# Patient Record
Sex: Female | Born: 1945
Health system: Southern US, Community
[De-identification: ages and names within clinical notes are randomized; demographics above are authoritative.]

## PROBLEM LIST (undated history)

## (undated) DIAGNOSIS — D649 Anemia, unspecified: Secondary | ICD-10-CM

## (undated) DIAGNOSIS — E785 Hyperlipidemia, unspecified: Secondary | ICD-10-CM

## (undated) DIAGNOSIS — F32A Depression, unspecified: Secondary | ICD-10-CM

## (undated) DIAGNOSIS — F329 Major depressive disorder, single episode, unspecified: Secondary | ICD-10-CM

## (undated) DIAGNOSIS — K3184 Gastroparesis: Secondary | ICD-10-CM

## (undated) DIAGNOSIS — C50919 Malignant neoplasm of unspecified site of unspecified female breast: Secondary | ICD-10-CM

## (undated) DIAGNOSIS — M1990 Unspecified osteoarthritis, unspecified site: Secondary | ICD-10-CM

## (undated) DIAGNOSIS — C229 Malignant neoplasm of liver, not specified as primary or secondary: Secondary | ICD-10-CM

## (undated) DIAGNOSIS — Z5189 Encounter for other specified aftercare: Secondary | ICD-10-CM

## (undated) DIAGNOSIS — K219 Gastro-esophageal reflux disease without esophagitis: Secondary | ICD-10-CM

## (undated) DIAGNOSIS — E119 Type 2 diabetes mellitus without complications: Secondary | ICD-10-CM

## (undated) DIAGNOSIS — G3184 Mild cognitive impairment, so stated: Secondary | ICD-10-CM

## (undated) DIAGNOSIS — Z85528 Personal history of other malignant neoplasm of kidney: Secondary | ICD-10-CM

## (undated) DIAGNOSIS — Z86010 Personal history of colonic polyps: Secondary | ICD-10-CM

## (undated) DIAGNOSIS — G709 Myoneural disorder, unspecified: Secondary | ICD-10-CM

## (undated) DIAGNOSIS — F419 Anxiety disorder, unspecified: Secondary | ICD-10-CM

## (undated) DIAGNOSIS — H269 Unspecified cataract: Secondary | ICD-10-CM

## (undated) DIAGNOSIS — Z853 Personal history of malignant neoplasm of breast: Secondary | ICD-10-CM

## (undated) DIAGNOSIS — R0902 Hypoxemia: Secondary | ICD-10-CM

## (undated) DIAGNOSIS — D3A090 Benign carcinoid tumor of the bronchus and lung: Secondary | ICD-10-CM

## (undated) DIAGNOSIS — T7840XA Allergy, unspecified, initial encounter: Secondary | ICD-10-CM

## (undated) DIAGNOSIS — K5790 Diverticulosis of intestine, part unspecified, without perforation or abscess without bleeding: Secondary | ICD-10-CM

## (undated) DIAGNOSIS — J189 Pneumonia, unspecified organism: Secondary | ICD-10-CM

## (undated) DIAGNOSIS — Z860101 Personal history of adenomatous and serrated colon polyps: Secondary | ICD-10-CM

## (undated) DIAGNOSIS — R0600 Dyspnea, unspecified: Secondary | ICD-10-CM

## (undated) DIAGNOSIS — R911 Solitary pulmonary nodule: Secondary | ICD-10-CM

## (undated) DIAGNOSIS — J449 Chronic obstructive pulmonary disease, unspecified: Secondary | ICD-10-CM

## (undated) DIAGNOSIS — Z8589 Personal history of malignant neoplasm of other organs and systems: Secondary | ICD-10-CM

## (undated) HISTORY — DX: Benign carcinoid tumor of the bronchus and lung: D3A.090

## (undated) HISTORY — DX: Allergy, unspecified, initial encounter: T78.40XA

## (undated) HISTORY — DX: Encounter for other specified aftercare: Z51.89

## (undated) HISTORY — DX: Anemia, unspecified: D64.9

## (undated) HISTORY — DX: Depression, unspecified: F32.A

## (undated) HISTORY — DX: Unspecified cataract: H26.9

## (undated) HISTORY — DX: Chronic obstructive pulmonary disease, unspecified: J44.9

## (undated) HISTORY — DX: Solitary pulmonary nodule: R91.1

## (undated) HISTORY — DX: Major depressive disorder, single episode, unspecified: F32.9

## (undated) HISTORY — DX: Unspecified osteoarthritis, unspecified site: M19.90

## (undated) HISTORY — PX: POLYPECTOMY: SHX149

## (undated) HISTORY — DX: Hyperlipidemia, unspecified: E78.5

## (undated) HISTORY — DX: Anxiety disorder, unspecified: F41.9

## (undated) HISTORY — DX: Hypoxemia: R09.02

## (undated) HISTORY — DX: Personal history of malignant neoplasm of breast: Z85.3

## (undated) HISTORY — PX: COLONOSCOPY: SHX174

## (undated) HISTORY — DX: Gastroparesis: K31.84

## (undated) HISTORY — DX: Diverticulosis of intestine, part unspecified, without perforation or abscess without bleeding: K57.90

## (undated) HISTORY — DX: Personal history of adenomatous and serrated colon polyps: Z86.0101

## (undated) HISTORY — DX: Mild cognitive impairment of uncertain or unknown etiology: G31.84

## (undated) HISTORY — DX: Personal history of colonic polyps: Z86.010

## (undated) HISTORY — DX: Gastro-esophageal reflux disease without esophagitis: K21.9

## (undated) HISTORY — DX: Myoneural disorder, unspecified: G70.9

## (undated) HISTORY — DX: Malignant neoplasm of unspecified site of unspecified female breast: C50.919

## (undated) HISTORY — DX: Type 2 diabetes mellitus without complications: E11.9

---
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
## 1973-03-08 HISTORY — PX: CHOLECYSTECTOMY: SHX55

## 1989-03-08 DIAGNOSIS — D3A090 Benign carcinoid tumor of the bronchus and lung: Secondary | ICD-10-CM

## 1989-03-08 HISTORY — DX: Benign carcinoid tumor of the bronchus and lung: D3A.090

## 1992-03-08 HISTORY — PX: LUNG SURGERY: SHX703

## 1992-10-30 ENCOUNTER — Encounter: Payer: Self-pay | Admitting: Internal Medicine

## 1996-03-08 HISTORY — PX: BREAST LUMPECTOMY: SHX2

## 2002-02-05 ENCOUNTER — Encounter: Payer: Self-pay | Admitting: Internal Medicine

## 2002-02-12 ENCOUNTER — Encounter: Payer: Self-pay | Admitting: Internal Medicine

## 2003-12-09 ENCOUNTER — Ambulatory Visit: Payer: Self-pay | Admitting: General Surgery

## 2003-12-11 ENCOUNTER — Ambulatory Visit: Payer: Self-pay | Admitting: General Surgery

## 2003-12-31 ENCOUNTER — Encounter: Admission: RE | Admit: 2003-12-31 | Discharge: 2003-12-31 | Payer: Self-pay | Admitting: General Surgery

## 2004-03-25 ENCOUNTER — Ambulatory Visit: Payer: Self-pay | Admitting: Oncology

## 2004-04-08 ENCOUNTER — Ambulatory Visit: Payer: Self-pay | Admitting: Oncology

## 2004-07-10 ENCOUNTER — Encounter: Payer: Self-pay | Admitting: Internal Medicine

## 2004-12-10 ENCOUNTER — Encounter: Admission: RE | Admit: 2004-12-10 | Discharge: 2004-12-10 | Payer: Self-pay | Admitting: Internal Medicine

## 2004-12-29 ENCOUNTER — Ambulatory Visit: Payer: Self-pay | Admitting: Internal Medicine

## 2004-12-31 ENCOUNTER — Encounter: Payer: Self-pay | Admitting: Internal Medicine

## 2004-12-31 ENCOUNTER — Ambulatory Visit (HOSPITAL_COMMUNITY): Admission: RE | Admit: 2004-12-31 | Discharge: 2004-12-31 | Payer: Self-pay | Admitting: Internal Medicine

## 2005-01-05 ENCOUNTER — Ambulatory Visit: Payer: Self-pay | Admitting: Internal Medicine

## 2005-01-05 ENCOUNTER — Other Ambulatory Visit: Admission: RE | Admit: 2005-01-05 | Discharge: 2005-01-05 | Payer: Self-pay | Admitting: Internal Medicine

## 2005-01-06 ENCOUNTER — Encounter: Payer: Self-pay | Admitting: Internal Medicine

## 2005-01-06 ENCOUNTER — Encounter (INDEPENDENT_AMBULATORY_CARE_PROVIDER_SITE_OTHER): Payer: Self-pay | Admitting: Specialist

## 2005-04-12 ENCOUNTER — Ambulatory Visit: Payer: Self-pay | Admitting: Oncology

## 2005-05-06 ENCOUNTER — Ambulatory Visit: Payer: Self-pay | Admitting: Oncology

## 2005-06-06 ENCOUNTER — Ambulatory Visit: Payer: Self-pay | Admitting: Oncology

## 2005-07-05 ENCOUNTER — Encounter: Payer: Self-pay | Admitting: Internal Medicine

## 2005-12-13 ENCOUNTER — Encounter: Payer: Self-pay | Admitting: Internal Medicine

## 2005-12-13 ENCOUNTER — Encounter: Admission: RE | Admit: 2005-12-13 | Discharge: 2005-12-13 | Payer: Self-pay | Admitting: Internal Medicine

## 2006-01-04 ENCOUNTER — Ambulatory Visit: Payer: Self-pay | Admitting: Internal Medicine

## 2006-01-26 ENCOUNTER — Ambulatory Visit: Payer: Self-pay | Admitting: Internal Medicine

## 2006-02-07 ENCOUNTER — Encounter (INDEPENDENT_AMBULATORY_CARE_PROVIDER_SITE_OTHER): Payer: Self-pay | Admitting: Specialist

## 2006-02-07 ENCOUNTER — Ambulatory Visit: Payer: Self-pay | Admitting: Internal Medicine

## 2006-12-14 ENCOUNTER — Encounter: Payer: Self-pay | Admitting: Internal Medicine

## 2006-12-15 ENCOUNTER — Encounter: Admission: RE | Admit: 2006-12-15 | Discharge: 2006-12-15 | Payer: Self-pay | Admitting: Internal Medicine

## 2007-11-20 ENCOUNTER — Encounter: Payer: Self-pay | Admitting: Internal Medicine

## 2007-12-04 ENCOUNTER — Encounter: Payer: Self-pay | Admitting: Internal Medicine

## 2007-12-15 ENCOUNTER — Emergency Department: Payer: Self-pay | Admitting: Emergency Medicine

## 2007-12-15 ENCOUNTER — Encounter: Payer: Self-pay | Admitting: Internal Medicine

## 2007-12-15 ENCOUNTER — Other Ambulatory Visit: Payer: Self-pay

## 2007-12-18 ENCOUNTER — Encounter: Admission: RE | Admit: 2007-12-18 | Discharge: 2007-12-18 | Payer: Self-pay | Admitting: Internal Medicine

## 2007-12-19 ENCOUNTER — Encounter: Payer: Self-pay | Admitting: Internal Medicine

## 2007-12-19 ENCOUNTER — Telehealth: Payer: Self-pay | Admitting: Internal Medicine

## 2007-12-20 ENCOUNTER — Telehealth (INDEPENDENT_AMBULATORY_CARE_PROVIDER_SITE_OTHER): Payer: Self-pay | Admitting: *Deleted

## 2007-12-22 ENCOUNTER — Ambulatory Visit: Payer: Self-pay | Admitting: Internal Medicine

## 2007-12-22 DIAGNOSIS — J309 Allergic rhinitis, unspecified: Secondary | ICD-10-CM | POA: Insufficient documentation

## 2007-12-22 DIAGNOSIS — J984 Other disorders of lung: Secondary | ICD-10-CM | POA: Insufficient documentation

## 2007-12-22 DIAGNOSIS — Z853 Personal history of malignant neoplasm of breast: Secondary | ICD-10-CM

## 2007-12-22 DIAGNOSIS — E785 Hyperlipidemia, unspecified: Secondary | ICD-10-CM | POA: Insufficient documentation

## 2007-12-22 DIAGNOSIS — J45909 Unspecified asthma, uncomplicated: Secondary | ICD-10-CM | POA: Insufficient documentation

## 2007-12-22 DIAGNOSIS — F39 Unspecified mood [affective] disorder: Secondary | ICD-10-CM

## 2007-12-22 DIAGNOSIS — F411 Generalized anxiety disorder: Secondary | ICD-10-CM | POA: Insufficient documentation

## 2007-12-22 DIAGNOSIS — K219 Gastro-esophageal reflux disease without esophagitis: Secondary | ICD-10-CM

## 2007-12-22 DIAGNOSIS — Z860100 Personal history of colon polyps, unspecified: Secondary | ICD-10-CM | POA: Insufficient documentation

## 2008-01-03 ENCOUNTER — Ambulatory Visit: Payer: Self-pay | Admitting: Internal Medicine

## 2008-01-03 DIAGNOSIS — J8410 Pulmonary fibrosis, unspecified: Secondary | ICD-10-CM

## 2008-02-15 ENCOUNTER — Ambulatory Visit: Payer: Self-pay | Admitting: Internal Medicine

## 2008-02-15 DIAGNOSIS — J449 Chronic obstructive pulmonary disease, unspecified: Secondary | ICD-10-CM

## 2008-04-19 ENCOUNTER — Ambulatory Visit: Payer: Self-pay | Admitting: Internal Medicine

## 2008-05-07 ENCOUNTER — Ambulatory Visit: Payer: Self-pay | Admitting: Internal Medicine

## 2008-05-08 LAB — CONVERTED CEMR LAB
ALT: 15 units/L (ref 0–35)
AST: 18 units/L (ref 0–37)
Albumin: 3.9 g/dL (ref 3.5–5.2)
Alkaline Phosphatase: 49 units/L (ref 39–117)
BUN: 10 mg/dL (ref 6–23)
Basophils Absolute: 0 10*3/uL (ref 0.0–0.1)
Basophils Relative: 0.5 % (ref 0.0–3.0)
Bilirubin, Direct: 0.1 mg/dL (ref 0.0–0.3)
CO2: 32 meq/L (ref 19–32)
Calcium: 9.8 mg/dL (ref 8.4–10.5)
Chloride: 104 meq/L (ref 96–112)
Cholesterol: 129 mg/dL (ref 0–200)
Creatinine, Ser: 0.8 mg/dL (ref 0.4–1.2)
Creatinine,U: 15.2 mg/dL
Eosinophils Absolute: 0.1 10*3/uL (ref 0.0–0.7)
Eosinophils Relative: 1.5 % (ref 0.0–5.0)
GFR calc Af Amer: 93 mL/min
GFR calc non Af Amer: 77 mL/min
Glucose, Bld: 109 mg/dL — ABNORMAL HIGH (ref 70–99)
HCT: 39 % (ref 36.0–46.0)
HDL: 43 mg/dL (ref >39.0)
Hemoglobin: 13.4 g/dL (ref 12.0–15.0)
Hgb A1c MFr Bld: 6.1 % — ABNORMAL HIGH (ref 4.6–6.0)
LDL Cholesterol: 68 mg/dL (ref 0–99)
Lymphocytes Relative: 33.5 % (ref 12.0–46.0)
MCHC: 34.3 g/dL (ref 30.0–36.0)
MCV: 92 fL (ref 78.0–100.0)
Microalb Creat Ratio: 13.2 mg/g (ref 0.0–30.0)
Microalb, Ur: 0.2 mg/dL (ref 0.0–1.9)
Monocytes Absolute: 0.6 10*3/uL (ref 0.1–1.0)
Monocytes Relative: 6.8 % (ref 3.0–12.0)
Neutro Abs: 4.8 10*3/uL (ref 1.4–7.7)
Neutrophils Relative %: 57.7 % (ref 43.0–77.0)
Phosphorus: 4.4 mg/dL (ref 2.3–4.6)
Platelets: 262 10*3/uL (ref 150–400)
Potassium: 4.2 meq/L (ref 3.5–5.1)
RBC: 4.24 M/uL (ref 3.87–5.11)
RDW: 11.9 % (ref 11.5–14.6)
Sodium: 143 meq/L (ref 135–145)
TSH: 2.19 microintl units/mL (ref 0.35–5.50)
Total Bilirubin: 0.8 mg/dL (ref 0.3–1.2)
Total CHOL/HDL Ratio: 3
Total Protein: 7.4 g/dL (ref 6.0–8.3)
Triglycerides: 92 mg/dL (ref 0–149)
VLDL: 18 mg/dL (ref 0–40)
WBC: 8.3 10*3/uL (ref 4.5–10.5)

## 2008-05-14 ENCOUNTER — Telehealth: Payer: Self-pay | Admitting: Internal Medicine

## 2008-05-20 ENCOUNTER — Ambulatory Visit: Payer: Self-pay | Admitting: Internal Medicine

## 2008-08-01 ENCOUNTER — Telehealth: Payer: Self-pay | Admitting: Internal Medicine

## 2008-08-08 ENCOUNTER — Encounter: Payer: Self-pay | Admitting: Internal Medicine

## 2008-08-29 ENCOUNTER — Ambulatory Visit: Payer: Self-pay | Admitting: Family Medicine

## 2008-08-29 DIAGNOSIS — J209 Acute bronchitis, unspecified: Secondary | ICD-10-CM | POA: Insufficient documentation

## 2008-11-12 ENCOUNTER — Ambulatory Visit: Payer: Self-pay | Admitting: Internal Medicine

## 2008-11-13 LAB — CONVERTED CEMR LAB
ALT: 17 units/L (ref 0–35)
AST: 22 units/L (ref 0–37)
Albumin: 4 g/dL (ref 3.5–5.2)
Alkaline Phosphatase: 53 units/L (ref 39–117)
BUN: 6 mg/dL (ref 6–23)
Basophils Absolute: 0.1 10*3/uL (ref 0.0–0.1)
Basophils Relative: 0.9 % (ref 0.0–3.0)
Bilirubin, Direct: 0 mg/dL (ref 0.0–0.3)
CO2: 30 meq/L (ref 19–32)
Calcium: 9.5 mg/dL (ref 8.4–10.5)
Chloride: 104 meq/L (ref 96–112)
Cholesterol: 138 mg/dL (ref 0–200)
Creatinine, Ser: 0.8 mg/dL (ref 0.4–1.2)
Creatinine,U: 16.9 mg/dL
Eosinophils Absolute: 0.2 10*3/uL (ref 0.0–0.7)
Eosinophils Relative: 2.7 % (ref 0.0–5.0)
Glucose, Bld: 94 mg/dL (ref 70–99)
HCT: 40.7 % (ref 36.0–46.0)
HDL: 46.7 mg/dL (ref >39.00)
Hemoglobin: 13.4 g/dL (ref 12.0–15.0)
Hgb A1c MFr Bld: 6.2 % (ref 4.6–6.5)
LDL Cholesterol: 74 mg/dL (ref 0–99)
Lymphocytes Relative: 44.2 % (ref 12.0–46.0)
Lymphs Abs: 3.1 10*3/uL (ref 0.7–4.0)
MCHC: 32.9 g/dL (ref 30.0–36.0)
MCV: 90 fL (ref 78.0–100.0)
Microalb Creat Ratio: 11.8 mg/g (ref 0.0–30.0)
Microalb, Ur: 0.2 mg/dL (ref 0.0–1.9)
Monocytes Absolute: 0.5 10*3/uL (ref 0.1–1.0)
Monocytes Relative: 6.9 % (ref 3.0–12.0)
Neutro Abs: 3.2 10*3/uL (ref 1.4–7.7)
Neutrophils Relative %: 45.3 % (ref 43.0–77.0)
Phosphorus: 4.4 mg/dL (ref 2.3–4.6)
Platelets: 222 10*3/uL (ref 150.0–400.0)
Potassium: 3.9 meq/L (ref 3.5–5.1)
RBC: 4.52 M/uL (ref 3.87–5.11)
RDW: 13.2 % (ref 11.5–14.6)
Sodium: 144 meq/L (ref 135–145)
TSH: 2.02 microintl units/mL (ref 0.35–5.50)
Total Bilirubin: 0.7 mg/dL (ref 0.3–1.2)
Total CHOL/HDL Ratio: 3
Total Protein: 6.8 g/dL (ref 6.0–8.3)
Triglycerides: 85 mg/dL (ref 0.0–149.0)
VLDL: 17 mg/dL (ref 0.0–40.0)
WBC: 7.1 10*3/uL (ref 4.5–10.5)

## 2008-12-18 ENCOUNTER — Encounter: Admission: RE | Admit: 2008-12-18 | Discharge: 2008-12-18 | Payer: Self-pay | Admitting: Internal Medicine

## 2008-12-19 ENCOUNTER — Encounter: Payer: Self-pay | Admitting: Internal Medicine

## 2008-12-23 ENCOUNTER — Ambulatory Visit: Payer: Self-pay | Admitting: Gastroenterology

## 2008-12-23 ENCOUNTER — Telehealth: Payer: Self-pay | Admitting: Internal Medicine

## 2008-12-23 DIAGNOSIS — K5730 Diverticulosis of large intestine without perforation or abscess without bleeding: Secondary | ICD-10-CM | POA: Insufficient documentation

## 2008-12-23 DIAGNOSIS — K3184 Gastroparesis: Secondary | ICD-10-CM

## 2008-12-23 DIAGNOSIS — K5900 Constipation, unspecified: Secondary | ICD-10-CM | POA: Insufficient documentation

## 2008-12-23 LAB — CONVERTED CEMR LAB
ALT: 16 units/L (ref 0–35)
AST: 28 units/L (ref 0–37)
Albumin: 3.8 g/dL (ref 3.5–5.2)
Alkaline Phosphatase: 61 units/L (ref 39–117)
BUN: 15 mg/dL (ref 6–23)
Basophils Absolute: 0.2 10*3/uL — ABNORMAL HIGH (ref 0.0–0.1)
Basophils Relative: 0.9 % (ref 0.0–3.0)
Bilirubin Urine: NEGATIVE
CO2: 31 meq/L (ref 19–32)
Calcium: 8.9 mg/dL (ref 8.4–10.5)
Chloride: 96 meq/L (ref 96–112)
Creatinine, Ser: 1 mg/dL (ref 0.4–1.2)
Eosinophils Absolute: 0.1 10*3/uL (ref 0.0–0.7)
Eosinophils Relative: 0.4 % (ref 0.0–5.0)
GFR calc non Af Amer: 59.07 mL/min (ref >60)
Glucose, Bld: 149 mg/dL — ABNORMAL HIGH (ref 70–99)
HCT: 42.3 % (ref 36.0–46.0)
Hemoglobin, Urine: NEGATIVE
Hemoglobin: 14.4 g/dL (ref 12.0–15.0)
Ketones, ur: NEGATIVE mg/dL
Leukocytes, UA: NEGATIVE
Lymphocytes Relative: 23.4 % (ref 12.0–46.0)
Lymphs Abs: 3.9 10*3/uL (ref 0.7–4.0)
MCHC: 33.9 g/dL (ref 30.0–36.0)
MCV: 89 fL (ref 78.0–100.0)
Monocytes Absolute: 1.8 10*3/uL — ABNORMAL HIGH (ref 0.1–1.0)
Monocytes Relative: 10.8 % (ref 3.0–12.0)
Neutro Abs: 10.7 10*3/uL — ABNORMAL HIGH (ref 1.4–7.7)
Neutrophils Relative %: 64.5 % (ref 43.0–77.0)
Nitrite: NEGATIVE
Platelets: 216 10*3/uL (ref 150.0–400.0)
Potassium: 4.3 meq/L (ref 3.5–5.1)
RBC: 4.75 M/uL (ref 3.87–5.11)
RDW: 13.2 % (ref 11.5–14.6)
Sodium: 138 meq/L (ref 135–145)
Specific Gravity, Urine: >=1.03 (ref 1.000–1.030)
Total Bilirubin: 1 mg/dL (ref 0.3–1.2)
Total Protein, Urine: NEGATIVE mg/dL
Total Protein: 7.8 g/dL (ref 6.0–8.3)
Urine Glucose: NEGATIVE mg/dL
Urobilinogen, UA: 0.2 (ref 0.0–1.0)
WBC: 16.7 10*3/uL — ABNORMAL HIGH (ref 4.5–10.5)
pH: 5.5 (ref 5.0–8.0)

## 2008-12-24 ENCOUNTER — Encounter: Payer: Self-pay | Admitting: Physician Assistant

## 2008-12-24 ENCOUNTER — Ambulatory Visit: Payer: Self-pay | Admitting: Cardiology

## 2008-12-26 ENCOUNTER — Ambulatory Visit: Payer: Self-pay | Admitting: Internal Medicine

## 2008-12-27 ENCOUNTER — Ambulatory Visit: Payer: Self-pay | Admitting: Internal Medicine

## 2008-12-27 ENCOUNTER — Telehealth (INDEPENDENT_AMBULATORY_CARE_PROVIDER_SITE_OTHER): Payer: Self-pay | Admitting: *Deleted

## 2008-12-27 DIAGNOSIS — R930 Abnormal findings on diagnostic imaging of skull and head, not elsewhere classified: Secondary | ICD-10-CM

## 2009-01-01 ENCOUNTER — Encounter: Payer: Self-pay | Admitting: Internal Medicine

## 2009-01-03 ENCOUNTER — Ambulatory Visit: Payer: Self-pay | Admitting: Internal Medicine

## 2009-01-06 HISTORY — PX: LAPAROSCOPIC NEPHRECTOMY: SUR781

## 2009-01-09 ENCOUNTER — Ambulatory Visit (HOSPITAL_COMMUNITY): Admission: RE | Admit: 2009-01-09 | Discharge: 2009-01-09 | Payer: Self-pay | Admitting: Urology

## 2009-01-09 ENCOUNTER — Encounter (INDEPENDENT_AMBULATORY_CARE_PROVIDER_SITE_OTHER): Payer: Self-pay | Admitting: Interventional Radiology

## 2009-01-10 ENCOUNTER — Encounter: Payer: Self-pay | Admitting: Internal Medicine

## 2009-01-14 ENCOUNTER — Telehealth: Payer: Self-pay | Admitting: Internal Medicine

## 2009-01-16 ENCOUNTER — Encounter: Payer: Self-pay | Admitting: Internal Medicine

## 2009-01-20 LAB — CONVERTED CEMR LAB: 5-HIAA, 24 Hr Urine: 6.1 mg/(24.h) — ABNORMAL HIGH (ref <=6.0)

## 2009-01-22 ENCOUNTER — Encounter: Payer: Self-pay | Admitting: Urology

## 2009-01-22 ENCOUNTER — Inpatient Hospital Stay (HOSPITAL_COMMUNITY): Admission: RE | Admit: 2009-01-22 | Discharge: 2009-01-27 | Payer: Self-pay | Admitting: Urology

## 2009-04-15 ENCOUNTER — Encounter: Payer: Self-pay | Admitting: Internal Medicine

## 2009-05-16 ENCOUNTER — Ambulatory Visit: Payer: Self-pay | Admitting: Internal Medicine

## 2009-05-16 DIAGNOSIS — E1140 Type 2 diabetes mellitus with diabetic neuropathy, unspecified: Secondary | ICD-10-CM

## 2009-05-16 DIAGNOSIS — M1990 Unspecified osteoarthritis, unspecified site: Secondary | ICD-10-CM | POA: Insufficient documentation

## 2009-05-19 LAB — CONVERTED CEMR LAB
ALT: 16 units/L (ref 0–35)
AST: 18 units/L (ref 0–37)
Albumin: 4 g/dL (ref 3.5–5.2)
Alkaline Phosphatase: 47 units/L (ref 39–117)
BUN: 12 mg/dL (ref 6–23)
Basophils Absolute: 0 10*3/uL (ref 0.0–0.1)
Basophils Relative: 0.7 % (ref 0.0–3.0)
Bilirubin, Direct: 0.1 mg/dL (ref 0.0–0.3)
CO2: 31 meq/L (ref 19–32)
Calcium: 10.1 mg/dL (ref 8.4–10.5)
Chloride: 109 meq/L (ref 96–112)
Cholesterol: 141 mg/dL (ref 0–200)
Creatinine, Ser: 1.2 mg/dL (ref 0.4–1.2)
Eosinophils Absolute: 0.2 10*3/uL (ref 0.0–0.7)
Eosinophils Relative: 2.7 % (ref 0.0–5.0)
GFR calc non Af Amer: 47.81 mL/min (ref >60)
Glucose, Bld: 108 mg/dL — ABNORMAL HIGH (ref 70–99)
HCT: 41.7 % (ref 36.0–46.0)
HDL: 48.7 mg/dL (ref >39.00)
Hemoglobin: 13.6 g/dL (ref 12.0–15.0)
Hgb A1c MFr Bld: 6.1 % (ref 4.6–6.5)
LDL Cholesterol: 70 mg/dL (ref 0–99)
Lymphocytes Relative: 41.3 % (ref 12.0–46.0)
Lymphs Abs: 2.7 10*3/uL (ref 0.7–4.0)
MCHC: 32.7 g/dL (ref 30.0–36.0)
MCV: 94 fL (ref 78.0–100.0)
Monocytes Absolute: 0.4 10*3/uL (ref 0.1–1.0)
Monocytes Relative: 6.7 % (ref 3.0–12.0)
Neutro Abs: 3.2 10*3/uL (ref 1.4–7.7)
Neutrophils Relative %: 48.6 % (ref 43.0–77.0)
Phosphorus: 4.1 mg/dL (ref 2.3–4.6)
Platelets: 216 10*3/uL (ref 150.0–400.0)
Potassium: 4.8 meq/L (ref 3.5–5.1)
RBC: 4.44 M/uL (ref 3.87–5.11)
RDW: 13.2 % (ref 11.5–14.6)
Sodium: 144 meq/L (ref 135–145)
TSH: 2.65 microintl units/mL (ref 0.35–5.50)
Total Bilirubin: 0.5 mg/dL (ref 0.3–1.2)
Total CHOL/HDL Ratio: 3
Total Protein: 7.1 g/dL (ref 6.0–8.3)
Triglycerides: 113 mg/dL (ref 0.0–149.0)
VLDL: 22.6 mg/dL (ref 0.0–40.0)
WBC: 6.5 10*3/uL (ref 4.5–10.5)

## 2009-06-19 ENCOUNTER — Telehealth: Payer: Self-pay | Admitting: Internal Medicine

## 2009-07-15 ENCOUNTER — Ambulatory Visit (HOSPITAL_COMMUNITY): Admission: RE | Admit: 2009-07-15 | Discharge: 2009-07-15 | Payer: Self-pay | Admitting: Urology

## 2009-07-15 ENCOUNTER — Encounter: Payer: Self-pay | Admitting: Internal Medicine

## 2009-08-05 ENCOUNTER — Encounter: Payer: Self-pay | Admitting: Internal Medicine

## 2009-08-05 ENCOUNTER — Ambulatory Visit: Payer: Self-pay | Admitting: Thoracic Surgery

## 2009-08-08 ENCOUNTER — Encounter: Payer: Self-pay | Admitting: Internal Medicine

## 2009-08-12 ENCOUNTER — Ambulatory Visit (HOSPITAL_COMMUNITY): Admission: RE | Admit: 2009-08-12 | Discharge: 2009-08-12 | Payer: Self-pay | Admitting: Thoracic Surgery

## 2009-08-20 ENCOUNTER — Encounter: Payer: Self-pay | Admitting: Internal Medicine

## 2009-08-20 ENCOUNTER — Ambulatory Visit: Payer: Self-pay | Admitting: Thoracic Surgery

## 2009-09-16 ENCOUNTER — Encounter: Payer: Self-pay | Admitting: Internal Medicine

## 2009-11-11 LAB — HM DIABETES EYE EXAM

## 2009-11-17 ENCOUNTER — Ambulatory Visit: Payer: Self-pay | Admitting: Internal Medicine

## 2009-11-17 DIAGNOSIS — F17200 Nicotine dependence, unspecified, uncomplicated: Secondary | ICD-10-CM

## 2009-11-18 ENCOUNTER — Encounter: Payer: Self-pay | Admitting: Internal Medicine

## 2009-11-18 ENCOUNTER — Encounter: Admission: RE | Admit: 2009-11-18 | Discharge: 2009-11-18 | Payer: Self-pay | Admitting: Thoracic Surgery

## 2009-11-18 ENCOUNTER — Ambulatory Visit: Payer: Self-pay | Admitting: Thoracic Surgery

## 2009-11-18 LAB — CONVERTED CEMR LAB
Cholesterol: 261 mg/dL — ABNORMAL HIGH (ref 0–200)
Direct LDL: 197.5 mg/dL
HDL: 41.9 mg/dL (ref >39.00)
Hgb A1c MFr Bld: 6.5 % (ref 4.6–6.5)
Total CHOL/HDL Ratio: 6
Triglycerides: 113 mg/dL (ref 0.0–149.0)
VLDL: 22.6 mg/dL (ref 0.0–40.0)
Vitamin B-12: 423 pg/mL (ref 211–911)

## 2009-12-19 ENCOUNTER — Encounter: Admission: RE | Admit: 2009-12-19 | Discharge: 2009-12-19 | Payer: Self-pay | Admitting: Internal Medicine

## 2009-12-22 ENCOUNTER — Encounter (INDEPENDENT_AMBULATORY_CARE_PROVIDER_SITE_OTHER): Payer: Self-pay | Admitting: *Deleted

## 2010-02-17 ENCOUNTER — Ambulatory Visit: Payer: Self-pay | Admitting: Internal Medicine

## 2010-03-20 ENCOUNTER — Ambulatory Visit
Admission: RE | Admit: 2010-03-20 | Discharge: 2010-03-20 | Payer: Self-pay | Source: Home / Self Care | Attending: Internal Medicine | Admitting: Internal Medicine

## 2010-03-20 ENCOUNTER — Encounter: Payer: Self-pay | Admitting: Internal Medicine

## 2010-04-07 NOTE — Letter (Signed)
Summary: [facility]  [facility]   Imported By: Lester [place] 08/25/2009 11:58:43  _____________________________________________________________________  External Attachment:    Type:   Image     Comment:   External Document  Appended Document: [facility] checking PET scan for enlargening pulmonary nodules  could be recurrent carcinoid

## 2010-04-07 NOTE — Letter (Signed)
Summary: [facility]  [facility]   Imported By: Lanelle Bal 12/05/2009 09:26:06  _____________________________________________________________________  External Attachment:    Type:   Image     Comment:   External Document  Appended Document: [facility] no change in pulmonary nodules 6 month follow up

## 2010-04-07 NOTE — Letter (Signed)
Summary: [facility]  [facility]   Imported By: Lester [place] 09/01/2009 09:12:40  _____________________________________________________________________  External Attachment:    Type:   Image     Comment:   External Document  Appended Document: [facility] PET scan did not light up on lung nodules continued follow up probably not carcinoid related

## 2010-04-07 NOTE — Assessment & Plan Note (Signed)
Summary: ROA 6 MTHS CYD   Vital Signs:  Patient profile:   65 year old female Weight:      147 pounds Temp:     98.3 degrees F oral Pulse rate:   72 / minute Pulse rhythm:   regular BP sitting:   102 / 60  (left arm) Cuff size:   regular  Vitals Entered By: Mervin Hack CMA Duncan Dull) (May 16, 2009 7:58 AM) CC: 6 month follow-up   History of Present Illness: Had nausea so went to GI Ct showed renal mass Turned out to be RCC removed in November has done well Gi symptoms have resolved  On iron--not sure if she needs it discussed that her blood count is normal and she should stop Asks about the vitamin B12--no change in neuropathy on this Still needs gabapentin--not controlling pain during daytime as well Not sleepy with this  discussed no generic for proair wants to change to omeprazole ongoing GERD issues so needs to continue  Asks for zostavax Rx given  Pain in right index finger Occ stuck in flexion at PIP no meds in general--occ advil which helps Not really llmited in general  Diabetes control is okay Averages 105-114 in AM 116-125 in evening Checks once a week in general NO hypoglycemic reactions  Mood is fine on her lexapro no sig anxiety   Allergies: 1)  ! * Penicillin 2)  Symbicort (Budesonide-Formoterol Fumarate)  Past History:  Past medical, surgical, family and social histories (including risk factors) reviewed for relevance to current acute and chronic problems.  Past Medical History: Allergic rhinitis Asthma Diabetes mellitus, type II with neuropathy GERD HX Gastroparesis Diverticulosis Anxiety Depression Breast cancer, hx of Colonic polyps, hx -----adenomatous  2007 Hyperlipidemia COPD    - PFT's  2/07       FEV1 .79    - PFT's 02/15/08 FEV1 .97 (45%) ratio 49, 11% better after Beta 2, DLCO 48% Multiple Pulmonary Nodules     - CT 12/24/08 assoc with R  renal mass Renal cell carcinoma  11/10 Osteoarthritis  Past Surgical  History: Reviewed history from 04/22/2009 and no changes required. Cholecystectomy--1975 Tonsillectomy/A--1953denoidectomy Lumpectomy--right breast 1998 no positive nodes RT only Lung carcinoid removed (2)--1994 [facility]  Lap assisted right nephrectomy for RCC --11/10 ([facility])  Family History: Reviewed history from 12/26/2008 and no changes required. Dad died from colon and lung cancer @57  Mom died of CVA  ~62 Only child Doesn't know other family history No FH of Colon Cancer:  Social History: Reviewed history from 12/26/2008 and no changes required. Retired--Banking Married--2 daughters, both in the area Former Smoker--quit  ~1999 Alcohol use-no Daily Caffeine Use Illicit Drug Use - no Patient does not get regular exercise.   Review of Systems       sleeps well breathing okay weight down 3# has lost sense of taste and smell since surgery  Physical Exam  General:  alert and normal appearance.   Neck:  supple, no masses, no thyromegaly, no carotid bruits, and no cervical lymphadenopathy.   Lungs:  normal respiratory effort.   Slightly decreased breath sounds at bases but clear Heart:  normal rate, regular rhythm, no murmur, and no gallop.   Msk:  slight thickening in right 2nd PIP Normal ROM Pulses:  faint pulses Extremities:  no edema Skin:  no suspicious lesions and no ulcerations.   Psych:  normally interactive, good eye contact, not anxious appearing, and not depressed appearing.    Diabetes Management Exam:    Foot  Exam (with socks and/or shoes not present):       Sensory-Pinprick/Light touch:          Left medial foot (L-4): diminished          Left dorsal foot (L-5): diminished          Left lateral foot (S-1): diminished          Right medial foot (L-4): diminished          Right dorsal foot (L-5): diminished          Right lateral foot (S-1): diminished       Inspection:          Left foot: normal          Right foot: normal       Nails:           Left foot: normal          Right foot: normal   Impression & Recommendations:  Problem # 1:  DIABETES MELLITUS, TYPE II, WITH NEUROLOGICAL COMPLICATIONS (ICD-250.60) Assessment Unchanged  seems to still have good control neuropathy worsening some--will stick with current dose of gabapentin  Her updated medication list for this problem includes:    Metformin Hcl 1000 Mg Tabs (Metformin hcl) .Marland Kitchen... 1 twice a day by mouth    Bayer Aspirin Ec Low Dose 81 Mg Tbec (Aspirin) .Marland Kitchen... 1 daily by mouth  Labs Reviewed: Creat: 1.0 (12/23/2008)     Last Eye Exam: no retinopathy (10/28/2008) Reviewed HgBA1c results: 6.2 (11/12/2008)  6.1 (05/07/2008)  Orders: TLB-A1C / Hgb A1C (Glycohemoglobin) (83036-A1C) TLB-CBC Platelet - w/Differential (85025-CBCD) TLB-Renal Function Panel (80069-RENAL) TLB-TSH (Thyroid Stimulating Hormone) (84443-TSH) Venipuncture (04540)  Problem # 2:  OSTEOARTHRITIS (ICD-715.90) Assessment: New mild discussed heat and ibuprofen as needed only  Problem # 3:  HYPERLIPIDEMIA (ICD-272.4) Assessment: Unchanged  no problem with meds  Her updated medication list for this problem includes:    Simvastatin 40 Mg Tabs (Simvastatin) .Marland Kitchen... 1 at bedtime  Labs Reviewed: SGOT: 28 (12/23/2008)   SGPT: 16 (12/23/2008)   HDL:46.70 (11/12/2008), 43.0 (05/07/2008)  LDL:74 (11/12/2008), 68 (05/07/2008)  Chol:138 (11/12/2008), 129 (05/07/2008)  Trig:85.0 (11/12/2008), 92 (05/07/2008)  Orders: TLB-Lipid Panel (80061-LIPID) TLB-Hepatic/Liver Function Pnl (80076-HEPATIC)  Problem # 4:  DEPRESSION (ICD-311) Assessment: Unchanged doing well with med  Her updated medication list for this problem includes:    Lexapro 10 Mg Tabs (Escitalopram oxalate) .Marland Kitchen... 1 once daily by mouth as needed  Complete Medication List: 1)  Omeprazole 20 Mg Cpdr (Omeprazole) .Marland Kitchen.. 1 daily for acid reflux 2)  Proair Hfa 108 (90 Base) Mcg/act Aers (Albuterol sulfate) .... 2 puffs up to 4 times a day as  needed 3)  Fexofenadine Hcl 180 Mg Tabs (Fexofenadine hcl) .... Take 1 tablet by mouth once daily 4)  Gabapentin 600 Mg Tabs (Gabapentin) .... Take 1 tablet by mouth three times a day and 1/2 tab at bedtime 5)  Simvastatin 40 Mg Tabs (Simvastatin) .Marland Kitchen.. 1 at bedtime 6)  Metformin Hcl 1000 Mg Tabs (Metformin hcl) .Marland Kitchen.. 1 twice a day by mouth 7)  Spiriva Handihaler 18 Mcg Caps (Tiotropium bromide monohydrate) .Marland Kitchen.. 1 inhalation daily 8)  Lexapro 10 Mg Tabs (Escitalopram oxalate) .Marland Kitchen.. 1 once daily by mouth as needed 9)  Multivitamins Caps (Multiple vitamin) .Marland Kitchen.. 1 daily by mouth 10)  Bayer Aspirin Ec Low Dose 81 Mg Tbec (Aspirin) .Marland Kitchen.. 1 daily by mouth 11)  Glucosamine 1500 Complex Caps (Glucosamine-chondroit-vit c-mn) .Marland Kitchen.. 1 daily by mouth 12)  Freestyle  Test Strp (Glucose blood) .... Check sugar daily  icd-9 code 250.00 13)  Fish Oil 1000 Mg Caps (Omega-3 fatty acids) .... Once daily 14)  Vitamin D 1000 Unit Tabs (Cholecalciferol) .Marland Kitchen.. 1 tab daily  Patient Instructions: 1)  Please schedule a follow-up appointment in 6 months .  Prescriptions: FREESTYLE TEST  STRP (GLUCOSE BLOOD) CHECK SUGAR DAILY  ICD-9 code 250.00  #100 x 3   Entered by:   Mervin Hack CMA (AAMA)   Authorized by:   Cindee Salt MD   Signed by:   Mervin Hack CMA (AAMA) on 05/16/2009   Method used:   Print then Give to Patient   RxID:   [id number] LEXAPRO 10 MG TABS (ESCITALOPRAM OXALATE) 1 once daily by mouth as needed  #90 x 3   Entered by:   Mervin Hack CMA (AAMA)   Authorized by:   Cindee Salt MD   Signed by:   Mervin Hack CMA (AAMA) on 05/16/2009   Method used:   Print then Give to Patient   RxID:   [id number] GLUCOSAMINE 1500 COMPLEX  CAPS (GLUCOSAMINE-CHONDROIT-VIT C-MN) 1 daily by mouth  #90 x 3   Entered by:   Mervin Hack CMA (AAMA)   Authorized by:   Cindee Salt MD   Signed by:   Mervin Hack CMA (AAMA) on 05/16/2009   Method used:   Print then Give to  Patient   RxID:   [id number] SPIRIVA HANDIHALER 18 MCG CAPS (TIOTROPIUM BROMIDE MONOHYDRATE) 1 inhalation daily  #3 x 3   Entered by:   Mervin Hack CMA (AAMA)   Authorized by:   Cindee Salt MD   Signed by:   Mervin Hack CMA (AAMA) on 05/16/2009   Method used:   Print then Give to Patient   RxID:   [id number] METFORMIN HCL 1000 MG TABS (METFORMIN HCL) 1 twice a day by mouth  #180 x 3   Entered by:   Mervin Hack CMA (AAMA)   Authorized by:   Cindee Salt MD   Signed by:   Mervin Hack CMA (AAMA) on 05/16/2009   Method used:   Print then Give to Patient   RxID:   [id number] SIMVASTATIN 40 MG TABS (SIMVASTATIN) 1 at bedtime  #90 x 3   Entered by:   Mervin Hack CMA (AAMA)   Authorized by:   Cindee Salt MD   Signed by:   Mervin Hack CMA (AAMA) on 05/16/2009   Method used:   Print then Give to Patient   RxID:   [id number] GABAPENTIN 600 MG TABS (GABAPENTIN) take 1 tablet by mouth three times a day and 1/2 tab at bedtime  #315 x 3   Entered by:   Mervin Hack CMA (AAMA)   Authorized by:   Cindee Salt MD   Signed by:   Mervin Hack CMA (AAMA) on 05/16/2009   Method used:   Print then Give to Patient   RxID:   [id number] FEXOFENADINE HCL 180 MG TABS (FEXOFENADINE HCL) take 1 tablet by mouth once daily  #90 x 3   Entered by:   Mervin Hack CMA (AAMA)   Authorized by:   Cindee Salt MD   Signed by:   Mervin Hack CMA (AAMA) on 05/16/2009   Method used:   Print then Give to Patient   RxID:   [id number] PROAIR HFA 108 (90 BASE) MCG/ACT AERS (ALBUTEROL SULFATE) 2 puffs up to 4 times a day as needed  #  3 x 3   Entered by:   Mervin Hack CMA (AAMA)   Authorized by:   Cindee Salt MD   Signed by:   Mervin Hack CMA (AAMA) on 05/16/2009   Method used:   Print then Give to Patient   RxID:   [id number] OMEPRAZOLE 20 MG CPDR (OMEPRAZOLE) 1 daily for acid reflux  #90 x 3   Entered  by:   Mervin Hack CMA (AAMA)   Authorized by:   Cindee Salt MD   Signed by:   Mervin Hack CMA (AAMA) on 05/16/2009   Method used:   Print then Give to Patient   RxID:   [id number]   Current Allergies (reviewed today): ! * PENICILLIN SYMBICORT (BUDESONIDE-FORMOTEROL FUMARATE)

## 2010-04-07 NOTE — Letter (Signed)
Summary: Results Follow up Letter  [place] [facility]  [address] [place], [place] [address]   Phone: [phone]  Fax: [phone]    12/22/2009 MRN: [id number]  [facility] [address] [place], [place]  [address]  Dear [name],  The following are the results of your recent test(s):  Test         Result    Pap Smear:        Normal _____  Not Normal _____ Comments: ______________________________________________________ Cholesterol: LDL(Bad cholesterol):         Your goal is less than:         HDL (Good cholesterol):       Your goal is more than: Comments:  ______________________________________________________ Mammogram:        Normal __X___  Not Normal _____ Comments:mammo looks fine repeat recommended in 1-2 years  ___________________________________________________________________ Hemoccult:        Normal _____  Not normal _______ Comments:    _____________________________________________________________________ Other Tests:    We routinely do not discuss normal results over the telephone.  If you desire a copy of the results, or you have any questions about this information we can discuss them at your next office visit.   Sincerely,      Tillman Abide, MD

## 2010-04-07 NOTE — Progress Notes (Signed)
Summary: regarding lexapro dose  Phone Note From Pharmacy   Caller: [facility] Summary of Call: [facility] called regarding pt's directions on pt's lexapro.  Script reads to take one a day as needed, they asked if this should be one a day.  Advised yes, if that's not correct I will call them back. Initial call taken by: Lowella Petties CMA,  June 19, 2009 2:14 PM  Follow-up for Phone Call        Yes  should not have as needed on it I changed it on the med list Follow-up by: Cindee Salt MD,  June 19, 2009 2:20 PM    New/Updated Medications: LEXAPRO 10 MG TABS (ESCITALOPRAM OXALATE) 1 once daily by mouth

## 2010-04-07 NOTE — Assessment & Plan Note (Signed)
Summary: 6 MONTH FOLLO WUP/RBH   Vital Signs:  Patient profile:   65 year old female Weight:      143 pounds BMI:     24.63 Temp:     98.2 degrees F oral Pulse rate:   64 / minute Pulse rhythm:   regular BP sitting:   120 / 62  (left arm) Cuff size:   regular  Vitals Entered By: Mervin Hack CMA Duncan Dull) (November 17, 2009 10:24 AM) CC: 6 month follow-up   History of Present Illness: Had injection in right shoulder by Dr Hyacinth Meeker some better  Had recent PET scan for chest nodules has follow up and CT planned soon  Sees Dr Genia Hotter tomorrow follow up of RCC  Notes dry mouth Needs to drink water or something to suck on Actually affects her ability to talk Lips feel thick No new meds No regular dry eyes--though some irritation on left since dilated exam last week  Needs the allegra daily ENT put her on flonase does tend to have hay fever symptoms Breathing okay no sig cough  Checking sugars every other day or so usually 112-124 No hypoglycemic reactions SOme pain in feet---gabapentin does help No ulcers  Mood has been fine  Notes some worsening of memory Forgets things daughter told her unless she writes it down Hasn't gotten lost No apparent change in executive function or praxis Saw neurologist about 6 months ago--everything was okay  Preventive Screening-Counseling & Management  Alcohol-Tobacco     Smoking Status: current     Smoking Cessation Counseling: yes     Smoke Cessation Stage: ready     Packs/Day: 0.5     Tobacco Counseling: to quit use of tobacco products  Comments: has used wellbutrin in past patch not effective  Allergies: 1)  ! * Penicillin 2)  Symbicort (Budesonide-Formoterol Fumarate)  Past History:  Past medical, surgical, family and social histories (including risk factors) reviewed for relevance to current acute and chronic problems.  Past Medical History: Reviewed history from 05/16/2009 and no changes  required. Allergic rhinitis Asthma Diabetes mellitus, type II with neuropathy GERD HX Gastroparesis Diverticulosis Anxiety Depression Breast cancer, hx of Colonic polyps, hx -----adenomatous  2007 Hyperlipidemia COPD    - PFT's  2/07       FEV1 .79    - PFT's 02/15/08 FEV1 .97 (45%) ratio 49, 11% better after Beta 2, DLCO 48% Multiple Pulmonary Nodules     - CT 12/24/08 assoc with R  renal mass Renal cell carcinoma  11/10 Osteoarthritis  Past Surgical History: Reviewed history from 04/22/2009 and no changes required. Cholecystectomy--1975 Tonsillectomy/A--1953denoidectomy Lumpectomy--right breast 1998 no positive nodes RT only Lung carcinoid removed (2)--1994 [facility]  Lap assisted right nephrectomy for RCC --11/10 ([facility])  Family History: Reviewed history from 12/26/2008 and no changes required. Dad died from colon and lung cancer @57  Mom died of CVA  ~62 Only child Doesn't know other family history No FH of Colon Cancer:  Social History: Retired--Banking Married--2 daughters, both in the area Former Smoker--quit  ~1999 Restarted with stress in 6/11 Alcohol use-no Daily Caffeine Use Illicit Drug Use - no Patient does not get regular exercise.  Smoking Status:  current Packs/Day:  0.5  Review of Systems       Off the statin careflul with diet weight down 4# sleeps fairly well in general  Physical Exam  General:  alert and normal appearance.   Eyes:  pupils equal, pupils round, and no injection.   Mouth:  coating  on tongue no lesions Neck:  supple, no masses, no thyromegaly, no carotid bruits, and no cervical lymphadenopathy.   Lungs:  normal respiratory effort, no intercostal retractions, no accessory muscle use, and normal breath sounds.   Heart:  normal rate, regular rhythm, no murmur, and no gallop.   Pulses:  1+ in feet Extremities:  no edema Neurologic:  alert & oriented X3, strength normal in all extremities, and gait normal.   Presidents--  "Obama, Bush, Clinton" [id number]-l-r-[id number]-w Skin:  no rashes and no suspicious lesions.   Psych:  normally interactive, good eye contact, not anxious appearing, and not depressed appearing.    Diabetes Management Exam:    Foot Exam (with socks and/or shoes not present):       Sensory-Pinprick/Light touch:          Left medial foot (L-4): diminished          Left dorsal foot (L-5): diminished          Left lateral foot (S-1): diminished          Right medial foot (L-4): diminished          Right dorsal foot (L-5): diminished          Right lateral foot (S-1): diminished       Inspection:          Left foot: normal          Right foot: normal       Nails:          Left foot: normal          Right foot: normal    Eye Exam:       Eye Exam done elsewhere          Date: 11/11/2009          Results: No retinopathy, early cataracts          Done by: Dr Dingledein   Impression & Recommendations:  Problem # 1:  DIABETES MELLITUS, TYPE II, WITH NEUROLOGICAL COMPLICATIONS (ICD-250.60)  seems to still have good control  Her updated medication list for this problem includes:    Metformin Hcl 1000 Mg Tabs (Metformin hcl) .Marland Kitchen... 1 twice a day by mouth    Bayer Aspirin Ec Low Dose 81 Mg Tbec (Aspirin) .Marland Kitchen... 1 daily by mouth  Labs Reviewed: Creat: 1.2 (05/16/2009)     Last Eye Exam: No retinopathy, early cataracts (11/11/2009) Reviewed HgBA1c results: 6.1 (05/16/2009)  6.2 (11/12/2008)  Orders: TLB-A1C / Hgb A1C (Glycohemoglobin) (83036-A1C) TLB-B12, Serum-Total ONLY (93716-R67)  Problem # 2:  DEPRESSION (ICD-311) Assessment: Unchanged mood is okay shamed by restarting smoking  will try pharmacotherapy for this  Her updated medication list for this problem includes:    Lexapro 10 Mg Tabs (Escitalopram oxalate) .Marland Kitchen... 1 once daily by mouth    Bupropion Hcl 150 Mg Xr12h-tab (Bupropion hcl) .Marland Kitchen... 1 tab by mouth two times a day  Problem # 3:  ASTHMA (ICD-493.90) Assessment:  Unchanged breathing has been okay  Her updated medication list for this problem includes:    Proair Hfa 108 (90 Base) Mcg/act Aers (Albuterol sulfate) .Marland Kitchen... 2 puffs up to 4 times a day as needed    Spiriva Handihaler 18 Mcg Caps (Tiotropium bromide monohydrate) .Marland Kitchen... 1 inhalation daily  Problem # 4:  CIGARETTE SMOKER (ICD-305.1) Assessment: Comment Only  restarted counselled  ~5 minutes will start bupropion and recheck  Orders: Tobacco use cessation intermediate 3-10 minutes (89381)  Complete Medication List: 1)  Omeprazole  20 Mg Cpdr (Omeprazole) .Marland Kitchen.. 1 daily for acid reflux 2)  Proair Hfa 108 (90 Base) Mcg/act Aers (Albuterol sulfate) .... 2 puffs up to 4 times a day as needed 3)  Fexofenadine Hcl 180 Mg Tabs (Fexofenadine hcl) .... Take 1 tablet by mouth once daily 4)  Gabapentin 600 Mg Tabs (Gabapentin) .... Take 1 tablet by mouth three times a day and 1/2 tab at bedtime 5)  Metformin Hcl 1000 Mg Tabs (Metformin hcl) .Marland Kitchen.. 1 twice a day by mouth 6)  Spiriva Handihaler 18 Mcg Caps (Tiotropium bromide monohydrate) .Marland Kitchen.. 1 inhalation daily 7)  Lexapro 10 Mg Tabs (Escitalopram oxalate) .Marland Kitchen.. 1 once daily by mouth 8)  Fluticasone Propionate 50 Mcg/act Susp (Fluticasone propionate) .... 2 sprays in each nostril daily for allergies 9)  Multivitamins Caps (Multiple vitamin) .Marland Kitchen.. 1 daily by mouth 10)  Bayer Aspirin Ec Low Dose 81 Mg Tbec (Aspirin) .Marland Kitchen.. 1 daily by mouth 11)  Glucosamine 1500 Complex Caps (Glucosamine-chondroit-vit c-mn) .Marland Kitchen.. 1 daily by mouth 12)  Fish Oil 1000 Mg Caps (Omega-3 fatty acids) .... Once daily 13)  Vitamin D 1000 Unit Tabs (Cholecalciferol) .Marland Kitchen.. 1 tab daily 14)  Freestyle Test Strp (Glucose blood) .... Check sugar daily  icd-9 code 250.00 15)  First-dukes Mouthwash Susp (Diphenhyd-hydrocort-nystatin) .... 5cc swish and spit three times a day till tongue cleared up 16)  Bupropion Hcl 150 Mg Xr12h-tab (Bupropion hcl) .Marland Kitchen.. 1 tab by mouth two times a day  Other  Orders: TLB-Lipid Panel (80061-LIPID) Venipuncture (09323)  Patient Instructions: 1)  Please get records from [facility] from earlier this year 2)  Please start the bupropion at 1 pill daily for 3 days, then go up to two times a day. Stop smoking date will be 10 days after first starting the med 3)  Please schedule a follow-up appointment in 3 months .  Prescriptions: BUPROPION HCL 150 MG XR12H-TAB (BUPROPION HCL) 1 tab by mouth two times a day  #60 x 3   Entered and Authorized by:   Cindee Salt MD   Signed by:   Cindee Salt MD on 11/17/2009   Method used:   Electronically to        [facility] [address] [phone]* (retail)       [address] [place], [place]  [address]       Ph: [phone]       Fax: [phone]   RxID:   [id number] FIRST-DUKES MOUTHWASH  SUSP (DIPHENHYD-HYDROCORT-NYSTATIN) 5cc swish and spit three times a day till tongue cleared up  #8oz x 1   Entered and Authorized by:   Cindee Salt MD   Signed by:   Cindee Salt MD on 11/17/2009   Method used:   Electronically to        [facility] [address] [phone]* (retail)       [address] [place]  [address]       Ph: [phone]       Fax: [phone]   RxID:   [id number]   Current Allergies (reviewed today): ! * PENICILLIN SYMBICORT (BUDESONIDE-FORMOTEROL FUMARATE)   Immunization History:  Tetanus/Td Immunization History:    Tetanus/Td:  Tdap (12/22/2007)  Influenza Immunization History:    Influenza:  Historical (11/11/2009)  Pneumovax Immunization History:    Pneumovax:  Pneumovax (03/08/2005)  Zostavax History:    Zostavax # 1:  Zostavax (11/11/2009)

## 2010-04-07 NOTE — Letter (Signed)
Summary: Oletta Lamas MD  Oletta Lamas MD   Imported By: Lanelle Bal 09/23/2009 12:08:17  _____________________________________________________________________  External Attachment:    Type:   Image     Comment:   External Document  Appended Document: Oletta Lamas MD right shoulder injected  continuing mobic and muscle relaxer

## 2010-04-07 NOTE — Letter (Signed)
Summary: [facility]  [facility]   Imported By: Lanelle Bal 04/21/2009 08:00:50  _____________________________________________________________________  External Attachment:    Type:   Image     Comment:   External Document  Appended Document: [facility]     Clinical Lists Changes  Observations: Added new observation of PAST SURG HX: Cholecystectomy--1975 Tonsillectomy/A--1953denoidectomy Lumpectomy--right breast 1998 no positive nodes RT only Lung carcinoid removed (2)--1994 [facility]  Lap assisted right nephrectomy for RCC --11/10 (Dahlstedt) (04/22/2009 8:02) Added new observation of PAST MED HX: Allergic rhinitis Asthma Diabetes mellitus, type II with neuropathy GERD HX GASTROPARESIS DIVERTICULOSIS Anxiety Depression Breast cancer, hx of Colonic polyps, hx of/ADENOMATOUS 2007 Hyperlipidemia COPD    - PFT's  2/07       FEV1 .79    - PFT's 02/15/08 FEV1 .97 (45%) ratio 49, 11% better after Beta 2, DLCO 48% Multiple Pulmonary Nodules     - CT 12/24/08 assoc with R  renal mass Renal cell carcinoma  11/10 (04/22/2009 8:02)       Past History:  Past Medical History: Allergic rhinitis Asthma Diabetes mellitus, type II with neuropathy GERD HX GASTROPARESIS DIVERTICULOSIS Anxiety Depression Breast cancer, hx of Colonic polyps, hx of/ADENOMATOUS 2007 Hyperlipidemia COPD    - PFT's  2/07       FEV1 .79    - PFT's 02/15/08 FEV1 .97 (45%) ratio 49, 11% better after Beta 2, DLCO 48% Multiple Pulmonary Nodules     - CT 12/24/08 assoc with R  renal mass Renal cell carcinoma  11/10  Past Surgical History: Cholecystectomy--1975 Tonsillectomy/A--1953denoidectomy Lumpectomy--right breast 1998 no positive nodes RT only Lung carcinoid removed (2)--1994 [facility]  Lap assisted right nephrectomy for RCC --11/10 (Dahlstedt)

## 2010-04-07 NOTE — Letter (Signed)
Summary: [facility]  [facility]   Imported By: Lanelle Bal 07/18/2009 12:57:51  _____________________________________________________________________  External Attachment:    Type:   Image     Comment:   External Document  Appended Document: [facility] 6 months after RCC resection granular pattern on CXR---getting CT to evaluate

## 2010-04-07 NOTE — Letter (Signed)
Summary: [facility]   Imported By: Sherian Rein 11/25/2009 15:00:46  _____________________________________________________________________  External Attachment:    Type:   Image     Comment:   External Document  Appended Document: [facility] 1 year follow up after right nephrectomy for RCC repeating CT scan soon

## 2010-04-09 NOTE — Letter (Signed)
Summary: [facility]  [facility]   Imported By: Lester [place] 02/27/2010 09:26:29  _____________________________________________________________________  External Attachment:    Type:   Image     Comment:   External Document

## 2010-04-09 NOTE — Letter (Signed)
Summary: [facility]  [facility]   Imported By: Lester [place] 02/27/2010 09:40:45  _____________________________________________________________________  External Attachment:    Type:   Image     Comment:   External Document

## 2010-04-09 NOTE — Letter (Signed)
Summary: [facility]  [facility]   Imported By: Lester [place] 02/27/2010 09:29:57  _____________________________________________________________________  External Attachment:    Type:   Image     Comment:   External Document

## 2010-04-09 NOTE — Letter (Signed)
Summary: [facility]  [facility]   Imported By: Lester [place] 02/27/2010 09:27:56  _____________________________________________________________________  External Attachment:    Type:   Image     Comment:   External Document

## 2010-04-09 NOTE — Letter (Signed)
Summary: [facility]  [facility]   Imported By: Lester [place] 02/27/2010 09:24:05  _____________________________________________________________________  External Attachment:    Type:   Image     Comment:   External Document

## 2010-04-09 NOTE — Assessment & Plan Note (Signed)
Summary: COUGH/COLD/JBB   Vital Signs:  Patient Profile:   65 Years Old Female CC:      Cold & URI symptoms Height:     62.5 inches Weight:      142 pounds BMI:     25.65 O2 Sat:      95 % O2 treatment:    Room Air Temp:     97.0 degrees F oral Pulse rate:   72 / minute Pulse rhythm:   regular Resp:     22 per minute BP sitting:   138 / 80  (right arm)  Pt. in pain?   no  Vitals Entered By: Levonne Spiller EMT-P (March 20, 2010 11:20 AM)              Is Patient Diabetic? Yes   Does patient need assistance? Functional Status Self care Ambulation Normal      Current Allergies: ! * PENICILLIN SYMBICORT (BUDESONIDE-FORMOTEROL FUMARATE)History of Present Illness Chief Complaint: Cold & URI symptoms for 2 days History of Present Illness: patient had a head cold last week which has resolved. also had a abd ct in f/u of kidney tumor which "showed the lung nodule had not changed". Now patient has a productive cough of yellow sputum. Has some old prescription "hydro" cough syryp from last year that she has ran out of. She denies sob any worse than usual and hasn't been taking her as needed albuterol but is taking her spiriva. She denies chest pain, fever, chills, fatigue, aches more than usual. Old chart reviewed. pulse ox last year in routine exam 94% patient requests levaquin which was effective 1-2 years ago and declined cxr when offered.   REVIEW OF SYSTEMS Constitutional Symptoms      Denies fever, chills, night sweats, weight loss, weight gain, and fatigue.  Eyes       Denies change in vision, eye pain, eye discharge, glasses, contact lenses, and eye surgery. Ear/Nose/Throat/Mouth       Complains of hoarseness.      Denies hearing loss/aids, change in hearing, ear pain, ear discharge, dizziness, frequent runny nose, frequent nose bleeds, sinus problems, sore throat, and tooth pain or bleeding.  Respiratory       Complains of productive cough and wheezing.      Denies dry  cough, shortness of breath, asthma, bronchitis, and emphysema/COPD.      Comments: Colored Sputum Cardiovascular       Denies murmurs, chest pain, and tires easily with exhertion.    Gastrointestinal       Denies stomach pain, nausea/vomiting, diarrhea, constipation, blood in bowel movements, and indigestion. Genitourniary       Denies painful urination, blood or discharge from vagina, kidney stones, and loss of urinary control. Neurological       Denies paralysis, seizures, and fainting/blackouts. Musculoskeletal       Denies muscle pain, joint pain, joint stiffness, decreased range of motion, redness, swelling, muscle weakness, and gout.  Skin       Denies bruising, unusual mles/lumps or sores, and hair/skin or nail changes.  Psych       Denies mood changes, temper/anger issues, anxiety/stress, speech problems, depression, and sleep problems.  Past History:  Past Medical History: Last updated: 05/16/2009 Allergic rhinitis Asthma Diabetes mellitus, type II with neuropathy GERD HX Gastroparesis Diverticulosis Anxiety Depression Breast cancer, hx of Colonic polyps, hx -----adenomatous  2007 Hyperlipidemia COPD    - PFT's  2/07       FEV1 .79    -  PFT's 02/15/08 FEV1 .97 (45%) ratio 49, 11% better after Beta 2, DLCO 48% Multiple Pulmonary Nodules     - CT 12/24/08 assoc with R  renal mass Renal cell carcinoma  11/10 Osteoarthritis  Past Surgical History: Last updated: 04/22/2009 Cholecystectomy--1975 Tonsillectomy/A--1953denoidectomy Lumpectomy--right breast 1998 no positive nodes RT only Lung carcinoid removed (2)--1994 [facility]  Lap assisted right nephrectomy for RCC --11/10 (Dahlstedt)  Family History: Last updated: 2010/04/02 Dad died from colon and lung cancer @57  Mom died of CVA  ~62 Only child Doesn't know other family history No FH of Colon Cancer: non smoker as of 2011 per Dr. Thurston Hole chart.  Social History: Last updated:  2009-11-30 Retired--Banking Married--2 daughters, both in the area Former Smoker--quit  ~1999 Restarted with stress in 6/11 Alcohol use-no Daily Caffeine Use Illicit Drug Use - no Patient does not get regular exercise.   Risk Factors: Exercise: no (12/26/2008)  Risk Factors: Smoking Status: current (30-Nov-2009) Packs/Day: 0.5 (November 30, 2009)  Family History: Dad died from colon and lung cancer @57  Mom died of CVA  ~62 Only child Doesn't know other family history No FH of Colon Cancer: non smoker as of 2011 per Dr. Thurston Hole chart. Physical Exam General appearance: well developed, well nourished, no acute distress Eyes: conjunctivae and lids normal Ears: normal, no lesions or deformities Nasal: mucosa pink, nonedematous, no septal deviation, turbinates normal Oral/Pharynx: tongue normal, posterior pharynx without erythema or exudate Neck: neck supple,  trachea midline, no nodes Chest/Lungs: scattered rhonchi Heart: regular rate and  rhythm Extremities: normal extremities Neurological: grossly intact and non-focal MSE: oriented to time, place, and person Assessment New Problems: BRONCHITIS-ACUTE (ICD-466.0)   Plan New Medications/Changes: HYDROCOD POLST-CHLORPHEN POLST 10-8 MG/5ML LQCR (HYDROCOD POLST-CHLORPHEN POLST) 1 tsp q 12 hrs as needed cough  #3 oz x 0, 04/02/2010, J. Juline Patch MD LEVOFLOXACIN 500 MG TABS (LEVOFLOXACIN) 1 by mouth qd  #10 x 0, 04-02-2010, J. Juline Patch MD   The patient and/or caregiver has been counseled thoroughly with regard to medications prescribed including dosage, schedule, interactions, rationale for use, and possible side effects and they verbalize understanding.  Diagnoses and expected course of recovery discussed and will return if not improved as expected or if the condition worsens. Patient and/or caregiver verbalized understanding.  Prescriptions: HYDROCOD POLST-CHLORPHEN POLST 10-8 MG/5ML LQCR (HYDROCOD POLST-CHLORPHEN POLST) 1 tsp  q 12 hrs as needed cough  #3 oz x 0   Entered and Authorized by:   J. Juline Patch MD   Signed by:   Shela Commons. Juline Patch MD on 02-Apr-2010   Method used:   Print then Give to Patient   RxID:   [id number] LEVOFLOXACIN 500 MG TABS (LEVOFLOXACIN) 1 by mouth qd  #10 x 0   Entered and Authorized by:   J. Juline Patch MD   Signed by:   Shela Commons. Juline Patch MD on 2010/04/02   Method used:   Electronically to        [facility] (retail)       [address], [address]       [place], [place]  [address]       Ph: [phone]       Fax: [phone]   RxID:   [id number]   Patient Instructions: 1)  fluids, rest, use albuterol if needed. 2)  Please schedule an appointment with your primary doctor in :Dr. Edwyna Shell if worse, if not well in 10 days, and otherwise in 2-3 weeks.

## 2010-04-09 NOTE — Letter (Signed)
Summary: [facility]  [facility]   Imported By: Lester [place] 02/27/2010 09:25:33  _____________________________________________________________________  External Attachment:    Type:   Image     Comment:   External Document

## 2010-04-09 NOTE — Letter (Signed)
Summary: [facility]  [facility]   Imported By: Lester [address] 02/27/2010 09:32:20  _____________________________________________________________________  External Attachment:    Type:   Image     Comment:   External Document

## 2010-04-09 NOTE — Letter (Signed)
Summary: [facility]  [facility]   Imported By: Lester [place] 02/27/2010 09:28:59  _____________________________________________________________________  External Attachment:    Type:   Image     Comment:   External Document

## 2010-04-09 NOTE — Letter (Signed)
Summary: [facility]  [facility]   Imported By: Lester [place] 02/27/2010 09:31:23  _____________________________________________________________________  External Attachment:    Type:   Image     Comment:   External Document

## 2010-04-09 NOTE — Letter (Signed)
Summary: [facility]  [facility]   Imported By: Maryln Gottron 03/30/2010 11:24:43  _____________________________________________________________________  External Attachment:    Type:   Image     Comment:   External Document  Appended Document: [facility] renal cell carcinoma appears quiet CT in 1 year Pulm nodules followed  by Dr Edwyna Shell

## 2010-04-09 NOTE — Assessment & Plan Note (Signed)
Summary: FOLLOW UP / LFW   Vital Signs:  Patient profile:   65 year old female Weight:      141 pounds Temp:     97.9 degrees F oral Pulse rate:   72 / minute Pulse rhythm:   regular BP sitting:   120 / 68  (left arm) Cuff size:   regular  Vitals Entered By: Mervin Hack CMA Duncan Dull) (February 17, 2010 11:01 AM) CC: 3 month follow-up   History of Present Illness: Did stop smoking Still has urges to smoke but has held off  Has really required the bupropion--"I couldn't have done it without this" feels she needs to continue Husband quit also  Mood continues to be okay--she feels the med has kept her level as she has gone off the cigarettes has concerns about the cost of the lexapro though Never took celexa  Still has the same memory problems No sig worsening Has never gotten lost has not given up any tasks forgets "little things" --like where she put her rings when she took them off  Breathing is improved off cigarettes has a little cough  Allergies: 1)  ! * Penicillin 2)  Symbicort (Budesonide-Formoterol Fumarate)  Past History:  Past medical, surgical, family and social histories (including risk factors) reviewed for relevance to current acute and chronic problems.  Past Medical History: Reviewed history from 05/16/2009 and no changes required. Allergic rhinitis Asthma Diabetes mellitus, type II with neuropathy GERD HX Gastroparesis Diverticulosis Anxiety Depression Breast cancer, hx of Colonic polyps, hx -----adenomatous  2007 Hyperlipidemia COPD    - PFT's  2/07       FEV1 .79    - PFT's 02/15/08 FEV1 .97 (45%) ratio 49, 11% better after Beta 2, DLCO 48% Multiple Pulmonary Nodules     - CT 12/24/08 assoc with R  renal mass Renal cell carcinoma  11/10 Osteoarthritis  Past Surgical History: Reviewed history from 04/22/2009 and no changes required. Cholecystectomy--1975 Tonsillectomy/A--1953denoidectomy Lumpectomy--right breast 1998 no  positive nodes RT only Lung carcinoid removed (2)--1994 [facility]  Lap assisted right nephrectomy for RCC --11/10 (Dahlstedt)  Family History: Reviewed history from 12/26/2008 and no changes required. Dad died from colon and lung cancer @57  Mom died of CVA  ~62 Only child Doesn't know other family history No FH of Colon Cancer:  Social History: Reviewed history from 11/17/2009 and no changes required. Retired--Banking Married--2 daughters, both in the area Former Smoker--quit  ~1999 Restarted with stress in 6/11 Alcohol use-no Daily Caffeine Use Illicit Drug Use - no Patient does not get regular exercise.   Physical Exam  General:  alert and normal appearance.   Neck:  supple, no masses, no thyromegaly, and no cervical lymphadenopathy.   Lungs:  normal respiratory effort, no intercostal retractions, no accessory muscle use, and normal breath sounds.   Heart:  normal rate, regular rhythm, no murmur, and no gallop.   Extremities:  no edema Psych:  normally interactive, good eye contact, not anxious appearing, and not depressed appearing.     Impression & Recommendations:  Problem # 1:  CIGARETTE SMOKER (ICD-305.1) Assessment Improved has stopped needs the bupropion to remain abstinent---will continue for now indefinitiely  Problem # 2:  COPD UNSPECIFIED (ICD-496) Assessment: Improved doing better since stopping smoking  Her updated medication list for this problem includes:    Proair Hfa 108 (90 Base) Mcg/act Aers (Albuterol sulfate) .Marland Kitchen... 2 puffs up to 4 times a day as needed    Spiriva Handihaler 18 Mcg Caps (Tiotropium  bromide monohydrate) .Marland Kitchen... 1 inhalation daily  Problem # 3:  DEPRESSION (ICD-311) Assessment: Improved mood is doing well on the bupropion will try change to citalopram for cost savings  The following medications were removed from the medication list:    Lexapro 10 Mg Tabs (Escitalopram oxalate) .Marland Kitchen... 1 once daily by mouth Her updated medication list  for this problem includes:    Bupropion Hcl 150 Mg Xr12h-tab (Bupropion hcl) .Marland Kitchen... 1 tab by mouth two times a day    Citalopram Hydrobromide 20 Mg Tabs (Citalopram hydrobromide) .Marland Kitchen... 1 tab daily for depression  Complete Medication List: 1)  Bupropion Hcl 150 Mg Xr12h-tab (Bupropion hcl) .Marland Kitchen.. 1 tab by mouth two times a day 2)  Omeprazole 20 Mg Cpdr (Omeprazole) .Marland Kitchen.. 1 daily for acid reflux 3)  Proair Hfa 108 (90 Base) Mcg/act Aers (Albuterol sulfate) .... 2 puffs up to 4 times a day as needed 4)  Fexofenadine Hcl 180 Mg Tabs (Fexofenadine hcl) .... Take 1 tablet by mouth once daily 5)  Metformin Hcl 1000 Mg Tabs (Metformin hcl) .Marland Kitchen.. 1 twice a day by mouth 6)  Spiriva Handihaler 18 Mcg Caps (Tiotropium bromide monohydrate) .Marland Kitchen.. 1 inhalation daily 7)  Fluticasone Propionate 50 Mcg/act Susp (Fluticasone propionate) .... 2 sprays in each nostril daily for allergies 8)  Multivitamins Caps (Multiple vitamin) .Marland Kitchen.. 1 daily by mouth 9)  Bayer Aspirin Ec Low Dose 81 Mg Tbec (Aspirin) .Marland Kitchen.. 1 daily by mouth 10)  Glucosamine 1500 Complex Caps (Glucosamine-chondroit-vit c-mn) .Marland Kitchen.. 1 daily by mouth 11)  Fish Oil 1000 Mg Caps (Omega-3 fatty acids) .... Once daily 12)  Vitamin D 1000 Unit Tabs (Cholecalciferol) .Marland Kitchen.. 1 tab daily 13)  Freestyle Test Strp (Glucose blood) .... Check sugar daily  icd-9 code 250.00 14)  Citalopram Hydrobromide 20 Mg Tabs (Citalopram hydrobromide) .Marland Kitchen.. 1 tab daily for depression 15)  Gabapentin 300 Mg Caps (Gabapentin) .... 2 capsules by mouth four times daily  Patient Instructions: 1)  Please schedule a follow-up appointment in 6 months for Medicare wellness visit Prescriptions: METFORMIN HCL 1000 MG TABS (METFORMIN HCL) 1 twice a day by mouth  #180 x 3   Entered and Authorized by:   Cindee Salt MD   Signed by:   Cindee Salt MD on 02/17/2010   Method used:   Print then Give to Patient   RxID:   [id number] OMEPRAZOLE 20 MG CPDR (OMEPRAZOLE) 1 daily for acid  reflux  #90 x 3   Entered and Authorized by:   Cindee Salt MD   Signed by:   Cindee Salt MD on 02/17/2010   Method used:   Print then Give to Patient   RxID:   [id number] GABAPENTIN 300 MG CAPS (GABAPENTIN) 2 capsules by mouth four times daily  #720 x 3   Entered and Authorized by:   Cindee Salt MD   Signed by:   Cindee Salt MD on 02/17/2010   Method used:   Print then Give to Patient   RxID:   [id number] SPIRIVA HANDIHALER 18 MCG CAPS (TIOTROPIUM BROMIDE MONOHYDRATE) 1 inhalation daily  #3 x 3   Entered and Authorized by:   Cindee Salt MD   Signed by:   Cindee Salt MD on 02/17/2010   Method used:   Print then Give to Patient   RxID:   [id number] CITALOPRAM HYDROBROMIDE 20 MG TABS (CITALOPRAM HYDROBROMIDE) 1 tab daily for depression  #90 x 3   Entered and Authorized by:  Cindee Salt MD   Signed by:   Cindee Salt MD on 02/17/2010   Method used:   Print then Give to Patient   RxID:   [id number]    Orders Added: 1)  Est. Patient Level IV [56213]    Current Allergies (reviewed today): ! * PENICILLIN SYMBICORT (BUDESONIDE-FORMOTEROL FUMARATE)

## 2010-04-29 ENCOUNTER — Other Ambulatory Visit: Payer: Self-pay | Admitting: Thoracic Surgery

## 2010-04-29 DIAGNOSIS — R911 Solitary pulmonary nodule: Secondary | ICD-10-CM

## 2010-05-11 ENCOUNTER — Telehealth: Payer: Self-pay | Admitting: Family Medicine

## 2010-05-19 NOTE — Progress Notes (Signed)
Summary: flonase   Phone Note Refill Request Call back at Home Phone [phone] Message from:  Patient on May 11, 2010 11:03 AM  Refills Requested: Medication #1:  FLUTICASONE PROPIONATE 50 MCG/ACT SUSP 2 sprays in each nostril daily for allergies Patient is asking for written script for mail order. Pt wants generic.  Initial call taken by: Melody Comas,  May 11, 2010 11:04 AM  Follow-up for Phone Call        Spoke with patient and advised rx ready for pick-up  Follow-up by: Mervin Hack CMA Duncan Dull),  May 11, 2010 1:57 PM    Prescriptions: FLUTICASONE PROPIONATE 50 MCG/ACT SUSP (FLUTICASONE PROPIONATE) 2 sprays in each nostril daily for allergies  #3 x 3   Entered and Authorized by:   Ruthe Mannan MD   Signed by:   Ruthe Mannan MD on 05/11/2010   Method used:   Print then Give to Patient   RxID:   [id number]

## 2010-05-25 LAB — GLUCOSE, CAPILLARY: Glucose-Capillary: 125 mg/dL — ABNORMAL HIGH (ref 70–99)

## 2010-06-01 ENCOUNTER — Other Ambulatory Visit: Payer: Self-pay | Admitting: Internal Medicine

## 2010-06-01 ENCOUNTER — Telehealth: Payer: Self-pay | Admitting: Internal Medicine

## 2010-06-01 NOTE — Telephone Encounter (Signed)
Reviewed and agree DB 

## 2010-06-01 NOTE — Telephone Encounter (Signed)
Patient states she is bloated and has not had a bowel movement in 3-4 days. She has just gotten a bottle of Magnesium Citrate. Patient instructed to try 1/2 bottle this AM and if no results by PM drink the other 1/2. Suggested she start Miralax 17 Grams daily after this to maintain regular bowel movements. Patient understands to call back if this does not help.

## 2010-06-02 ENCOUNTER — Other Ambulatory Visit: Payer: Self-pay | Admitting: Internal Medicine

## 2010-06-03 ENCOUNTER — Ambulatory Visit (INDEPENDENT_AMBULATORY_CARE_PROVIDER_SITE_OTHER): Payer: Medicare Other | Admitting: Thoracic Surgery

## 2010-06-03 ENCOUNTER — Ambulatory Visit
Admission: RE | Admit: 2010-06-03 | Discharge: 2010-06-03 | Disposition: A | Discharge status: Home / Self Care | Payer: Medicare Other | Source: Ambulatory Visit | Attending: Thoracic Surgery | Admitting: Thoracic Surgery

## 2010-06-03 DIAGNOSIS — D381 Neoplasm of uncertain behavior of trachea, bronchus and lung: Secondary | ICD-10-CM

## 2010-06-03 DIAGNOSIS — R911 Solitary pulmonary nodule: Secondary | ICD-10-CM

## 2010-06-03 NOTE — Letter (Signed)
June 03, 2010  Bertram Millard. Dahlstedt, MD [address] [place], [place]  [address]  Re:  [name], [name]               DOB:  Jun 09, 1945  Dear Jeannett Senior,  I saw the patient back today and repeated her CT scans and there is no real change in her multiple pulmonary nodules and the nodule in the left lower lobe most question will be slightly enlarged, but they could not tell for sure.  Given the low uptake on PET scans, I think we will continue to follow her and see her back again in 6 months with another CT scan.  I will get the next CT scan with superDimension protocol because if these are still there, I may try to consider biopsy in these with electromagnetic navigation bronchoscopy as they are probably large enough to possibly get a diagnosis with that technique.  I appreciate the opportunity of seeing the patient.  Sincerely,  Ines Bloomer, M.D. Electronically Signed  DPB/MEDQ  D:  06/03/2010  T:  06/03/2010  Job:  [id number]  cc:   Karie Schwalbe, MD

## 2010-06-10 LAB — HEMOGLOBIN AND HEMATOCRIT, BLOOD
HCT: 27.3 % — ABNORMAL LOW (ref 36.0–46.0)
HCT: 28 % — ABNORMAL LOW (ref 36.0–46.0)
Hemoglobin: 8.2 g/dL — ABNORMAL LOW (ref 12.0–15.0)
Hemoglobin: 9.3 g/dL — ABNORMAL LOW (ref 12.0–15.0)

## 2010-06-10 LAB — PROTIME-INR: Prothrombin Time: 12.5 seconds (ref 11.6–15.2)

## 2010-06-10 LAB — CBC
HCT: 27.3 % — ABNORMAL LOW (ref 36.0–46.0)
HCT: 28.8 % — ABNORMAL LOW (ref 36.0–46.0)
HCT: 39.1 % (ref 36.0–46.0)
Hemoglobin: 9.1 g/dL — ABNORMAL LOW (ref 12.0–15.0)
Hemoglobin: 9.5 g/dL — ABNORMAL LOW (ref 12.0–15.0)
MCHC: 32.8 g/dL (ref 30.0–36.0)
MCHC: 33.3 g/dL (ref 30.0–36.0)
MCV: 89.9 fL (ref 78.0–100.0)
MCV: 89.9 fL (ref 78.0–100.0)
Platelets: 151 10*3/uL (ref 150–400)
Platelets: 213 10*3/uL (ref 150–400)
Platelets: 323 10*3/uL (ref 150–400)
RBC: 3.04 MIL/uL — ABNORMAL LOW (ref 3.87–5.11)
RBC: 3.17 MIL/uL — ABNORMAL LOW (ref 3.87–5.11)
RDW: 13.9 % (ref 11.5–15.5)
RDW: 14.6 % (ref 11.5–15.5)
RDW: 13.7 % (ref 11.5–15.5)
WBC: 6.2 10*3/uL (ref 4.0–10.5)
WBC: 6.2 10*3/uL (ref 4.0–10.5)

## 2010-06-10 LAB — BASIC METABOLIC PANEL
BUN: 9 mg/dL (ref 6–23)
CO2: 24 mEq/L (ref 19–32)
CO2: 27 mEq/L (ref 19–32)
CO2: 28 mEq/L (ref 19–32)
Calcium: 7.9 mg/dL — ABNORMAL LOW (ref 8.4–10.5)
Calcium: 9.4 mg/dL (ref 8.4–10.5)
Chloride: 107 mEq/L (ref 96–112)
Chloride: 112 mEq/L (ref 96–112)
Creatinine, Ser: 0.74 mg/dL (ref 0.4–1.2)
GFR calc Af Amer: >60 mL/min (ref >60)
GFR calc non Af Amer: 38 mL/min — ABNORMAL LOW (ref >60)
Glucose, Bld: 105 mg/dL — ABNORMAL HIGH (ref 70–99)
Glucose, Bld: 132 mg/dL — ABNORMAL HIGH (ref 70–99)
Glucose, Bld: 144 mg/dL — ABNORMAL HIGH (ref 70–99)
Potassium: 3.8 mEq/L (ref 3.5–5.1)
Potassium: 4.1 mEq/L (ref 3.5–5.1)
Sodium: 139 mEq/L (ref 135–145)
Sodium: 140 mEq/L (ref 135–145)
Sodium: 141 mEq/L (ref 135–145)

## 2010-06-10 LAB — TYPE AND SCREEN
ABO/RH(D): O POS
Antibody Screen: NEGATIVE

## 2010-06-10 LAB — GLUCOSE, CAPILLARY
Glucose-Capillary: 104 mg/dL — ABNORMAL HIGH (ref 70–99)
Glucose-Capillary: 105 mg/dL — ABNORMAL HIGH (ref 70–99)
Glucose-Capillary: 115 mg/dL — ABNORMAL HIGH (ref 70–99)
Glucose-Capillary: 120 mg/dL — ABNORMAL HIGH (ref 70–99)
Glucose-Capillary: 120 mg/dL — ABNORMAL HIGH (ref 70–99)
Glucose-Capillary: 131 mg/dL — ABNORMAL HIGH (ref 70–99)
Glucose-Capillary: 135 mg/dL — ABNORMAL HIGH (ref 70–99)
Glucose-Capillary: 140 mg/dL — ABNORMAL HIGH (ref 70–99)
Glucose-Capillary: 143 mg/dL — ABNORMAL HIGH (ref 70–99)
Glucose-Capillary: 151 mg/dL — ABNORMAL HIGH (ref 70–99)
Glucose-Capillary: 155 mg/dL — ABNORMAL HIGH (ref 70–99)
Glucose-Capillary: 171 mg/dL — ABNORMAL HIGH (ref 70–99)
Glucose-Capillary: 181 mg/dL — ABNORMAL HIGH (ref 70–99)
Glucose-Capillary: 183 mg/dL — ABNORMAL HIGH (ref 70–99)
Glucose-Capillary: 191 mg/dL — ABNORMAL HIGH (ref 70–99)

## 2010-06-10 LAB — PREPARE RBC (CROSSMATCH)

## 2010-06-10 LAB — APTT: aPTT: 26 seconds (ref 24–37)

## 2010-06-10 LAB — SODIUM, URINE, RANDOM: Sodium, Ur: 58 mEq/L

## 2010-06-23 ENCOUNTER — Telehealth: Payer: Self-pay | Admitting: Internal Medicine

## 2010-06-23 NOTE — Telephone Encounter (Signed)
Patient calling to report she is still having problems with constipation and bloating. States she got cleaned out good with the magnesium citrate but is having problems again. States her stomach gets bigger during the day and she belches a lot. She is using Miralax daily and she had a small bowel movement yesterday. Suggested she increase the Miralax to BID. She will try this and will call me back later in the week with an update on how this is working.

## 2010-06-23 NOTE — Telephone Encounter (Signed)
Reviewed and agree DB 

## 2010-06-26 ENCOUNTER — Telehealth: Payer: Self-pay | Admitting: Internal Medicine

## 2010-06-26 MED ORDER — METRONIDAZOLE 250 MG PO TABS: 250.0000 mg | ORAL_TABLET | Freq: Three times a day (TID) | ORAL | Status: AC

## 2010-06-26 NOTE — Telephone Encounter (Signed)
Patient given Dr. Regino Schultze recommendations. Scheduled patient on 07/15/10 at 2:00 PM with Dr. [name]. Rx sent to patient's pharmacy. She will call for further problems.

## 2010-06-26 NOTE — Telephone Encounter (Signed)
Patient calling with update. States she is still bloated. She has been using Miralax BID and today her stools are liquid. Patient instructed to hold on Miralax for now. States she has a throbbing pain that comes and goes under her breast bone, her stomach growls a lot and she is burping more. States she is not passing much gas. Last colon- 12/3/7- polyps- tubular adenoma, hyperplastic

## 2010-06-26 NOTE — Telephone Encounter (Signed)
She needs to be seen in the office. Please put her empirically on Flagyl 250 mg po tid x 7 days, full liquid diet x 48 hrs..Please add her on to my schedule at some point.

## 2010-07-13 ENCOUNTER — Encounter: Payer: Self-pay | Admitting: Internal Medicine

## 2010-07-15 ENCOUNTER — Encounter: Payer: Self-pay | Admitting: Internal Medicine

## 2010-07-15 ENCOUNTER — Ambulatory Visit (INDEPENDENT_AMBULATORY_CARE_PROVIDER_SITE_OTHER): Payer: Medicare Other | Admitting: Internal Medicine

## 2010-07-15 VITALS — BP 128/64 | HR 68 | Ht 63.0 in | Wt 142.0 lb

## 2010-07-15 DIAGNOSIS — K3184 Gastroparesis: Secondary | ICD-10-CM

## 2010-07-15 DIAGNOSIS — K5901 Slow transit constipation: Secondary | ICD-10-CM

## 2010-07-15 NOTE — Progress Notes (Signed)
[name] Mar 01, 1946 MRN [id number]    History of Present Illness:  This is a 65 year old white female diabetic with chronic bloating and constipation. She has been much improved after a course of laxatives, probiotics and Flagyl 250 mg 3 times a day. She has documented gastroparesis on a gastric emptying scan in October 2010 and has a history of an adenomatous polyp of the colon in December 2007. She is due for a repeat colonoscopy in December 2012. There is a history of breast cancer in 1998 and a carcinoid tumor of the lung in 1994. She had a right renal mass removed in October 2010 diagnosed on her nephrectomy as renal cell carcinoma. She is much improved today and is having bowel movements daily. She denies rectal bleeding.   Past Medical History  Diagnosis Date  . Renal cell carcinoma   . Asthma   . Diabetes mellitus type II   . GERD (gastroesophageal reflux disease)   . Gastroparesis   . Diverticulosis   . Anxiety   . Depression   . History of breast cancer   . History of adenomatous polyp of colon   . Hyperlipidemia   . COPD (chronic obstructive pulmonary disease)   . Pulmonary nodule     multiple  . Osteoarthritis   . Carcinoid tumor of lung 1991   Past Surgical History  Procedure Date  . Cholecystectomy 1975  . Tonsillectomy and adenoidectomy 1953  . Breast lumpectomy 1998    right breast  . Lung surgery 1994    carcinoid removal  . Laparoscopic nephrectomy 01/2009    right    reports that she has quit smoking. She does not have any smokeless tobacco history on file. She reports that she does not drink alcohol or use illicit drugs. family history includes Colon cancer in her father; Lung cancer in her father; and Stroke in her mother. Allergies  Allergen Reactions  . Penicillins     REACTION: rash  . Symbicort     REACTION: coughed worse        Review of Systems: Denies heartburn, dysphagia, abdominal pain positive for bloating.  The remainder of  the 10  point ROS is negative except as outlined in H&P   Physical Exam: General appearance  Well developed, in no distress. Eyes- non icteric. HEENT nontraumatic, normocephalic. Mouth no lesions, tongue papillated, no cheilosis. Neck supple without adenopathy, thyroid not enlarged, no carotid bruits, no JVD. Lungs Clear to auscultation bilaterally. Cor normal S1 normal S2, regular rhythm , no murmur,  quiet precordium. Abdomen in standing position, abdomen appears protuberant and there is a diastasis recti. The abdomen is flat and scaphoid with decreased muscle tone. There some stretch marks. Minimal tenderness in right costal margin. Post cholecystectomy scar. Rectal: Soft Hemoccult negative stool. Extremities no pedal edema. Skin no lesions. Neurological alert and oriented x 3. Psychological normal mood and affect.  Assessment and Plan:   Problem #1 constipation, much relieved after a course of MiraLax and oral laxatives. She is currently maintained on probiotics, high fiber diet and apples.  Problem #2 adenomatous polyps of the colon. Patient is due for a colonoscopy in December 2012. She will receive a recall letter.  Number #3 gastroparesis. 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 She should be on a gastroparesis diet.   07/15/2010 Lina Sar

## 2010-07-21 NOTE — Letter (Signed)
November 18, 2009   Karie Schwalbe, MD  [address] [place], [place] [address]   Re:  [name], [name]               DOB:  [birth year]   Dear Dr. Alphonsus Sias:   The patient came today for follow up for her CT scan for multiple  pulmonary nodules.  Her blood pressure was 111/65, pulse 86,  respirations 16, sats were 92%.  She has had no medical problems since  we saw her 3 month ago, and the CT scan today shows no change in her  multiple nodules.  We will follow her again in about 6 months with  another CT scan.   Ines Bloomer, M.D.  Electronically Signed   DPB/MEDQ  D:  11/18/2009  T:  11/19/2009  Job:  [id number]   cc:   Bertram Millard. Dahlstedt, M.D.

## 2010-07-21 NOTE — Letter (Signed)
August 20, 2009   Bertram Millard. Dahlstedt, MD  [address]  [place], [place] [address]   Re:  KSENIA, KUNZ               DOB:  10/30/1945   Dear Brett Canales,   I saw the patient back today, and her PET scan shows no uptake in the  multiple small nodules, and the left lingular nodule just had an uptake  of 1.2.  Given her multiple nodules and her previous history of  carcinoid syndrome, I would rely on the PET scan as far as deciding  whether we need to proceed with any surgery or not, and given the low  uptake I think we can safely follow her.  Worse case scenario, if this  is a low-grade cancer it will enlarge slowly over the next several  months.  Her blood pressure was 112/70, pulse 100, respirations 18, sats  were 97%.  Her CT scan does show a lot of inflammatory diseases in her  lung which has somewhat progressed since her previous CT scan over a  year ago.  I will plan to see her back again in 3 months with another CT  scan for continued observation.   Ines Bloomer, M.D.  Electronically Signed   DPB/MEDQ  D:  08/20/2009  T:  08/21/2009  Job:  [id number]   cc:   Karie Schwalbe, MD

## 2010-07-21 NOTE — Letter (Signed)
Aug 05, 2009   Bertram Millard. Dahlstedt, MD  [address]  [place], [place] [address]   Re:  [name], [name]               DOB:  December 14, 1945   Dear Dr. Retta Diones:   I appreciate the opportunity of seeing the patient.  This 65 year old  Caucasian female has had a long complicated history.  In 2010, she had a  right nephrectomy for a papillary type renal cell cancer.  At that time,  she was seen by Dr. Sherene Sires and apparently had multiple pulmonary nodules.  She also has history of having 2 carcinoids removed at [facility] with a right  thoracotomy in 1995.  She has also quit smoking in that year.  Her  recent CT scan showed enlargement of her right upper lobe nodule and her  left lingular nodule and she is referred here for evaluation.   PAST MEDICAL HISTORY:  Past medical histories consist of allergic  rhinitis, anxiety, and asthma.  She has claustrophobia for certain scans  including PET scan.  She has a history of chronic obstructive pulmonary  disease, depression, diverticulosis, esophageal varicose, diabetes  mellitus type 2, and hyperlipidemia.  She also had breast cancer with a  surgical lumpectomy.  She had a cholecystectomy and tonsillectomy.   MEDICATIONS:  1. Alprazolam 0.5.  2. Aspirin.  3. Fexofenadine hydrochloride 180 mg daily.  4. Iron.  5. Lexapro 10 mg a day.  6. Metformin 1000 mg.  7. Neurontin 600 mg.  8. Vitamins.  9. ProAir.  10.Simvastatin 40 mg daily.  11.Prilosec 20 mg daily.   ALLERGIES:  She is allergic to penicillin, which causes rash.   FAMILY HISTORY:  Positive for cancer and vascular disease and coronary  artery disease.  She also has diabetes mellitus type 2.   SOCIAL HISTORY:  She is married, has 2 children.  She is retired and  quit smoking in 1995.  Does not drink alcohol on a regular basis.   REVIEW OF SYSTEMS:  VITAL SIGNS:  She is 145 pounds.  She is 5 feet 4  inches.  GENERAL:  Her weight has been stable.  CARDIAC:  No angina or atrial  fibrillation.  PULMONARY:  She got asthma.  GI:  She got reflux.  GU:  No kidney disease, dysuria, or frequent urination.  VASCULAR:  No claudication, DVT, or TIAs.  NEUROLOGICAL:  No dizziness, headaches, blackouts, or seizures.  MUSCULOSKELETAL:  Arthritis.  PSYCHIATRIC:  See past medical history.  EYE/ENT:  No change in her eyesight or hearing.  HEMATOLOGICAL:  No problems with bleeding, clotting disorders, or  anemia.   PHYSICAL EXAMINATION:  Vital Signs:  Her blood pressure was 111/70,  pulse 75, respirations 18, and sats were 92%.  Head, Eyes, Ears, Nose,  and Throat:  Unremarkable.  Neck:  Supple without thyromegaly.  Chest:  Clear to auscultation and percussion.  Heart:  Regular sinus rhythm.  No  murmurs.  Abdomen:  Soft.  There is no hepatosplenomegaly.  Extremities:  Pulses are 2+.  There is no clubbing or edema.  Neurological:  She is  oriented x3.  Sensory and motor intact.  There are surgical scars in the  right chest and in her abdomen.   I think the nodules have definitely enlarged, but she has a history of  having nodules.  This could be multiple carcinoid syndrome and we have  seen this several times.  I think the first thing to  do is get a PET  scan to see what the metabolic activity of all of these nodules are.  If  one has a high metabolic activity or if continues to increase in size,  then VATS resection of this may be indicated.  I appreciate the  opportunity of seeing the patient.  I will let you if she gets her PET  scan.  We may have to give her some medication because of her  claustrophobia.  I will see her back after her PET scan.   Ines Bloomer, M.D.  Electronically Signed   DPB/MEDQ  D:  06-Apr-202011  T:  08/06/2009  Job:  [id number]   cc:   Evangeline Dakin, M.D.

## 2010-07-24 NOTE — Assessment & Plan Note (Signed)
[facility]                           GASTROENTEROLOGY OFFICE NOTE   NAME:BELCHERFahima, [name]                      MRN:          [id number]  DATE:01/04/2006                            DOB:          June 26, 1945    PROGRESS NOTE:  [name] is a very nice 65 year old patient of Dr.  Dareen Piano who has gastroparesis documented on a gastric emptying scan. She is  a diabetic. She has been on Relgan 10 mg up to 4 a day as well as low-  residue meals at frequent intervals. She is here today for colorectal  screening. She has a history of colon polyps removed by Dr. Mechele Collin on at  least 3 previous colonoscopies, last one about 4 years ago. We unfortunately  do not have that documentation, but she was told to have a repeat  colonoscopy in 3 years. She also has been found to be mildly anemic with low  iron saturation of 6%.   MEDICATIONS:  Aspirin 81 mg a day. She denies taking any NSAIDs and she  denies any visible blood per rectum. She also takes Advil on p.r.n. basis.  She has never taken iron supplement. She has been postmenopausal for many  years.   PHYSICAL EXAMINATION:  VITAL SIGNS:  Blood pressure 116/64, pulse 80 and  weight 167 pounds which is 12 pounds weight loss since the last time we saw  her.  GENERAL:  She was alert, oriented and in no distress.  LUNGS:  Clear to auscultation.  COR:  Normal S1, normal S2.  ABDOMEN:  Soft and nontender with normal active bowel sounds. Liver edge at  costal margin. Low abdomen was normal.  RECTAL:  Showed soft Hemoccult negative stool.  EXTREMITIES:  No edema.   IMPRESSION:  23. A 65 year old white female with gastroparesis and iron deficiency      anemia. No obvious evidence of GI blood loss on today's exam, but one      wonders if there is intermittent low grade gastrointestinal blood loss      related to aspirin or Advil. She is postmenopausal therefore her iron      deficiency anemia ought to be further  evaluated.  2. History of multiple colon polyps status post previous colonoscopy.      Patient due for repeat colonoscopy.  3. Gastroparesis.  4. Constipation.   PLAN:  1. Samples of iron,Tandem plus given to the patient to take one a day. She      may have to take a laxative with this.  2. Colonoscopy scheduled using routine colonoscopy prep. She will      discontinue her aspirin 1 week prior to the colonoscopy.     [name]. Tracy Chance, MD  Electronically Signed    DMB/MedQ  DD: 01/04/2006  DT: 01/05/2006  Job #: [id number]   cc:   Einar Crow, MD

## 2010-08-07 ENCOUNTER — Encounter: Payer: Self-pay | Admitting: Internal Medicine

## 2010-08-10 ENCOUNTER — Encounter: Payer: Self-pay | Admitting: Internal Medicine

## 2010-08-10 ENCOUNTER — Ambulatory Visit (INDEPENDENT_AMBULATORY_CARE_PROVIDER_SITE_OTHER): Payer: Medicare Other | Admitting: Internal Medicine

## 2010-08-10 VITALS — BP 120/60 | HR 72 | Temp 98.8°F | Ht 62.5 in | Wt 142.0 lb

## 2010-08-10 DIAGNOSIS — J449 Chronic obstructive pulmonary disease, unspecified: Secondary | ICD-10-CM

## 2010-08-10 DIAGNOSIS — F411 Generalized anxiety disorder: Secondary | ICD-10-CM

## 2010-08-10 DIAGNOSIS — F329 Major depressive disorder, single episode, unspecified: Secondary | ICD-10-CM

## 2010-08-10 DIAGNOSIS — Z0000 Encounter for general adult medical examination without abnormal findings: Secondary | ICD-10-CM

## 2010-08-10 DIAGNOSIS — R413 Other amnesia: Secondary | ICD-10-CM

## 2010-08-10 DIAGNOSIS — E1149 Type 2 diabetes mellitus with other diabetic neurological complication: Secondary | ICD-10-CM

## 2010-08-10 LAB — BASIC METABOLIC PANEL
GFR: 45.86 mL/min — ABNORMAL LOW (ref >60.00)
Glucose, Bld: 102 mg/dL — ABNORMAL HIGH (ref 70–99)
Potassium: 4.7 mEq/L (ref 3.5–5.1)
Sodium: 139 mEq/L (ref 135–145)

## 2010-08-10 LAB — CBC WITH DIFFERENTIAL/PLATELET
Basophils Absolute: 0 10*3/uL (ref 0.0–0.1)
Eosinophils Absolute: 0.2 10*3/uL (ref 0.0–0.7)
HCT: 38.6 % (ref 36.0–46.0)
Hemoglobin: 13 g/dL (ref 12.0–15.0)
Lymphs Abs: 3.1 10*3/uL (ref 0.7–4.0)
MCHC: 33.6 g/dL (ref 30.0–36.0)
Monocytes Absolute: 0.5 10*3/uL (ref 0.1–1.0)
Neutro Abs: 3.8 10*3/uL (ref 1.4–7.7)
RDW: 14.2 % (ref 11.5–14.6)

## 2010-08-10 LAB — HEPATIC FUNCTION PANEL
Alkaline Phosphatase: 44 U/L (ref 39–117)
Bilirubin, Direct: 0.1 mg/dL (ref 0.0–0.3)
Total Protein: 6.7 g/dL (ref 6.0–8.3)

## 2010-08-10 LAB — TSH: TSH: 2.11 u[IU]/mL (ref 0.35–5.50)

## 2010-08-10 NOTE — Assessment & Plan Note (Signed)
UTD on imms and cancer screening Mild memory issues which are stable  I have personally reviewed the Medicare Annual Wellness questionnaire and have noted 1. The patient's medical and social history 2. Their use of alcohol, tobacco or illicit drugs 3. Their current medications and supplements 4. The patient's functional ability including ADL's, fall risks, home safety risks and hearing or visual             impairment. 5. Diet and physical activities 6. Evidence for depression or mood disorders  The patients weight, height, BMI and visual acuity have been recorded in the chart I have made referrals, counseling and provided education to the patient based review of the above and I have provided the pt with a written personalized care plan for preventive services.  I have provided you with a copy of your personalized plan for preventive services. Please take the time to review along with your updated medication list.

## 2010-08-10 NOTE — Assessment & Plan Note (Signed)
Controlled with dual therapy Still not smoking

## 2010-08-10 NOTE — Assessment & Plan Note (Signed)
Mostly recall Trouble with numbers but spells backward fairly quickly--doubt sig issues with executive function Will check labs

## 2010-08-10 NOTE — Assessment & Plan Note (Signed)
Not active  Needs to continue meds

## 2010-08-10 NOTE — Progress Notes (Signed)
Subjective:    Patient ID: [name], female    DOB: August 22, 1945, 65 y.o.   MRN: [id number]  HPI Here for wellness visit Quite some trouble--her written list is not correct Is only on spiriva for lungs Does take the bupropion still and citalopram She will review at home with her actual meds  Imms and preventative services are up to date  Does have some concerns about memory Still handles her bills, etc.  No trouble with executive function  Feels her anxiety is controlled Not really depressed  Checks sugars occ Generally under 120 No hypoglycemic reactions  Breathing fine on just spiriva  Current outpatient prescriptions:albuterol (PROAIR HFA) 108 (90 BASE) MCG/ACT inhaler, Take 2 puffs up to four times daily as needed , Disp: , Rfl: ;  aspirin 81 MG tablet, Take 81 mg by mouth daily.  , Disp: , Rfl: ;  buPROPion (WELLBUTRIN SR) 150 MG 12 hr tablet, TAKE 1 TABLET BY MOUTH TWICE DAILY, Disp: 60 tablet, Rfl: 2;  Cholecalciferol (VITAMIN D) 1000 UNITS capsule, Take 1,000 Units by mouth daily.  , Disp: , Rfl:  citalopram (CELEXA) 20 MG tablet, Take 20 mg by mouth daily.  , Disp: , Rfl: ;  Docusate Calcium (STOOL SOFTENER PO), Take by mouth. As needed , Disp: , Rfl: ;  fexofenadine (ALLEGRA) 180 MG tablet, Take 180 mg by mouth daily.  , Disp: , Rfl: ;  fluticasone (FLONASE) 50 MCG/ACT nasal spray, 2 sprays by Nasal route daily.  , Disp: , Rfl: ;  gabapentin (NEURONTIN) 300 MG capsule, Take 2 capsules by mouth four times per day. , Disp: , Rfl:  Glucosamine-Chondroit-Vit C-Mn (GLUCOSAMINE 1500 COMPLEX PO), Take 1 tablet by mouth daily.  , Disp: , Rfl: ;  metformin (FORTAMET) 1000 MG (OSM) 24 hr tablet, Take 1,000 mg by mouth 2 (two) times daily.  , Disp: , Rfl: ;  Multiple Vitamin (MULTIVITAMIN) tablet, Take 1 tablet by mouth daily.  , Disp: , Rfl: ;  Omega-3 Fatty Acids (FISH OIL) 1000 MG CAPS, Take 1 capsule by mouth daily.  , Disp: , Rfl:  omeprazole (PRILOSEC) 20 MG capsule, Take 20 mg  by mouth daily.  , Disp: , Rfl: ;  tiotropium (SPIRIVA) 18 MCG inhalation capsule, Place 18 mcg into inhaler and inhale daily.  , Disp: , Rfl:   Past Medical History  Diagnosis Date  . Renal cell carcinoma   . Asthma   . Diabetes mellitus type II   . GERD (gastroesophageal reflux disease)   . Gastroparesis   . Diverticulosis   . Anxiety   . Depression   . History of breast cancer   . History of adenomatous polyp of colon   . Hyperlipidemia   . COPD (chronic obstructive pulmonary disease)   . Pulmonary nodule     multiple  . Osteoarthritis   . Carcinoid tumor of lung 1991    Past Surgical History  Procedure Date  . Cholecystectomy 1975  . Tonsillectomy and adenoidectomy 1953  . Breast lumpectomy 1998    right breast  . Lung surgery 1994    carcinoid removal  . Laparoscopic nephrectomy 01/2009    right    Family History  Problem Relation Age of Onset  . Colon cancer Father   . Lung cancer Father   . Stroke Mother     History   Social History  . Marital Status: Married    Spouse Name: N/A    Number of Children: 2  . Years  of Education: N/A   Occupational History  . retired Photographer    Social History Main Topics  . Smoking status: Former Games developer  . Smokeless tobacco: Not on file  . Alcohol Use: No  . Drug Use: No  . Sexually Active: Not on file   Other Topics Concern  . Not on file   Social History Narrative   Restarted with stress in 6/11Daily Caffeine UsePatient does not get regular exercise.        Review of Systems No regular exercise Sleeps fine Weight is stable Bowels are problematic----better now with Dr Regino Schultze intervention    Objective:   Physical Exam  Constitutional: She appears well-developed and well-nourished. No distress.  Neck: Normal range of motion. Neck supple. No thyromegaly present.  Cardiovascular: Normal rate, regular rhythm, normal heart sounds and intact distal pulses.  Exam reveals no gallop.   No murmur  heard. Pulmonary/Chest: Effort normal and breath sounds normal. No respiratory distress. She has no wheezes. She has no rales.  Abdominal: Soft. She exhibits no mass. There is no tenderness.  Musculoskeletal: Normal range of motion. She exhibits no edema and no tenderness.  Lymphadenopathy:    She has no cervical adenopathy.  Neurological:       Notes short term memory loss 100-93(with difficulty)- [id number] [name] ([place] with prompting) 0/3 recall after 3 minutes  Psychiatric: She has a normal mood and affect. Her behavior is normal. Judgment and thought content normal.          Assessment & Plan:

## 2010-08-10 NOTE — Assessment & Plan Note (Signed)
Doing okay on just spiriva

## 2010-08-10 NOTE — Assessment & Plan Note (Signed)
Lab Results  Component Value Date   HGBA1C 6.5 11/17/2009   Still seems to have good control Will check labs Lab Results  Component Value Date   LDLCALC 70 05/16/2009   No need to recheck cholesterol

## 2010-08-25 ENCOUNTER — Other Ambulatory Visit: Payer: Self-pay | Admitting: Internal Medicine

## 2010-08-25 DIAGNOSIS — Z1231 Encounter for screening mammogram for malignant neoplasm of breast: Secondary | ICD-10-CM

## 2010-09-08 ENCOUNTER — Other Ambulatory Visit: Payer: Self-pay | Admitting: Internal Medicine

## 2010-10-12 ENCOUNTER — Other Ambulatory Visit: Payer: Self-pay | Admitting: Internal Medicine

## 2010-10-12 NOTE — Telephone Encounter (Signed)
Okay to refill for 1 year. 

## 2010-10-13 NOTE — Telephone Encounter (Signed)
rx sent to pharmacy by e-script  

## 2010-10-27 ENCOUNTER — Other Ambulatory Visit: Payer: Self-pay | Admitting: Thoracic Surgery

## 2010-10-27 DIAGNOSIS — R911 Solitary pulmonary nodule: Secondary | ICD-10-CM

## 2010-11-19 ENCOUNTER — Telehealth: Payer: Self-pay | Admitting: *Deleted

## 2010-11-19 NOTE — Telephone Encounter (Signed)
Patient came in the office and dropped off a form to have Dr.Letvak sign for a permanent disability parking placard.  Per Dr.Letvak he wanted to find out why she needs this, she told him her breathing was ok and no major documented ortho issues.  Marland Kitchenleft message to have patient return my call.

## 2010-11-24 NOTE — Telephone Encounter (Signed)
Pt states she needs this for the neuropathy in her feet and her breathing problems.  This is for a renewal, she has a placard that is expiring.

## 2010-11-25 NOTE — Telephone Encounter (Signed)
Handicapped form done.

## 2010-11-25 NOTE — Telephone Encounter (Signed)
Left message that form is done and will be at the front desk.

## 2010-11-27 ENCOUNTER — Encounter: Payer: Self-pay | Admitting: Thoracic Surgery

## 2010-12-02 ENCOUNTER — Ambulatory Visit (INDEPENDENT_AMBULATORY_CARE_PROVIDER_SITE_OTHER): Payer: Medicare Other | Admitting: Thoracic Surgery

## 2010-12-02 ENCOUNTER — Encounter: Payer: Self-pay | Admitting: Thoracic Surgery

## 2010-12-02 ENCOUNTER — Ambulatory Visit
Admission: RE | Admit: 2010-12-02 | Discharge: 2010-12-02 | Disposition: A | Discharge status: Home / Self Care | Payer: Medicare Other | Source: Ambulatory Visit | Attending: Thoracic Surgery | Admitting: Thoracic Surgery

## 2010-12-02 VITALS — BP 132/81 | HR 74 | Resp 20 | Ht 64.0 in | Wt 145.0 lb

## 2010-12-02 DIAGNOSIS — J984 Other disorders of lung: Secondary | ICD-10-CM

## 2010-12-02 DIAGNOSIS — R911 Solitary pulmonary nodule: Secondary | ICD-10-CM

## 2010-12-02 NOTE — Progress Notes (Signed)
HPI patient returns for followup. CT scan shows no change in his right lower lobe nodule. It is 10 x 13 mm in size. There is evidence of possible Mycobacterium avium intracellulare in a lung period. I will see her again in 6 months with another CT scan followup. Current Outpatient Prescriptions  Medication Sig Dispense Refill  . albuterol (PROAIR HFA) 108 (90 BASE) MCG/ACT inhaler Take 2 puffs up to four times daily as needed       . aspirin 81 MG tablet Take 81 mg by mouth daily.        Marland Kitchen buPROPion (WELLBUTRIN SR) 150 MG 12 hr tablet TAKE 1 TABLET BY MOUTH TWICE DAILY  60 tablet  11  . Cholecalciferol (VITAMIN D) 1000 UNITS capsule Take 1,000 Units by mouth daily.        . citalopram (CELEXA) 20 MG tablet Take 20 mg by mouth daily.        [name] Calcium (STOOL SOFTENER PO) Take by mouth. As needed       . fexofenadine (ALLEGRA) 180 MG tablet Take 180 mg by mouth daily.        . fluticasone (FLONASE) 50 MCG/ACT nasal spray 2 sprays by Nasal route daily.        Marland Kitchen gabapentin (NEURONTIN) 300 MG capsule Take 2 capsules by mouth four times per day.       . Glucosamine-Chondroit-Vit C-Mn (GLUCOSAMINE 1500 COMPLEX PO) Take 1 tablet by mouth daily.        . metformin (FORTAMET) 1000 MG (OSM) 24 hr tablet Take 1,000 mg by mouth 2 (two) times daily.        . Multiple Vitamin (MULTIVITAMIN) tablet Take 1 tablet by mouth daily.        . Omega-3 Fatty Acids (FISH OIL) 1000 MG CAPS Take 1 capsule by mouth daily.        Marland Kitchen omeprazole (PRILOSEC) 20 MG capsule Take 20 mg by mouth daily.        Marland Kitchen tiotropium (SPIRIVA) 18 MCG inhalation capsule Place 18 mcg into inhaler and inhale daily.           Review of Systems: No change   Physical Exam  Cardiovascular: Normal rate, regular rhythm and normal heart sounds.   Pulmonary/Chest: Effort normal and breath sounds normal. No respiratory distress.     Diagnostic Tests: CT scan shows no change in the right lower lobe nodule   Impression: Right lower lobe  nodule and other pulmonary nodule  Plan: Followup in 6 months

## 2010-12-21 ENCOUNTER — Ambulatory Visit
Admission: RE | Admit: 2010-12-21 | Discharge: 2010-12-21 | Disposition: A | Discharge status: Home / Self Care | Payer: Medicare Other | Source: Ambulatory Visit | Attending: Internal Medicine | Admitting: Internal Medicine

## 2010-12-21 DIAGNOSIS — Z1231 Encounter for screening mammogram for malignant neoplasm of breast: Secondary | ICD-10-CM

## 2010-12-23 ENCOUNTER — Encounter: Payer: Self-pay | Admitting: *Deleted

## 2011-01-01 ENCOUNTER — Ambulatory Visit (INDEPENDENT_AMBULATORY_CARE_PROVIDER_SITE_OTHER): Payer: Medicare Other | Admitting: Internal Medicine

## 2011-01-01 ENCOUNTER — Ambulatory Visit (INDEPENDENT_AMBULATORY_CARE_PROVIDER_SITE_OTHER)
Admission: RE | Admit: 2011-01-01 | Discharge: 2011-01-01 | Disposition: A | Discharge status: Home / Self Care | Payer: Medicare Other | Source: Ambulatory Visit | Attending: Internal Medicine | Admitting: Internal Medicine

## 2011-01-01 ENCOUNTER — Encounter: Payer: Self-pay | Admitting: Internal Medicine

## 2011-01-01 VITALS — BP 130/72 | HR 67 | Temp 97.8°F | Ht 64.0 in | Wt 142.2 lb

## 2011-01-01 DIAGNOSIS — J4489 Other specified chronic obstructive pulmonary disease: Secondary | ICD-10-CM

## 2011-01-01 DIAGNOSIS — J449 Chronic obstructive pulmonary disease, unspecified: Secondary | ICD-10-CM

## 2011-01-01 DIAGNOSIS — J984 Other disorders of lung: Secondary | ICD-10-CM

## 2011-01-01 MED ORDER — MOMETASONE FURO-FORMOTEROL FUM 200-5 MCG/ACT IN AERO: INHALATION_SPRAY | RESPIRATORY_TRACT | Status: DC

## 2011-01-01 NOTE — Progress Notes (Deleted)
dfsdd

## 2011-01-01 NOTE — Progress Notes (Signed)
Subjective:     Patient ID: [name], female   DOB: Mar 18, 1945, 65 y.o.   MRN: [id number]  HPI 31 yowf quit smoking 1999 got better then since then off and on cough = breathing maybe worse in fall and sometimes worse while sleeping not typically in am.   January 03, 2008 first pulmonary eval for cough minimally productive, not necessarily exacerbating in am or winter. doe > slow adl's. stopped advair and ramapril.   May 20, 2008 ov no limiting dyspea, minimal cough, variable sob much better after proaire. rec try off spiriva, seemed worse so restarted and occ need proaire.   December 27, 2008 doe x [name], can do grocery store or even mall if goes slow. Had abd pain > ct  abd pain now gone, but mpn detected.    01/01/2011 f/u ov/W[facility] cc doe x 2 weeks.  Baseline = ok walking slow flat and one flight of steps RA , indolent onset doe ok at rest  X 2 weeks and feeling a little tight with congested cough but no lateralizing or localized cp. Takes prilosec p meals, proaire not needing before but now helps significantly and uses several times a day with very poor technique.  No purulent sputum.  Sleeping ok without nocturnal  or early am exacerbation  of respiratory  c/o's or need for noct saba. Also denies any obvious fluctuation of symptoms with weather or environmental changes or other aggravating or alleviating factors except as outlined above   ROS  At present neg for  any significant sore throat, dysphagia, itching, sneezing,  nasal congestion or excess/ purulent secretions,  fever, chills, sweats, unintended wt loss, pleuritic or exertional cp, hempoptysis, orthopnea pnd or leg swelling.  Also denies presyncope, palpitations, heartburn, abdominal pain, nausea, vomiting, diarrhea  or change in bowel or urinary habits, dysuria,hematuria,  rash, arthralgias, visual complaints, headache, numbness weakness or ataxia.      Past Medical History:  Allergic rhinitis  Asthma  Diabetes  mellitus, type II with neuropathy  GERD  HX GASTROPARESIS  DIVERTICULOSIS  Anxiety  Depression  Breast cancer, hx of  Colonic polyps, hx of/ADENOMATOUS 2007  Hyperlipidemia  COPD  - PFT's 2/07 FEV1 .79  - PFT's 02/15/08 FEV1 .97 (45%) ratio 49, 11% better after Beta 2, DLCO 48%  Multiple Pulmonary Nodules  - CT 12/24/08 assoc with R renal mass   Past Surgical History:  Cholecystectomy--1975  Tonsillectomy/A--1953denoidectomy  Lumpectomy--right breast 1998 no positive nodes RT only  Lung carcinoid removed (2)--1994 [facility]    Review of Systems     Objective:   Physical Exam amb wf nad at rest with ok sat 92 p arriving with sat 77% walking in from lobby wt 145> 146 02/15/08 >152 May 20, 2008 > 150 December 27, 2008 > 01/01/2011  142 HEENT mild turbinate edema. Oropharynx no thrush or excess pnd or cobblestoning. No JVD or cervical adenopathy. Mild accessory muscle hypertrophy. Trachea midline, nl thryroid. Chest was hyperinflated by percussion with diminished breath sounds and moderate increased exp time without wheeze. Hoover sign positive at mid inspiration. Regular rate and rhythm without murmur gallop or rub or increase P2. No edema Abd: no hsm, nl excursion. Ext warm without cyanosis or clubbing    Comparison: CT chest dated 12/02/2010  Findings: Hyperinflation. Sutures/postsurgical changes in the right mid lung. Increased interstitial markings/lower lobe opacities. Mild nodularity in the bilateral lungs, better demonstrated on prior CT. No pleural effusion or pneumothorax.  Cardiomediastinal silhouette is within normal limits. Visualized  osseous structures are within normal limits.  IMPRESSION: Postsurgical changes in the right mid lung.  Increased interstitial markings/lower lobe opacities with nodularity in the bilateral lungs, better demonstrated on prior CT, possibly reflecting atypical infection such as MAI.   Assessment:      Plan:

## 2011-01-01 NOTE — Patient Instructions (Addendum)
Work on inhaler technique:  relax and gently blow all the way out then take a nice smooth deep breath back in, triggering the inhaler at same time you start breathing in.  Hold for up to 5 seconds if you can.  Rinse and gargle with water when done   If your mouth or throat starts to bother you,   I suggest you time the inhaler to your dental care and after using the inhaler(s) brush teeth and tongue with a baking soda containing toothpaste and when you rinse this out, gargle with it first to see if this helps your mouth and throat.     Start dulera 200 Take 2 puffs first thing in am and then another 2 puffs about 12 hours later.   Only use your albuterol(proaire, your red inhaler)  as a rescue medication to be used if you can't catch your breath by resting or doing a relaxed purse lip breathing pattern. The less you use it, the better it will work when you need it.   Prednisone 10 mg take  4 each am x 2 days,   2 each am x 2 days,  1 each am x2days and stop   Prilosec Take 30-60 min before first meal of the day   GERD (REFLUX)  is an extremely common cause of respiratory symptoms, many times with no significant heartburn at all.    It can be treated with medication, but also with lifestyle changes including avoidance of late meals, excessive alcohol, smoking cessation, and avoid fatty foods, chocolate, peppermint, colas, red wine, and acidic juices such as orange juice.  NO MINT OR MENTHOL PRODUCTS SO NO COUGH DROPS  USE SUGARLESS CANDY INSTEAD (jolley ranchers or Stover's)  NO OIL BASED VITAMINS - use powdered substitutes. NO FISH OIL  Please remember to go to the  x-ray department downstairs for your tests - we will call you with the results when then are available.   Please schedule a follow up office visit in 2 weeks, sooner if needed

## 2011-01-02 ENCOUNTER — Encounter: Payer: Self-pay | Admitting: Internal Medicine

## 2011-01-02 NOTE — Assessment & Plan Note (Addendum)
Flaring on spiriva so add dulera 200 trial  The proper method of use, as well as anticipated side effects, of this metered-dose inhaler are discussed and demonstrated to the patient. Improved but only to 50% may consider advair next ov if not able to do better with hfa.  DDX of  difficult airways managment all start with A and  include Adherence, Ace Inhibitors, Acid Reflux, Active Sinus Disease, Alpha 1 Antitripsin deficiency, Anxiety masquerading as Airways dz,  ABPA,  allergy(esp in young), Aspiration (esp in elderly), Adverse effects of DPI,  Active smokers, plus two Bs  = Bronchiectasis and Beta blocker use..and one C= CHF   Adherence is always the initial "prime suspect" and is a multilayered concern that requires a "trust but verify" approach in every patient - starting with knowing how to use medications, especially inhalers, correctly, keeping up with refills and understanding the fundamental difference between maintenance and prns vs those medications only taken for a very short course and then stopped and not refilled. See instructions for specific recommendations which were reviewed directly with the patient who was given a copy with highlighter outlining the key components.   ? Acid reflux: stop fish oil as reported to have gastroparesis > diet reviewed

## 2011-01-02 NOTE — Assessment & Plan Note (Signed)
Ct and cxr's reviewed, she probably has MAI with ? Whether it's actually contributing to any of her symptoms, will follow.

## 2011-01-04 ENCOUNTER — Telehealth: Payer: Self-pay | Admitting: Internal Medicine

## 2011-01-04 MED ORDER — PREDNISONE 10 MG PO TABS: ORAL_TABLET | ORAL | Status: DC

## 2011-01-04 NOTE — Telephone Encounter (Signed)
Pt is scheduled for f/u with MS on 01/15/2011. Prednisone rx never sent to the pharmacy on Fri., 10/26. Pt aware we will send rx to the pharmacy for her today. She was also notified of her cxr results per Mw.

## 2011-01-15 ENCOUNTER — Ambulatory Visit (INDEPENDENT_AMBULATORY_CARE_PROVIDER_SITE_OTHER): Payer: Medicare Other | Admitting: Internal Medicine

## 2011-01-15 ENCOUNTER — Encounter: Payer: Self-pay | Admitting: Internal Medicine

## 2011-01-15 VITALS — BP 130/70 | HR 67 | Temp 97.5°F | Ht 63.0 in | Wt 142.2 lb

## 2011-01-15 DIAGNOSIS — J984 Other disorders of lung: Secondary | ICD-10-CM

## 2011-01-15 DIAGNOSIS — J449 Chronic obstructive pulmonary disease, unspecified: Secondary | ICD-10-CM

## 2011-01-15 MED ORDER — MOMETASONE FURO-FORMOTEROL FUM 200-5 MCG/ACT IN AERO: INHALATION_SPRAY | RESPIRATORY_TRACT | Status: DC

## 2011-01-15 NOTE — Assessment & Plan Note (Addendum)
The proper method of use, as well as anticipated side effects, of this metered-dose inhaler are discussed and demonstrated to the patient. Improved to 75%.  She still desats walking but declines 02 and is doing much better overall so no change rx needed for now.    Each maintenance medication was reviewed in detail including most importantly the difference between maintenance and as needed and under what circumstances the prns are to be used.  Please see instructions for details which were reviewed in writing and the patient given a copy.  See instructions for specific recommendations which were reviewed directly with the patient who was given a copy with highlighter outlining the key components.

## 2011-01-15 NOTE — Progress Notes (Signed)
Subjective:     Patient ID: [name], female   DOB: Jul 23, 1945, 65 y.o.   MRN: [id number]  HPI 35 yowf quit smoking 1999 got better then since then off and on cough = breathing maybe worse in fall and sometimes worse while sleeping not typically in am.   January 03, 2008 first pulmonary eval for cough minimally productive, not necessarily exacerbating in am or winter. doe > slow adl's. stopped advair and ramapril.   May 20, 2008 ov no limiting dyspea, minimal cough, variable sob much better after proaire. rec try off spiriva, seemed worse so restarted and occ need proaire.   December 27, 2008 doe x [name], can do grocery store or even mall if goes slow. Had abd pain > ct  abd pain now gone, but mpn detected.    01/01/2011 f/u ov/[name] cc doe x 2 weeks.  Baseline = ok walking slow flat and one flight of steps RA , indolent onset doe ok at rest  X 2 weeks and feeling a little tight with congested cough but no lateralizing or localized cp. Takes prilosec p meals, proaire not needing before but now helps significantly and uses several times a day with very poor technique.  No purulent sputum. 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  Only use your albuterol(proaire, your red inhaler)  as a rescue medication to be used if you can't catch your breath by resting or doing a relaxed purse lip breathing pattern. The less you use it, the better it will work when you need it. Prednisone 10 mg take  4 each am x 2 days,   2 each am x 2 days,  1 each am x2days and stop  Prilosec Take 30-60 min before first meal of the day  GERD diet     01/15/2011 f/u ov/[name] cc doe much better to point where can do yardwork, cough each am x 10 min min prod white mucus, rarely uses rescue if at all  Sleeping ok without nocturnal  or early am exacerbation  of respiratory  c/o's or need for noct saba. Also denies any obvious  fluctuation of symptoms with weather or environmental changes or other aggravating or alleviating factors except as outlined above   ROS  At present neg for  any significant sore throat, dysphagia, itching, sneezing,  nasal congestion or excess/ purulent secretions,  fever, chills, sweats, unintended wt loss, pleuritic or exertional cp, hempoptysis, orthopnea pnd or leg swelling.  Also denies presyncope, palpitations, heartburn, abdominal pain, nausea, vomiting, diarrhea  or change in bowel or urinary habits, dysuria,hematuria,  rash, arthralgias, visual complaints, headache, numbness weakness or ataxia.      Past Medical History:  Allergic rhinitis  Asthma  Diabetes mellitus, type II with neuropathy  GERD  HX GASTROPARESIS  DIVERTICULOSIS  Anxiety  Depression  Breast cancer, hx of  Colonic polyps, hx of/ADENOMATOUS 2007  Hyperlipidemia  COPD  - PFT's 2/07 FEV1 .79  - PFT's 02/15/08 FEV1 .97 (45%) ratio 49, 11% better after Beta 2, DLCO 48%  Multiple Pulmonary Nodules  - CT 12/24/08 assoc with R renal mass   Past Surgical History:  Cholecystectomy--1975  Tonsillectomy/A--1953denoidectomy  Lumpectomy--right breast 1998 no positive nodes RT only  Lung carcinoid removed (2)--1994 [facility]    Review of Systems     Objective:   Physical Exam  amb wf nad   wt 145>  146 02/15/08 >152 May 20, 2008  > 01/01/2011  142 > 142 01/15/2011  HEENT mild turbinate edema. Oropharynx no thrush or excess pnd or cobblestoning. No JVD or cervical adenopathy. Mild accessory muscle hypertrophy. Trachea midline, nl thryroid. Chest was hyperinflated by percussion with diminished breath sounds and moderate increased exp time without wheeze. Hoover sign positive at mid inspiration. Regular rate and rhythm without murmur gallop or rub or increase P2. No edema Abd: no hsm, nl excursion. Ext warm without cyanosis or clubbing     CR 01/01/11 Comparison: CT chest dated 12/02/2010 Postsurgical changes in the  right mid lung.  Increased interstitial markings/lower lobe opacities with nodularity in the bilateral lungs, better demonstrated on prior CT, possibly reflecting atypical infection such as MAI.   Assessment:      Plan:

## 2011-01-15 NOTE — Patient Instructions (Signed)
Ok to leave same off spiriva but if you notice any reduction in activity tolerance resume it  Please schedule a follow up visit in 2 months but call sooner if needed cxr and pfts

## 2011-01-16 NOTE — Assessment & Plan Note (Signed)
No gross evidence of progression, f/u cxr and pft's planned in 2 months

## 2011-01-26 ENCOUNTER — Telehealth: Payer: Self-pay | Admitting: Internal Medicine

## 2011-01-26 MED ORDER — MOMETASONE FURO-FORMOTEROL FUM 200-5 MCG/ACT IN AERO: INHALATION_SPRAY | RESPIRATORY_TRACT | Status: DC

## 2011-01-26 NOTE — Telephone Encounter (Signed)
POt stated that she is going to use a mail order for her Rx.  [facility], [facility].  Their fax number is [phone].  Stated the Rx needs to read 3 by 13 grams 90 day refill 3.  Pt's customer number is [id number].  [name]

## 2011-01-26 NOTE — Telephone Encounter (Signed)
I spoke with pt and states she would like her dulera rx sent to [facility] instead of local pharmacy. I advised pt will send rx and nothing further was needed

## 2011-02-09 ENCOUNTER — Encounter: Payer: Self-pay | Admitting: Internal Medicine

## 2011-02-09 ENCOUNTER — Ambulatory Visit (INDEPENDENT_AMBULATORY_CARE_PROVIDER_SITE_OTHER): Payer: Medicare Other | Admitting: Internal Medicine

## 2011-02-09 VITALS — BP 125/64 | HR 72 | Temp 98.1°F | Ht 63.0 in | Wt 140.0 lb

## 2011-02-09 DIAGNOSIS — F329 Major depressive disorder, single episode, unspecified: Secondary | ICD-10-CM

## 2011-02-09 DIAGNOSIS — E1149 Type 2 diabetes mellitus with other diabetic neurological complication: Secondary | ICD-10-CM

## 2011-02-09 DIAGNOSIS — F0150 Vascular dementia without behavioral disturbance: Secondary | ICD-10-CM | POA: Insufficient documentation

## 2011-02-09 DIAGNOSIS — G3184 Mild cognitive impairment, so stated: Secondary | ICD-10-CM

## 2011-02-09 DIAGNOSIS — Z23 Encounter for immunization: Secondary | ICD-10-CM

## 2011-02-09 DIAGNOSIS — E785 Hyperlipidemia, unspecified: Secondary | ICD-10-CM

## 2011-02-09 MED ORDER — ATORVASTATIN CALCIUM 40 MG PO TABS: 40.0000 mg | ORAL_TABLET | Freq: Every day | ORAL | Status: DC

## 2011-02-09 NOTE — Progress Notes (Signed)
Subjective:    Patient ID: [name], female    DOB: 21-Sep-1945, 65 y.o.   MRN: [id number]  HPI Doing fairly well Still with some cognitive issues Had neuropsychiatric evaluation with diagnosis of mild cognitive impairment---multiple domain Suggestive of ischemic cause  Is on aspirin Discussed restarting statins---was on crestor in past No longer smokes---though occ breaks down and has one  Checks sugars occ They are "good" but doesn't remember numbers No hypoglycemic reactions On gabapentin for painful feet. Still with pain and numbness but tolerable  Breathing is better on new regimen  Mood is good Not depressed Enjoys things regularly No trouble with anxiety  Current Outpatient Prescriptions on File Prior to Visit  Medication Sig Dispense Refill  . albuterol (PROAIR HFA) 108 (90 BASE) MCG/ACT inhaler Take 2 puffs up to four times daily as needed       . aspirin 81 MG tablet Take 81 mg by mouth daily.        Marland Kitchen buPROPion (WELLBUTRIN SR) 150 MG 12 hr tablet TAKE 1 TABLET BY MOUTH TWICE DAILY  60 tablet  11  . Cholecalciferol (VITAMIN D) 1000 UNITS capsule Take 1,000 Units by mouth daily.        . citalopram (CELEXA) 20 MG tablet Take 20 mg by mouth daily.        [name] Calcium (STOOL SOFTENER PO) Take by mouth. As needed       . fexofenadine (ALLEGRA) 180 MG tablet Take 180 mg by mouth daily.        . fluticasone (FLONASE) 50 MCG/ACT nasal spray 2 sprays by Nasal route daily.        Marland Kitchen gabapentin (NEURONTIN) 300 MG capsule Take 2 capsules by mouth four times per day.       . Glucosamine-Chondroit-Vit C-Mn (GLUCOSAMINE 1500 COMPLEX PO) Take 1 tablet by mouth daily.        . metformin (FORTAMET) 1000 MG (OSM) 24 hr tablet Take 1,000 mg by mouth 2 (two) times daily.        . Mometasone Furo-Formoterol Fum (DULERA) 200-5 MCG/ACT AERO Take 2 puffs first thing in am and then another 2 puffs about 12 hours later.  3 Inhaler  4  . Multiple Vitamin (MULTIVITAMIN) tablet Take 1  tablet by mouth daily.        Marland Kitchen omeprazole (PRILOSEC) 20 MG capsule Take 20 mg by mouth daily.        Marland Kitchen tiotropium (SPIRIVA) 18 MCG inhalation capsule Place 18 mcg into inhaler and inhale daily.          Allergies  Allergen Reactions  . Penicillins     REACTION: rash  . Symbicort     REACTION: coughed worse    Past Medical History  Diagnosis Date  . Renal cell carcinoma   . Asthma   . Diabetes mellitus type II   . GERD (gastroesophageal reflux disease)   . Gastroparesis   . Diverticulosis   . Anxiety   . Depression   . History of breast cancer   . History of adenomatous polyp of colon   . Hyperlipidemia   . COPD (chronic obstructive pulmonary disease)   . Pulmonary nodule     multiple  . Osteoarthritis   . Carcinoid tumor of lung 1991  . Mild cognitive impairment     Past Surgical History  Procedure Date  . Cholecystectomy 1975  . Tonsillectomy and adenoidectomy 1953  . Breast lumpectomy 1998    right breast  .  Lung surgery 1994    carcinoid removal  . Laparoscopic nephrectomy 01/2009    right    Family History  Problem Relation Age of Onset  . Colon cancer Father   . Lung cancer Father   . Stroke Mother     History   Social History  . Marital Status: Married    Spouse Name: N/A    Number of Children: 2  . Years of Education: N/A   Occupational History  . retired Photographer    Social History Main Topics  . Smoking status: Former Smoker -- 1.0 packs/day for 30 years    Types: Cigarettes    Quit date: 03/08/1993  . Smokeless tobacco: Never Used  . Alcohol Use: No  . Drug Use: No  . Sexually Active: Not on file   Other Topics Concern  . Not on file   Social History Narrative   Restarted with stress in 6/11Daily Caffeine UsePatient does not get regular exercise.    Review of Systems No incontinence No trouble with walking or balance    Objective:   Physical Exam  Constitutional: She appears well-developed and well-nourished. No distress.    Neck: Normal range of motion. Neck supple. No thyromegaly present.  Cardiovascular: Normal rate, regular rhythm, normal heart sounds and intact distal pulses.  Exam reveals no gallop.   No murmur heard. Pulmonary/Chest: Effort normal and breath sounds normal. No respiratory distress. She has no wheezes. She has no rales.  Musculoskeletal: She exhibits no edema and no tenderness.  Lymphadenopathy:    She has no cervical adenopathy.  Skin: No rash noted.       No foot lesions  Psychiatric: She has a normal mood and affect. Her behavior is normal. Judgment and thought content normal.          Assessment & Plan:

## 2011-02-09 NOTE — Assessment & Plan Note (Signed)
Has had good control No changes needed Lab Results  Component Value Date   HGBA1C 6.0 08/10/2010   Check with blood work coming up

## 2011-02-09 NOTE — Patient Instructions (Signed)
Please start the new cholesterol medication, atorvastatin Please set up lab work in about 6 weeks--- lipid, hepatic (272.4), HgbA1c (250.60)

## 2011-02-09 NOTE — Assessment & Plan Note (Signed)
Probably vascular Will restart statin and assess for worsening No MRI needed unless worsens Gabapentin could be problem but needs for neuropathy Recommend yearly neuropsych evals

## 2011-02-09 NOTE — Assessment & Plan Note (Signed)
Good control.  Continue current meds 

## 2011-02-09 NOTE — Assessment & Plan Note (Signed)
Given diabetes and probably vascular based MCI, will restart statin

## 2011-03-16 ENCOUNTER — Other Ambulatory Visit: Payer: Self-pay | Admitting: Internal Medicine

## 2011-03-16 DIAGNOSIS — E1149 Type 2 diabetes mellitus with other diabetic neurological complication: Secondary | ICD-10-CM

## 2011-03-16 DIAGNOSIS — E785 Hyperlipidemia, unspecified: Secondary | ICD-10-CM

## 2011-03-18 ENCOUNTER — Ambulatory Visit (INDEPENDENT_AMBULATORY_CARE_PROVIDER_SITE_OTHER)
Admission: RE | Admit: 2011-03-18 | Discharge: 2011-03-18 | Disposition: A | Discharge status: Home / Self Care | Payer: Medicare Other | Source: Ambulatory Visit | Attending: Internal Medicine | Admitting: Internal Medicine

## 2011-03-18 ENCOUNTER — Ambulatory Visit (INDEPENDENT_AMBULATORY_CARE_PROVIDER_SITE_OTHER): Payer: Medicare Other | Admitting: Internal Medicine

## 2011-03-18 ENCOUNTER — Encounter: Payer: Self-pay | Admitting: Internal Medicine

## 2011-03-18 VITALS — BP 124/70 | HR 70 | Temp 98.0°F | Ht 64.0 in | Wt 141.0 lb

## 2011-03-18 DIAGNOSIS — J8410 Pulmonary fibrosis, unspecified: Secondary | ICD-10-CM

## 2011-03-18 DIAGNOSIS — J449 Chronic obstructive pulmonary disease, unspecified: Secondary | ICD-10-CM | POA: Diagnosis not present

## 2011-03-18 DIAGNOSIS — R918 Other nonspecific abnormal finding of lung field: Secondary | ICD-10-CM | POA: Diagnosis not present

## 2011-03-18 DIAGNOSIS — J45909 Unspecified asthma, uncomplicated: Secondary | ICD-10-CM

## 2011-03-18 DIAGNOSIS — J984 Other disorders of lung: Secondary | ICD-10-CM | POA: Diagnosis not present

## 2011-03-18 LAB — PULMONARY FUNCTION TEST

## 2011-03-18 NOTE — Assessment & Plan Note (Signed)
She may well have underlying PF or even MAI but cough is gone, breathing is better and she is not inclined to escalate or complicate rx at this point and I see no benefit to any form of "early intervention" other than treating her airways dz aggressively   Discussed in detail all the  indications, usual  risks and alternatives  relative to the benefits with patient who agrees to proceed with conservative rx

## 2011-03-18 NOTE — Progress Notes (Signed)
PFT done today. 

## 2011-03-18 NOTE — Patient Instructions (Signed)
Return here yearly or to the [facility] pulmonary clinic [facility]) - sooner if needed.

## 2011-03-18 NOTE — Progress Notes (Signed)
Subjective:     Patient ID: [name], female   DOB: 1945-08-12    MRN: [id number]  HPI  4 yowf quit smoking 1999 got better then since then off and on cough and sob maybe worse in fall and sometimes worse while sleeping not typically in am's dx with GOLD III copd 04/2005  January 03, 2008 first pulmonary eval for cough minimally productive, not necessarily exacerbating in am or winter. doe > slow adl's. stopped advair and ramapril.   May 20, 2008 ov no limiting dyspea, minimal cough, variable sob much better after proaire. rec try off spiriva, seemed worse so restarted and occ need proaire.   01/01/2011 f/u ov/[name] cc doe x 2 weeks.  Baseline = ok walking slow flat and one flight of steps RA , indolent onset doe ok at rest  X 2 weeks and feeling a little tight with congested cough but no lateralizing or localized cp. Takes prilosec p meals, proaire not needing before but now helps significantly and uses several times a day with very poor technique.  No purulent sputum. 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  Only use your albuterol(proaire, your red inhaler)  as a rescue medication to be used if you can't catch your breath by resting or doing a relaxed purse lip breathing pattern. The less you use it, the better it will work when you need it. Prednisone 10 mg take  4 each am x 2 days,   2 each am x 2 days,  1 each am x2days and stop  Prilosec Take 30-60 min before first meal of the day  GERD diet     01/15/2011 f/u ov/[name] cc doe much better to point where can do yardwork, cough each am x 10 min min prod white mucus, rarely uses rescue if at all rec Ok to leave same off spiriva but if you notice any reduction in activity tolerance resume it> never tried this     03/18/2011 f/u ov/[name] cc  No change doe, cough almost resolved, really thinks dulera helping   Sleeping ok without nocturnal  or  early am exacerbation  of respiratory  c/o's or need for noct saba. Also denies any obvious fluctuation of symptoms with weather or environmental changes or other aggravating or alleviating factors except as outlined above   ROS  At present neg for  any significant sore throat, dysphagia, itching, sneezing,  nasal congestion or excess/ purulent secretions,  fever, chills, sweats, unintended wt loss, pleuritic or exertional cp, hempoptysis, orthopnea pnd or leg swelling.  Also denies presyncope, palpitations, heartburn, abdominal pain, nausea, vomiting, diarrhea  or change in bowel or urinary habits, dysuria,hematuria,  rash, arthralgias, visual complaints, headache, numbness weakness or ataxia.      Past Medical History:  Allergic rhinitis  Asthma  Diabetes mellitus, type II with neuropathy  GERD  HX GASTROPARESIS  DIVERTICULOSIS  Anxiety  Depression  Breast cancer, hx of  Colonic polyps, hx of/ADENOMATOUS 2007  Hyperlipidemia  COPD  - PFT's 2/07 FEV1 .79  - PFT's 02/15/08 FEV1 .97 (45%) ratio 49, 11% better after Beta 2, DLCO 48%  Multiple Pulmonary Nodules  - CT 12/24/08 assoc with R renal mass   Past Surgical History:  Cholecystectomy--1975  Tonsillectomy/A--1953denoidectomy  Lumpectomy--right breast 1998 no positive nodes RT only  Lung carcinoid removed (2)--1994 [facility]        Objective:  Physical Exam  amb wf nad   wt 145> 146 02/15/08 >152 May 20, 2008  > 01/01/2011  142 > 142 01/15/2011 > 03/18/2011  141 HEENT mild turbinate edema. Oropharynx no thrush or excess pnd or cobblestoning. No JVD or cervical adenopathy. Mild accessory muscle hypertrophy. Trachea midline, nl thryroid. Chest was hyperinflated by percussion with diminished breath sounds and moderate increased exp time without wheeze. Hoover sign positive at mid inspiration. Regular rate and rhythm without murmur gallop or rub or increase P2. No edema Abd: no hsm, nl excursion. Ext warm without cyanosis or  clubbing      CXR  03/18/2011 :  No significant change in chronic bronchitic changes and nodularity  bilaterally.   Assessment:      Plan:

## 2011-03-18 NOTE — Assessment & Plan Note (Signed)
-   PFT's 2/07 FEV1 .79  - PFT's 02/15/08 FEV1 .97 (45%) ratio 49, 11% better after Beta 2, DLCO 48% - PFT's 03/18/2011  FEV1 0.97 (47%) ratio 52 and no change p B2,  DLCO 37 corrects to 72% - HFA 90% 03/18/2011  - 01/15/2011  Walked RA x 1 laps @ 185 ft each stopped due to  desat to 88 p 1 lap> declined 02   She has GOLD III copd with reduction in dlco and ex desats on this basis but symptomatically much better on present rx and declined 02 again today.  The proper method of use, as well as anticipated side effects, of this metered-dose inhaler are discussed and demonstrated to the patient. Improved to 90% with extensive coaching    Each maintenance medication was reviewed in detail including most importantly the difference between maintenance and as needed and under what circumstances the prns are to be used.  Please see instructions for details which were reviewed in writing and the patient given a copy.

## 2011-03-23 ENCOUNTER — Other Ambulatory Visit (INDEPENDENT_AMBULATORY_CARE_PROVIDER_SITE_OTHER): Payer: Medicare Other

## 2011-03-23 ENCOUNTER — Encounter: Payer: Self-pay | Admitting: Internal Medicine

## 2011-03-23 DIAGNOSIS — E1149 Type 2 diabetes mellitus with other diabetic neurological complication: Secondary | ICD-10-CM | POA: Diagnosis not present

## 2011-03-23 DIAGNOSIS — E785 Hyperlipidemia, unspecified: Secondary | ICD-10-CM

## 2011-03-23 LAB — LIPID PANEL
LDL Cholesterol: 74 mg/dL (ref 0–99)
Total CHOL/HDL Ratio: 3

## 2011-03-23 LAB — HEPATIC FUNCTION PANEL
Alkaline Phosphatase: 59 U/L (ref 39–117)
Bilirubin, Direct: 0 mg/dL (ref 0.0–0.3)
Total Bilirubin: 0.7 mg/dL (ref 0.3–1.2)

## 2011-04-02 ENCOUNTER — Encounter: Payer: Self-pay | Admitting: Internal Medicine

## 2011-04-05 ENCOUNTER — Telehealth: Payer: Self-pay | Admitting: Internal Medicine

## 2011-04-06 ENCOUNTER — Other Ambulatory Visit: Payer: Self-pay | Admitting: Internal Medicine

## 2011-04-06 MED ORDER — OMEPRAZOLE 20 MG PO CPDR: 20.0000 mg | DELAYED_RELEASE_CAPSULE | Freq: Every day | ORAL | Status: DC

## 2011-04-06 MED ORDER — TIOTROPIUM BROMIDE MONOHYDRATE 18 MCG IN CAPS: 18.0000 ug | ORAL_CAPSULE | Freq: Every day | RESPIRATORY_TRACT | Status: DC

## 2011-04-06 MED ORDER — METFORMIN HCL ER (OSM) 1000 MG PO TB24: 1000.0000 mg | ORAL_TABLET | Freq: Two times a day (BID) | ORAL | Status: DC

## 2011-04-06 MED ORDER — GABAPENTIN 300 MG PO CAPS: 600.0000 mg | ORAL_CAPSULE | Freq: Four times a day (QID) | ORAL | Status: DC

## 2011-04-06 MED ORDER — ATORVASTATIN CALCIUM 40 MG PO TABS: 40.0000 mg | ORAL_TABLET | Freq: Every day | ORAL | Status: DC

## 2011-04-06 MED ORDER — CITALOPRAM HYDROBROMIDE 20 MG PO TABS: 20.0000 mg | ORAL_TABLET | Freq: Every day | ORAL | Status: DC

## 2011-04-06 NOTE — Telephone Encounter (Signed)
Need Rx refills for the medication for 3 mth supply, sent to [facility] via computer.Marland Kitchen Pt ID# for [facility] [id number] Enrollment code [id number]

## 2011-04-06 NOTE — Telephone Encounter (Signed)
rx sent to pharmacy by e-script  

## 2011-04-06 NOTE — Telephone Encounter (Signed)
Spoke with patient about labs and she lost her lab card. rx sent to pharmacy by e-script for lipitor

## 2011-04-09 ENCOUNTER — Ambulatory Visit (AMBULATORY_SURGERY_CENTER): Payer: Medicare Other | Admitting: *Deleted

## 2011-04-09 VITALS — Ht 63.0 in | Wt 143.0 lb

## 2011-04-09 DIAGNOSIS — Z860100 Personal history of colon polyps, unspecified: Secondary | ICD-10-CM

## 2011-04-09 DIAGNOSIS — Z1211 Encounter for screening for malignant neoplasm of colon: Secondary | ICD-10-CM

## 2011-04-09 MED ORDER — PEG-KCL-NACL-NASULF-NA ASC-C 100 G PO SOLR: ORAL | Status: DC

## 2011-04-21 ENCOUNTER — Other Ambulatory Visit: Payer: Self-pay | Admitting: Thoracic Surgery

## 2011-04-21 DIAGNOSIS — D381 Neoplasm of uncertain behavior of trachea, bronchus and lung: Secondary | ICD-10-CM

## 2011-04-23 ENCOUNTER — Encounter: Payer: Self-pay | Admitting: Internal Medicine

## 2011-04-23 ENCOUNTER — Ambulatory Visit (AMBULATORY_SURGERY_CENTER): Payer: Medicare Other | Admitting: Internal Medicine

## 2011-04-23 VITALS — BP 168/109 | HR 70 | Temp 98.2°F | Resp 20 | Ht 63.0 in | Wt 143.0 lb

## 2011-04-23 DIAGNOSIS — Z860100 Personal history of colon polyps, unspecified: Secondary | ICD-10-CM

## 2011-04-23 DIAGNOSIS — K633 Ulcer of intestine: Secondary | ICD-10-CM | POA: Diagnosis not present

## 2011-04-23 DIAGNOSIS — D378 Neoplasm of uncertain behavior of other specified digestive organs: Secondary | ICD-10-CM | POA: Diagnosis not present

## 2011-04-23 DIAGNOSIS — F411 Generalized anxiety disorder: Secondary | ICD-10-CM | POA: Diagnosis not present

## 2011-04-23 DIAGNOSIS — Z1211 Encounter for screening for malignant neoplasm of colon: Secondary | ICD-10-CM | POA: Diagnosis not present

## 2011-04-23 DIAGNOSIS — D375 Neoplasm of uncertain behavior of rectum: Secondary | ICD-10-CM

## 2011-04-23 DIAGNOSIS — D371 Neoplasm of uncertain behavior of stomach: Secondary | ICD-10-CM

## 2011-04-23 DIAGNOSIS — D126 Benign neoplasm of colon, unspecified: Secondary | ICD-10-CM

## 2011-04-23 LAB — GLUCOSE, CAPILLARY: Glucose-Capillary: 93 mg/dL (ref 70–99)

## 2011-04-23 MED ORDER — SODIUM CHLORIDE 0.9 % IV SOLN: 500.0000 mL | INTRAVENOUS | Status: DC

## 2011-04-23 NOTE — Op Note (Signed)
[facility] [address] [place], [place]  [address]  COLONOSCOPY PROCEDURE REPORT  PATIENT:  Tracy, Hill  MR#:  [id number] BIRTHDATE:  1946/02/18, 65 yrs. old  GENDER:  female ENDOSCOPIST:  [name]. [name], MD REF. BY: PROCEDURE DATE:  04/23/2011 PROCEDURE:  Colonoscopy with biopsy and snare polypectomy ASA CLASS:  Class II INDICATIONS:  history of pre-cancerous (adenomatous) colon polyps adenom. polyps in 2004, aden and hyperplastic in 2007, RLQ abd. pain, daughter with Crohn's disease MEDICATIONS:   MAC sedation, administered by CRNA, propofol (Diprivan) 330 mg  DESCRIPTION OF PROCEDURE:   After the risks and benefits and of the procedure were explained, informed consent was obtained. Digital rectal exam was performed and revealed no rectal masses. The LB [id number]-[id number] endoscope was introduced through the anus and advanced to the terminal ileum which was intubated for a short distance.  The quality of the prep was good, using MoviPrep.  The instrument was then slowly withdrawn as the colon was fully examined. <<PROCEDUREIMAGES>>  FINDINGS:  An ulcer was found at the ileocecal valve. large superficial ilcer on one leaf of the ileocecal valve, initially thought to resemle a mass but the biopsies showed it to be very soft and pliable Multiple biopsies were obtained and sent to pathology (see image4, image5, image7, image8, image9, image11, image12, image13, image15, and image14).  The terminal ileum appeared normal. With standard forceps, biopsy was obtained and sent to pathology (see image10).  Three polyps were found. at20 25 cm 3-4 mm polyps The polyp was removed using cold biopsy forceps. Polyp was snared without cautery. Retrieval was successful (see image17, image19, and image18). snare polyp  Mild diverticulosis was found in the sigmoid colon (see image16, image1, and image2). This was otherwise a normal examination of the colon (see  image6, image15, and image20).   Retroflexed views in the rectum revealed no abnormalities.    The scope was then withdrawn from the patient and the procedure completed.  COMPLICATIONS:  None ENDOSCOPIC IMPRESSION: 1) Ulcer at the ileocecal valve 2) Normal terminal ileum 3) Three polyps 4) Mild diverticulosis in the sigmoid colon 5) Otherwise normal examination RECOMMENDATIONS: 1) Await pathology results 2) High fiber diet.  REPEAT EXAM:  In 5 year(s) for.  ______________________________ [name]. [name], MD  CC:  n. eSIGNED:   [name]. Kaiden Dardis at 04/23/2011 11:04 AM  Page 2 of 3   [place], [place], [id number]

## 2011-04-23 NOTE — Patient Instructions (Signed)
Discharge instructions per blue and green sheets  Handouts given on polyps, diverticulosis, high fiber diet  We will mail you a letter in 1-2 weeks with the pathology results and dr brodies recommendations from this point.  Repeat exam in 5 years-2018 . We will mail you a letter to remind you of this.

## 2011-04-23 NOTE — Progress Notes (Signed)
Patient did not experience any of the following events: a burn prior to discharge; a fall within the facility; wrong site/side/patient/procedure/implant event; or a hospital transfer or hospital admission upon discharge from the facility. (G8907) Patient did not have preoperative order for IV antibiotic SSI prophylaxis. (G8918)  

## 2011-04-26 ENCOUNTER — Telehealth: Payer: Self-pay | Admitting: *Deleted

## 2011-04-26 NOTE — Telephone Encounter (Signed)
  Follow up Call-  Call back number 04/23/2011  Post procedure Call Back phone  # [phone]  Permission to leave phone message Yes     Patient questions:  Do you have a fever, pain , or abdominal swelling? no Pain Score  0 *  Have you tolerated food without any problems? yes  Have you been able to return to your normal activities? yes  Do you have any questions about your discharge instructions: Diet   no Medications  no Follow up visit  no  Do you have questions or concerns about your Care? no  Actions: * If pain score is 4 or above: No action needed, pain <4.

## 2011-04-28 ENCOUNTER — Encounter: Payer: Self-pay | Admitting: Internal Medicine

## 2011-05-20 DIAGNOSIS — K5730 Diverticulosis of large intestine without perforation or abscess without bleeding: Secondary | ICD-10-CM | POA: Diagnosis not present

## 2011-05-20 DIAGNOSIS — C649 Malignant neoplasm of unspecified kidney, except renal pelvis: Secondary | ICD-10-CM | POA: Diagnosis not present

## 2011-06-02 ENCOUNTER — Ambulatory Visit
Admission: RE | Admit: 2011-06-02 | Discharge: 2011-06-02 | Disposition: A | Discharge status: Home / Self Care | Payer: Medicare Other | Source: Ambulatory Visit | Attending: Thoracic Surgery | Admitting: Thoracic Surgery

## 2011-06-02 ENCOUNTER — Ambulatory Visit (INDEPENDENT_AMBULATORY_CARE_PROVIDER_SITE_OTHER): Payer: Medicare Other | Admitting: Thoracic Surgery

## 2011-06-02 ENCOUNTER — Encounter: Payer: Self-pay | Admitting: Thoracic Surgery

## 2011-06-02 VITALS — BP 118/63 | HR 81 | Resp 18 | Ht 64.0 in | Wt 142.0 lb

## 2011-06-02 DIAGNOSIS — D381 Neoplasm of uncertain behavior of trachea, bronchus and lung: Secondary | ICD-10-CM

## 2011-06-02 DIAGNOSIS — Z85528 Personal history of other malignant neoplasm of kidney: Secondary | ICD-10-CM | POA: Diagnosis not present

## 2011-06-02 DIAGNOSIS — R918 Other nonspecific abnormal finding of lung field: Secondary | ICD-10-CM | POA: Diagnosis not present

## 2011-06-02 DIAGNOSIS — Z09 Encounter for follow-up examination after completed treatment for conditions other than malignant neoplasm: Secondary | ICD-10-CM

## 2011-06-02 DIAGNOSIS — J984 Other disorders of lung: Secondary | ICD-10-CM | POA: Diagnosis not present

## 2011-06-02 NOTE — Progress Notes (Signed)
HPI is returns for followup. Of she had multiple pulmonary nodules on her CT scan. He also appeared to be unchanged. However I do not have a final reading. I think we still need to follow this 68 she has a multiple carcinoid syndrome. I informed her that I would be retired in 3 months. She will see one of my partners in 6 months with a chest CT  Current Outpatient Prescriptions  Medication Sig Dispense Refill  . albuterol (PROAIR HFA) 108 (90 BASE) MCG/ACT inhaler Take 2 puffs up to four times daily as needed       . aspirin 81 MG tablet Take 81 mg by mouth daily.        Marland Kitchen atorvastatin (LIPITOR) 40 MG tablet Take 1 tablet (40 mg total) by mouth daily.  90 tablet  3  . buPROPion (WELLBUTRIN SR) 150 MG 12 hr tablet TAKE 1 TABLET BY MOUTH TWICE DAILY  60 tablet  11  . Calcium Carbonate-Vitamin D (CALCIUM-VITAMIN D3 PO) Take 1 tablet by mouth daily.      . Cholecalciferol (VITAMIN D) 1000 UNITS capsule Take 1,000 Units by mouth daily.        . Chromium Picolinate 1000 MCG TABS Take 1 tablet by mouth daily.      . citalopram (CELEXA) 20 MG tablet Take 1 tablet (20 mg total) by mouth daily.  90 tablet  3  . Cyanocobalamin (VITAMIN B 12 PO) Take 1 tablet by mouth daily.      . fexofenadine (ALLEGRA) 180 MG tablet Take 180 mg by mouth daily.        . fluticasone (FLONASE) 50 MCG/ACT nasal spray 2 sprays by Nasal route daily.        Marland Kitchen gabapentin (NEURONTIN) 300 MG capsule Take 2 capsules (600 mg total) by mouth 4 (four) times daily.  480 capsule  3  . Glucosamine-Chondroit-Vit C-Mn (GLUCOSAMINE 1500 COMPLEX PO) Take 1 tablet by mouth daily.        . metformin (FORTAMET) 1000 MG (OSM) 24 hr tablet Take 1 tablet (1,000 mg total) by mouth 2 (two) times daily.  180 tablet  3  . Mometasone Furo-Formoterol Fum (DULERA) 200-5 MCG/ACT AERO Take 2 puffs first thing in am and then another 2 puffs about 12 hours later.  3 Inhaler  4  . Multiple Vitamin (MULTIVITAMIN) tablet Take 1 tablet by mouth daily.        Marland Kitchen  omeprazole (PRILOSEC) 20 MG capsule Take 1 capsule (20 mg total) by mouth daily.  90 capsule  3  . tiotropium (SPIRIVA) 18 MCG inhalation capsule Place 1 capsule (18 mcg total) into inhaler and inhale daily.  90 capsule  3     Review of Systems: Unchanged   Physical Exam lungs are clear attestation percussion CT scan CT scan showed no evidence of change   Diagnostic Tests:   Impression: A renal cell cancer status post resection multiple pulmonary nodules history of multiple carcinoid resected in 1994 at [facility]  Plan: Return in 6 months with CT scan of the chest

## 2011-07-15 ENCOUNTER — Other Ambulatory Visit: Payer: Self-pay | Admitting: Internal Medicine

## 2011-08-10 ENCOUNTER — Encounter: Payer: Self-pay | Admitting: Internal Medicine

## 2011-08-10 ENCOUNTER — Ambulatory Visit (INDEPENDENT_AMBULATORY_CARE_PROVIDER_SITE_OTHER): Payer: Medicare Other | Admitting: Internal Medicine

## 2011-08-10 VITALS — BP 118/60 | HR 74 | Temp 98.2°F | Ht 63.0 in | Wt 141.0 lb

## 2011-08-10 DIAGNOSIS — E1142 Type 2 diabetes mellitus with diabetic polyneuropathy: Secondary | ICD-10-CM

## 2011-08-10 DIAGNOSIS — K219 Gastro-esophageal reflux disease without esophagitis: Secondary | ICD-10-CM

## 2011-08-10 DIAGNOSIS — F329 Major depressive disorder, single episode, unspecified: Secondary | ICD-10-CM | POA: Diagnosis not present

## 2011-08-10 DIAGNOSIS — R7989 Other specified abnormal findings of blood chemistry: Secondary | ICD-10-CM | POA: Diagnosis not present

## 2011-08-10 DIAGNOSIS — R799 Abnormal finding of blood chemistry, unspecified: Secondary | ICD-10-CM

## 2011-08-10 DIAGNOSIS — E785 Hyperlipidemia, unspecified: Secondary | ICD-10-CM

## 2011-08-10 DIAGNOSIS — G3184 Mild cognitive impairment, so stated: Secondary | ICD-10-CM | POA: Diagnosis not present

## 2011-08-10 DIAGNOSIS — E1149 Type 2 diabetes mellitus with other diabetic neurological complication: Secondary | ICD-10-CM

## 2011-08-10 LAB — HEMOGLOBIN A1C: Hgb A1c MFr Bld: 6.6 % — ABNORMAL HIGH (ref 4.6–6.5)

## 2011-08-10 MED ORDER — FLUTICASONE PROPIONATE 50 MCG/ACT NA SUSP: 2.0000 | Freq: Every day | NASAL | Status: DC

## 2011-08-10 MED ORDER — ALBUTEROL SULFATE HFA 108 (90 BASE) MCG/ACT IN AERS: 2.0000 | INHALATION_SPRAY | Freq: Four times a day (QID) | RESPIRATORY_TRACT | Status: DC | PRN

## 2011-08-10 NOTE — Patient Instructions (Addendum)
Please try using the fexofenadine only if your allergies are bad. Stop the glucosamine and chondroitin---only restart if you notice more joint pain when you stop Please try taking only 1/2 of the citalopram pill daily. If your anxiety or depression worsens, go back to a full tab

## 2011-08-10 NOTE — Assessment & Plan Note (Signed)
Seems to still have good control Will check levels 

## 2011-08-10 NOTE — Assessment & Plan Note (Signed)
Mood has been good Will try to wean down citalopram in case that is adding to dry mouth

## 2011-08-10 NOTE — Assessment & Plan Note (Signed)
Lab Results  Component Value Date   LDLCALC 74 03/23/2011   No apparent problems on statin

## 2011-08-10 NOTE — Progress Notes (Signed)
Subjective:    Patient ID: [name], female    DOB: December 18, 1945, 66 y.o.   MRN: [id number]  HPI Here with husband Doing well except for dry mouth Has to drink water regularly On fexofenadine year round---discussed trying to hold Does rinse mouth after using inhalers  Mood has been good No sig anxiety Still on citalopram and bupropion----long standing issue  Ongoing memory problems but no progression No problems with the statin  Checking sugars once a week or so Under 120 No hypoglycemic reactions Due for eye exam soon  No chest pain  No SOB Current Outpatient Prescriptions on File Prior to Visit  Medication Sig Dispense Refill  . aspirin 81 MG tablet Take 81 mg by mouth daily.        Marland Kitchen atorvastatin (LIPITOR) 40 MG tablet Take 1 tablet (40 mg total) by mouth daily.  90 tablet  3  . buPROPion (WELLBUTRIN SR) 150 MG 12 hr tablet TAKE 1 TABLET BY MOUTH TWICE DAILY  60 tablet  11  . Calcium Carbonate-Vitamin D (CALCIUM-VITAMIN D3 PO) Take 1 tablet by mouth daily.      . Cholecalciferol (VITAMIN D) 1000 UNITS capsule Take 1,000 Units by mouth daily.        . Cyanocobalamin (VITAMIN B 12 PO) Take 1 tablet by mouth daily.      . fexofenadine (ALLEGRA) 180 MG tablet Take 180 mg by mouth daily as needed.       . gabapentin (NEURONTIN) 300 MG capsule Take 2 capsules (600 mg total) by mouth 4 (four) times daily.  480 capsule  3  . metformin (FORTAMET) 1000 MG (OSM) 24 hr tablet Take 1 tablet (1,000 mg total) by mouth 2 (two) times daily.  180 tablet  3  . Mometasone Furo-Formoterol Fum (DULERA) 200-5 MCG/ACT AERO Take 2 puffs first thing in am and then another 2 puffs about 12 hours later.  3 Inhaler  4  . Multiple Vitamin (MULTIVITAMIN) tablet Take 1 tablet by mouth daily.        Marland Kitchen omeprazole (PRILOSEC) 20 MG capsule Take 1 capsule (20 mg total) by mouth daily.  90 capsule  3  . tiotropium (SPIRIVA) 18 MCG inhalation capsule Place 1 capsule (18 mcg total) into inhaler and inhale  daily.  90 capsule  3  . DISCONTD: albuterol (PROAIR HFA) 108 (90 BASE) MCG/ACT inhaler Take 2 puffs up to four times daily as needed       . DISCONTD: citalopram (CELEXA) 20 MG tablet Take 1 tablet (20 mg total) by mouth daily.  90 tablet  3  . DISCONTD: fluticasone (FLONASE) 50 MCG/ACT nasal spray USE 2 SPRAYS IN EACH NOSTRIL DAILY FOR ALLERGIES  16 g  0    Allergies  Allergen Reactions  . Budesonide-Formoterol Fumarate Other (See Comments)    REACTION: coughed worse  . Penicillins Rash    REACTION: rash    Past Medical History  Diagnosis Date  . Renal cell carcinoma   . Asthma   . Diabetes mellitus type II   . GERD (gastroesophageal reflux disease)   . Gastroparesis   . Diverticulosis   . Anxiety   . Depression   . History of breast cancer   . History of adenomatous polyp of colon   . Hyperlipidemia   . COPD (chronic obstructive pulmonary disease)   . Pulmonary nodule     multiple  . Osteoarthritis   . Carcinoid tumor of lung 1991  . Mild cognitive impairment  Past Surgical History  Procedure Date  . Cholecystectomy 1975  . Tonsillectomy and adenoidectomy 1953  . Breast lumpectomy 1998    right breast  . Lung surgery 1994    carcinoid removal  . Laparoscopic nephrectomy 01/2009    right    Family History  Problem Relation Age of Onset  . Colon cancer Father   . Lung cancer Father   . Stroke Mother     History   Social History  . Marital Status: Married    Spouse Name: N/A    Number of Children: 2  . Years of Education: N/A   Occupational History  . retired Photographer    Social History Main Topics  . Smoking status: Former Smoker -- 1.0 packs/day for 30 years    Types: Cigarettes    Quit date: 03/08/1993  . Smokeless tobacco: Never Used  . Alcohol Use: No  . Drug Use: No  . Sexually Active: Not on file   Other Topics Concern  . Not on file   Social History Narrative   Restarted with stress in 6/11Daily Caffeine UsePatient does not get  regular exercise.    Review of Systems Sleeping well Appetite is good Bowels are a little slow---using probiotic now (may be helping). Still needs miralax     Objective:   Physical Exam  Constitutional: She appears well-developed and well-nourished. No distress.  Neck: Normal range of motion. Neck supple. No thyromegaly present.  Cardiovascular: Normal rate, regular rhythm, normal heart sounds and intact distal pulses.  Exam reveals no gallop.   No murmur heard. Pulmonary/Chest: Effort normal and breath sounds normal. No respiratory distress. She has no wheezes. She has no rales.  Abdominal: Soft. There is no tenderness.  Musculoskeletal: She exhibits no edema and no tenderness.  Lymphadenopathy:    She has no cervical adenopathy.  Skin: No rash noted. No erythema.       No foot lesions  Psychiatric: She has a normal mood and affect. Her behavior is normal. Thought content normal.          Assessment & Plan:

## 2011-08-10 NOTE — Assessment & Plan Note (Signed)
Stable Felt to be multiple domain and vascular Back on statin. On ASA as well

## 2011-08-10 NOTE — Assessment & Plan Note (Signed)
She is controlled with the prilosec

## 2011-08-11 ENCOUNTER — Encounter: Payer: Self-pay | Admitting: *Deleted

## 2011-08-19 ENCOUNTER — Other Ambulatory Visit: Payer: Self-pay | Admitting: Internal Medicine

## 2011-09-08 ENCOUNTER — Telehealth: Payer: Self-pay

## 2011-09-08 DIAGNOSIS — E1149 Type 2 diabetes mellitus with other diabetic neurological complication: Secondary | ICD-10-CM

## 2011-09-08 NOTE — Telephone Encounter (Signed)
Pt request referral to endocrinologist, Dr Juleen China at [facility]. Pts feet are numb and hurting.Pt will wait to hear from pt care coordinator or CMA.Please advise.

## 2011-09-08 NOTE — Telephone Encounter (Signed)
Referral made 

## 2011-09-14 DIAGNOSIS — M79609 Pain in unspecified limb: Secondary | ICD-10-CM | POA: Diagnosis not present

## 2011-09-14 DIAGNOSIS — R918 Other nonspecific abnormal finding of lung field: Secondary | ICD-10-CM | POA: Diagnosis not present

## 2011-09-14 DIAGNOSIS — R0989 Other specified symptoms and signs involving the circulatory and respiratory systems: Secondary | ICD-10-CM | POA: Diagnosis not present

## 2011-09-14 DIAGNOSIS — J8410 Pulmonary fibrosis, unspecified: Secondary | ICD-10-CM | POA: Diagnosis not present

## 2011-09-14 DIAGNOSIS — E789 Disorder of lipoprotein metabolism, unspecified: Secondary | ICD-10-CM | POA: Diagnosis not present

## 2011-09-14 DIAGNOSIS — R0602 Shortness of breath: Secondary | ICD-10-CM | POA: Diagnosis not present

## 2011-09-14 DIAGNOSIS — G609 Hereditary and idiopathic neuropathy, unspecified: Secondary | ICD-10-CM | POA: Diagnosis not present

## 2011-09-16 DIAGNOSIS — J984 Other disorders of lung: Secondary | ICD-10-CM | POA: Diagnosis not present

## 2011-09-16 DIAGNOSIS — C649 Malignant neoplasm of unspecified kidney, except renal pelvis: Secondary | ICD-10-CM | POA: Diagnosis not present

## 2011-09-20 DIAGNOSIS — E789 Disorder of lipoprotein metabolism, unspecified: Secondary | ICD-10-CM | POA: Diagnosis not present

## 2011-09-20 DIAGNOSIS — M79609 Pain in unspecified limb: Secondary | ICD-10-CM | POA: Diagnosis not present

## 2011-09-27 DIAGNOSIS — G589 Mononeuropathy, unspecified: Secondary | ICD-10-CM | POA: Diagnosis not present

## 2011-09-27 DIAGNOSIS — E789 Disorder of lipoprotein metabolism, unspecified: Secondary | ICD-10-CM | POA: Diagnosis not present

## 2011-09-27 DIAGNOSIS — E119 Type 2 diabetes mellitus without complications: Secondary | ICD-10-CM | POA: Diagnosis not present

## 2011-10-13 ENCOUNTER — Other Ambulatory Visit: Payer: Self-pay | Admitting: Internal Medicine

## 2011-10-13 DIAGNOSIS — Z1231 Encounter for screening mammogram for malignant neoplasm of breast: Secondary | ICD-10-CM

## 2011-10-22 ENCOUNTER — Other Ambulatory Visit: Payer: Self-pay | Admitting: Cardiothoracic Surgery

## 2011-10-22 DIAGNOSIS — R918 Other nonspecific abnormal finding of lung field: Secondary | ICD-10-CM

## 2011-11-16 DIAGNOSIS — E119 Type 2 diabetes mellitus without complications: Secondary | ICD-10-CM | POA: Diagnosis not present

## 2011-11-24 ENCOUNTER — Ambulatory Visit
Admission: RE | Admit: 2011-11-24 | Discharge: 2011-11-24 | Disposition: A | Discharge status: Home / Self Care | Payer: Medicare Other | Source: Ambulatory Visit | Attending: Cardiothoracic Surgery | Admitting: Cardiothoracic Surgery

## 2011-11-24 ENCOUNTER — Ambulatory Visit (INDEPENDENT_AMBULATORY_CARE_PROVIDER_SITE_OTHER): Payer: Medicare Other | Admitting: Cardiothoracic Surgery

## 2011-11-24 VITALS — BP 135/76 | HR 70 | Resp 16 | Ht 63.0 in | Wt 142.0 lb

## 2011-11-24 DIAGNOSIS — R918 Other nonspecific abnormal finding of lung field: Secondary | ICD-10-CM

## 2011-11-24 DIAGNOSIS — Z8709 Personal history of other diseases of the respiratory system: Secondary | ICD-10-CM

## 2011-11-24 DIAGNOSIS — Z87898 Personal history of other specified conditions: Secondary | ICD-10-CM

## 2011-11-24 DIAGNOSIS — E789 Disorder of lipoprotein metabolism, unspecified: Secondary | ICD-10-CM | POA: Diagnosis not present

## 2011-11-24 NOTE — Progress Notes (Signed)
PCP is Tillman Abide, MD Referring Provider is Marcine Matar, MD  Chief Complaint  Patient presents with  . Follow-up    6 month FU  with CT CHEST FOR PULMONARY NODULES    HPI: 66 year old Caucasian female reformed smoker returns for followup with CT scan for review of bilateral pulmonary  nodules followed by Dr. Edwyna Shell since 2011. PET scan at that time showed no metabolic activity in these areas. The patient's history is significant for status post right thoracotomy and resection of 2 carcinoid tumors at [facility]. She's had a right nephrectomy by Dr. Retta Diones for renal cell cancer, early stage. She's also had a previous mastectomy for breast cancer.  Previous CT scans had shown a nodular density in the lingula and in the right lower lobe. Clinically she is done well without evidence of progression in her previous cancers.  Past Medical History  Diagnosis Date  . Renal cell carcinoma   . Asthma   . Diabetes mellitus type II   . GERD (gastroesophageal reflux disease)   . Gastroparesis   . Diverticulosis   . Anxiety   . Depression   . History of breast cancer   . History of adenomatous polyp of colon   . Hyperlipidemia   . COPD (chronic obstructive pulmonary disease)   . Pulmonary nodule     multiple  . Osteoarthritis   . Carcinoid tumor of lung 1991  . Mild cognitive impairment     Past Surgical History  Procedure Date  . Cholecystectomy 1975  . Tonsillectomy and adenoidectomy 1953  . Breast lumpectomy 1998    right breast  . Lung surgery 1994    carcinoid removal  . Laparoscopic nephrectomy 01/2009    right    Family History  Problem Relation Age of Onset  . Colon cancer Father   . Lung cancer Father   . Stroke Mother     Social History History  Substance Use Topics  . Smoking status: Former Smoker -- 1.0 packs/day for 30 years    Types: Cigarettes    Quit date: 03/08/1993  . Smokeless tobacco: Never Used  . Alcohol Use: No    Current Outpatient  Prescriptions  Medication Sig Dispense Refill  . albuterol (PROAIR HFA) 108 (90 BASE) MCG/ACT inhaler Inhale 2 puffs into the lungs 4 (four) times daily as needed.  26 g  3  . atorvastatin (LIPITOR) 40 MG tablet Take 1 tablet (40 mg total) by mouth daily.  90 tablet  3  . Calcium Carbonate-Vitamin D (CALCIUM-VITAMIN D3 PO) Take 1 tablet by mouth daily.      . Cholecalciferol (VITAMIN D) 1000 UNITS capsule Take 1,000 Units by mouth daily.        . citalopram (CELEXA) 20 MG tablet Take 10 mg by mouth daily.      . fexofenadine (ALLEGRA) 180 MG tablet Take 180 mg by mouth daily as needed.       . fluticasone (FLONASE) 50 MCG/ACT nasal spray Place 2 sprays into the nose daily.  48 g  3  . metformin (FORTAMET) 1000 MG (OSM) 24 hr tablet Take 1 tablet (1,000 mg total) by mouth 2 (two) times daily.  180 tablet  3  . Mometasone Furo-Formoterol Fum (DULERA) 200-5 MCG/ACT AERO Take 2 puffs first thing in am and then another 2 puffs about 12 hours later.  3 Inhaler  4  . Multiple Vitamin (MULTIVITAMIN) tablet Take 1 tablet by mouth daily.        Marland Kitchen  omeprazole (PRILOSEC) 20 MG capsule Take 1 capsule (20 mg total) by mouth daily.  90 capsule  3  . tiotropium (SPIRIVA) 18 MCG inhalation capsule Place 1 capsule (18 mcg total) into inhaler and inhale daily.  90 capsule  3  . aspirin 81 MG tablet Take 81 mg by mouth daily.        Marland Kitchen buPROPion (WELLBUTRIN SR) 150 MG 12 hr tablet TAKE 1 TABLET BY MOUTH TWICE DAILY  60 tablet  11  . Cyanocobalamin (VITAMIN B 12 PO) Take 1 tablet by mouth daily.      . fluticasone (FLONASE) 50 MCG/ACT nasal spray INSTILL 2 SPRAYS IN EACH NOSTRIL EVERY DAY FOR ALLERGIES  16 g  11  . gabapentin (NEURONTIN) 300 MG capsule Take 2 capsules (600 mg total) by mouth 4 (four) times daily.  480 capsule  3  . L-Methylfolate-Algae-B12-B6 (METANX) 3-90.314-2-35 MG CAPS Take 180 mg by mouth 2 (two) times daily.      Marland Kitchen LYRICA 75 MG capsule Take 75 mg by mouth 2 (two) times daily.        Allergies    Allergen Reactions  . Budesonide-Formoterol Fumarate Other (See Comments)    REACTION: coughed worse  . Penicillins Rash    REACTION: rash    Review of Systems no fever, no weight loss, no hemoptysis, no chest wall pain no night sweats  BP 135/76  Pulse 70  Resp 16  Ht 5\' 3"  (1.6 m)  Wt 142 lb (64.411 kg)  BMI 25.15 kg/m2  SpO2 93% Physical Exam Alert and comfortable Breath sounds clear and equal  Cardiac rhythm regular No palpable nodes in the neck  Diagnostic Tests:CT. scan shows no significant changes in the 2 nodules. They have been stable for almost 3 years. I do not feel that a repeat PET scan is indicated. We will continue to follow her with a annual CT scan of the chest as Dr. Edwyna Shell indicated there is some risk of recurrent multiple carcinoid tumors. However the 2 areas in question have not shown appreciable increase in size over a period years.  Impression: Persistent bilateral pulmonary nodules, stable Plan: CT scan chest in one year, surveillance

## 2011-11-30 DIAGNOSIS — E789 Disorder of lipoprotein metabolism, unspecified: Secondary | ICD-10-CM | POA: Diagnosis not present

## 2011-11-30 DIAGNOSIS — G589 Mononeuropathy, unspecified: Secondary | ICD-10-CM | POA: Diagnosis not present

## 2011-11-30 DIAGNOSIS — E119 Type 2 diabetes mellitus without complications: Secondary | ICD-10-CM | POA: Diagnosis not present

## 2011-12-22 ENCOUNTER — Ambulatory Visit: Payer: Medicare Other

## 2011-12-31 ENCOUNTER — Ambulatory Visit
Admission: RE | Admit: 2011-12-31 | Discharge: 2011-12-31 | Disposition: A | Discharge status: Home / Self Care | Payer: Medicare Other | Source: Ambulatory Visit | Attending: Internal Medicine | Admitting: Internal Medicine

## 2011-12-31 DIAGNOSIS — Z1231 Encounter for screening mammogram for malignant neoplasm of breast: Secondary | ICD-10-CM

## 2012-01-05 ENCOUNTER — Encounter: Payer: Self-pay | Admitting: *Deleted

## 2012-01-31 DIAGNOSIS — E119 Type 2 diabetes mellitus without complications: Secondary | ICD-10-CM | POA: Diagnosis not present

## 2012-01-31 DIAGNOSIS — G589 Mononeuropathy, unspecified: Secondary | ICD-10-CM | POA: Diagnosis not present

## 2012-02-24 ENCOUNTER — Encounter: Payer: Self-pay | Admitting: Internal Medicine

## 2012-02-24 ENCOUNTER — Ambulatory Visit (INDEPENDENT_AMBULATORY_CARE_PROVIDER_SITE_OTHER): Payer: Medicare Other | Admitting: Internal Medicine

## 2012-02-24 VITALS — BP 128/68 | HR 84 | Temp 98.3°F | Ht 63.0 in | Wt 141.0 lb

## 2012-02-24 DIAGNOSIS — J449 Chronic obstructive pulmonary disease, unspecified: Secondary | ICD-10-CM

## 2012-02-24 DIAGNOSIS — E1142 Type 2 diabetes mellitus with diabetic polyneuropathy: Secondary | ICD-10-CM

## 2012-02-24 DIAGNOSIS — G3184 Mild cognitive impairment, so stated: Secondary | ICD-10-CM

## 2012-02-24 DIAGNOSIS — Z1331 Encounter for screening for depression: Secondary | ICD-10-CM

## 2012-02-24 DIAGNOSIS — E1149 Type 2 diabetes mellitus with other diabetic neurological complication: Secondary | ICD-10-CM

## 2012-02-24 DIAGNOSIS — E785 Hyperlipidemia, unspecified: Secondary | ICD-10-CM

## 2012-02-24 DIAGNOSIS — F411 Generalized anxiety disorder: Secondary | ICD-10-CM

## 2012-02-24 DIAGNOSIS — H6120 Impacted cerumen, unspecified ear: Secondary | ICD-10-CM

## 2012-02-24 DIAGNOSIS — J4489 Other specified chronic obstructive pulmonary disease: Secondary | ICD-10-CM

## 2012-02-24 MED ORDER — TIOTROPIUM BROMIDE MONOHYDRATE 18 MCG IN CAPS: 18.0000 ug | ORAL_CAPSULE | Freq: Every day | RESPIRATORY_TRACT | Status: DC

## 2012-02-24 MED ORDER — MOMETASONE FURO-FORMOTEROL FUM 200-5 MCG/ACT IN AERO: INHALATION_SPRAY | RESPIRATORY_TRACT | Status: DC

## 2012-02-24 MED ORDER — ATORVASTATIN CALCIUM 40 MG PO TABS: 40.0000 mg | ORAL_TABLET | Freq: Every day | ORAL | Status: DC

## 2012-02-24 MED ORDER — CITALOPRAM HYDROBROMIDE 20 MG PO TABS: 10.0000 mg | ORAL_TABLET | Freq: Every day | ORAL | Status: DC

## 2012-02-24 MED ORDER — OMEPRAZOLE 20 MG PO CPDR: 20.0000 mg | DELAYED_RELEASE_CAPSULE | Freq: Every day | ORAL | Status: DC

## 2012-02-24 MED ORDER — METFORMIN HCL ER (OSM) 1000 MG PO TB24: 1000.0000 mg | ORAL_TABLET | Freq: Two times a day (BID) | ORAL | Status: DC

## 2012-02-24 NOTE — Patient Instructions (Addendum)
Please call Dr Jenne Campus for an appointment if you are not better after you clean the wax out of your ears

## 2012-02-24 NOTE — Progress Notes (Signed)
Subjective:    Patient ID: [name], female    DOB: [birth year], 66 y.o.   MRN: [id number]  HPI Here with husband  Having trouble with sinuses Having right ear pain and decreased hearing Going on for 2 -3 weeks Tried allegra and tylenol sinus ----not helping. ON flonase Doesn't feel sick Head is full No fever No sig sinus drainage Some cough--not a lot Gas heat--doesn't seem dry Has seen Dr Jenne Campus in the past  Fell 2.5 weeks ago-- stubbed toe coming in kitchen door and fell hard Has knot on right hip and bruised on entire right leg Larey Seat again the next day  Memory is still poor Husband doesn't note any decline  Checks sugars once a week--usually "okay" Generally close to 100 No hypoglycemic reactions  Breathing is good Same inhalers  Current Outpatient Prescriptions on File Prior to Visit  Medication Sig Dispense Refill  . albuterol (PROAIR HFA) 108 (90 BASE) MCG/ACT inhaler Inhale 2 puffs into the lungs 4 (four) times daily as needed.  26 g  3  . aspirin 81 MG tablet Take 81 mg by mouth daily.        Marland Kitchen atorvastatin (LIPITOR) 40 MG tablet Take 1 tablet (40 mg total) by mouth daily.  90 tablet  3  . buPROPion (WELLBUTRIN SR) 150 MG 12 hr tablet TAKE 1 TABLET BY MOUTH TWICE DAILY  60 tablet  11  . Calcium Carbonate-Vitamin D (CALCIUM-VITAMIN D3 PO) Take 1 tablet by mouth daily.      . Cholecalciferol (VITAMIN D) 1000 UNITS capsule Take 1,000 Units by mouth daily.        . citalopram (CELEXA) 20 MG tablet Take 0.5 tablets (10 mg total) by mouth daily.  45 tablet  3  . fluticasone (FLONASE) 50 MCG/ACT nasal spray INSTILL 2 SPRAYS IN EACH NOSTRIL EVERY DAY FOR ALLERGIES  16 g  11  . gabapentin (NEURONTIN) 300 MG capsule Take 2 capsules (600 mg total) by mouth 4 (four) times daily.  480 capsule  3  . L-Methylfolate-Algae-B12-B6 (METANX) 3-90.314-2-35 MG CAPS Take 180 mg by mouth 2 (two) times daily.      Marland Kitchen LYRICA 75 MG capsule Take 75 mg by mouth 2 (two) times daily.       . metformin (FORTAMET) 1000 MG (OSM) 24 hr tablet Take 1 tablet (1,000 mg total) by mouth 2 (two) times daily.  180 tablet  3  . mometasone-formoterol (DULERA) 200-5 MCG/ACT AERO Take 2 puffs first thing in am and then another 2 puffs about 12 hours later.  26.4 g  3  . Multiple Vitamin (MULTIVITAMIN) tablet Take 1 tablet by mouth daily.        Marland Kitchen omeprazole (PRILOSEC) 20 MG capsule Take 1 capsule (20 mg total) by mouth daily.  90 capsule  3  . tiotropium (SPIRIVA) 18 MCG inhalation capsule Place 1 capsule (18 mcg total) into inhaler and inhale daily.  90 capsule  3    Allergies  Allergen Reactions  . Budesonide-Formoterol Fumarate Other (See Comments)    REACTION: coughed worse  . Penicillins Rash    REACTION: rash    Past Medical History  Diagnosis Date  . Renal cell carcinoma   . Asthma   . Diabetes mellitus type II   . GERD (gastroesophageal reflux disease)   . Gastroparesis   . Diverticulosis   . Anxiety   . Depression   . History of breast cancer   . History of adenomatous polyp of colon   .  Hyperlipidemia   . COPD (chronic obstructive pulmonary disease)   . Pulmonary nodule     multiple  . Osteoarthritis   . Carcinoid tumor of lung 1991  . Mild cognitive impairment     Past Surgical History  Procedure Date  . Cholecystectomy 1975  . Tonsillectomy and adenoidectomy 1953  . Breast lumpectomy 1998    right breast  . Lung surgery 1994    carcinoid removal  . Laparoscopic nephrectomy 01/2009    right    Family History  Problem Relation Age of Onset  . Colon cancer Father   . Lung cancer Father   . Stroke Mother     History   Social History  . Marital Status: Married    Spouse Name: N/A    Number of Children: 2  . Years of Education: N/A   Occupational History  . retired Photographer    Social History Main Topics  . Smoking status: Former Smoker -- 1.0 packs/day for 30 years    Types: Cigarettes    Quit date: 03/08/1993  . Smokeless tobacco: Never  Used  . Alcohol Use: No  . Drug Use: No  . Sexually Active: Not on file   Other Topics Concern  . Not on file   Social History Narrative   Restarted with stress in 6/11Daily Caffeine UsePatient does not get regular exercise.     Review of Systems Appetite is good Weight stable Sleeps okay     Objective:   Physical Exam  Constitutional: She appears well-developed and well-nourished. No distress.  HENT:  Mouth/Throat: No oropharyngeal exudate.       No sinus tenderness TMs occluded by cerumen Mild nasal swelling  Neck: Normal range of motion. Neck supple. No thyromegaly present.  Cardiovascular: Normal rate, regular rhythm, normal heart sounds and intact distal pulses.  Exam reveals no gallop.   No murmur heard.      Faint pedal pulses  Pulmonary/Chest: Effort normal and breath sounds normal. No respiratory distress. She has no wheezes. She has no rales.  Musculoskeletal: She exhibits no edema.       Large hematoma on right hip laterally --at top of thigh  Lymphadenopathy:    She has no cervical adenopathy.  Skin: No rash noted. No erythema.       No foot lesions  Psychiatric: She has a normal mood and affect. Her behavior is normal.          Assessment & Plan:

## 2012-02-24 NOTE — Assessment & Plan Note (Signed)
No change in mild problems Global deficits Probably vascular

## 2012-02-24 NOTE — Assessment & Plan Note (Addendum)
Feels the lyrica does a good job

## 2012-02-24 NOTE — Assessment & Plan Note (Signed)
Controlled on her meds No change

## 2012-02-24 NOTE — Assessment & Plan Note (Signed)
Has been well controlled Will check labs

## 2012-02-24 NOTE — Assessment & Plan Note (Signed)
Due for labs

## 2012-02-24 NOTE — Assessment & Plan Note (Signed)
Stable respiratory status 

## 2012-02-24 NOTE — Assessment & Plan Note (Signed)
Not sure if this is causing her symptoms They will try to clean and will go to Dr Jenne Campus if symptoms persist

## 2012-02-25 DIAGNOSIS — J301 Allergic rhinitis due to pollen: Secondary | ICD-10-CM | POA: Diagnosis not present

## 2012-02-25 DIAGNOSIS — H6120 Impacted cerumen, unspecified ear: Secondary | ICD-10-CM | POA: Diagnosis not present

## 2012-02-25 DIAGNOSIS — H903 Sensorineural hearing loss, bilateral: Secondary | ICD-10-CM | POA: Diagnosis not present

## 2012-02-25 DIAGNOSIS — J0100 Acute maxillary sinusitis, unspecified: Secondary | ICD-10-CM | POA: Diagnosis not present

## 2012-02-25 LAB — HEPATIC FUNCTION PANEL
Alkaline Phosphatase: 53 U/L (ref 39–117)
Bilirubin, Direct: 0.1 mg/dL (ref 0.0–0.3)
Total Protein: 6.6 g/dL (ref 6.0–8.3)

## 2012-02-25 LAB — BASIC METABOLIC PANEL
BUN: 18 mg/dL (ref 6–23)
CO2: 28 mEq/L (ref 19–32)
Calcium: 9.1 mg/dL (ref 8.4–10.5)
Creatinine, Ser: 1.2 mg/dL (ref 0.4–1.2)
GFR: 46.07 mL/min — ABNORMAL LOW (ref >60.00)
Glucose, Bld: 166 mg/dL — ABNORMAL HIGH (ref 70–99)
Sodium: 140 mEq/L (ref 135–145)

## 2012-02-25 LAB — LIPID PANEL
HDL: 52.3 mg/dL (ref >39.00)
LDL Cholesterol: 55 mg/dL (ref 0–99)
Total CHOL/HDL Ratio: 3
Triglycerides: 156 mg/dL — ABNORMAL HIGH (ref 0.0–149.0)

## 2012-02-25 LAB — TSH: TSH: 0.51 u[IU]/mL (ref 0.35–5.50)

## 2012-02-25 LAB — CBC WITH DIFFERENTIAL/PLATELET
Basophils Absolute: 0 10*3/uL (ref 0.0–0.1)
Eosinophils Absolute: 0.1 10*3/uL (ref 0.0–0.7)
Lymphocytes Relative: 28.1 % (ref 12.0–46.0)
MCHC: 32.8 g/dL (ref 30.0–36.0)
Monocytes Relative: 6 % (ref 3.0–12.0)
Neutro Abs: 5.3 10*3/uL (ref 1.4–7.7)
Neutrophils Relative %: 64 % (ref 43.0–77.0)
Platelets: 233 10*3/uL (ref 150.0–400.0)
RDW: 14.5 % (ref 11.5–14.6)

## 2012-02-25 LAB — MICROALBUMIN / CREATININE URINE RATIO: Microalb, Ur: 0.2 mg/dL (ref 0.0–1.9)

## 2012-02-28 ENCOUNTER — Encounter: Payer: Self-pay | Admitting: *Deleted

## 2012-03-15 DIAGNOSIS — J0100 Acute maxillary sinusitis, unspecified: Secondary | ICD-10-CM | POA: Diagnosis not present

## 2012-03-15 DIAGNOSIS — H6980 Other specified disorders of Eustachian tube, unspecified ear: Secondary | ICD-10-CM | POA: Diagnosis not present

## 2012-03-23 ENCOUNTER — Ambulatory Visit: Payer: Medicare Other | Admitting: Internal Medicine

## 2012-04-03 DIAGNOSIS — E119 Type 2 diabetes mellitus without complications: Secondary | ICD-10-CM | POA: Diagnosis not present

## 2012-04-07 DIAGNOSIS — E119 Type 2 diabetes mellitus without complications: Secondary | ICD-10-CM | POA: Diagnosis not present

## 2012-04-07 DIAGNOSIS — G589 Mononeuropathy, unspecified: Secondary | ICD-10-CM | POA: Diagnosis not present

## 2012-04-17 DIAGNOSIS — J209 Acute bronchitis, unspecified: Secondary | ICD-10-CM | POA: Diagnosis not present

## 2012-04-17 DIAGNOSIS — H1030 Unspecified acute conjunctivitis, unspecified eye: Secondary | ICD-10-CM | POA: Diagnosis not present

## 2012-04-17 DIAGNOSIS — H60509 Unspecified acute noninfective otitis externa, unspecified ear: Secondary | ICD-10-CM | POA: Diagnosis not present

## 2012-04-24 DIAGNOSIS — J0100 Acute maxillary sinusitis, unspecified: Secondary | ICD-10-CM | POA: Diagnosis not present

## 2012-04-24 DIAGNOSIS — H6980 Other specified disorders of Eustachian tube, unspecified ear: Secondary | ICD-10-CM | POA: Diagnosis not present

## 2012-04-28 DIAGNOSIS — H04129 Dry eye syndrome of unspecified lacrimal gland: Secondary | ICD-10-CM | POA: Diagnosis not present

## 2012-05-03 ENCOUNTER — Other Ambulatory Visit: Payer: Self-pay | Admitting: Internal Medicine

## 2012-05-03 ENCOUNTER — Other Ambulatory Visit: Payer: Self-pay

## 2012-05-03 MED ORDER — CITALOPRAM HYDROBROMIDE 20 MG PO TABS: 20.0000 mg | ORAL_TABLET | Freq: Every day | ORAL | Status: DC

## 2012-05-03 MED ORDER — MOMETASONE FURO-FORMOTEROL FUM 200-5 MCG/ACT IN AERO: INHALATION_SPRAY | RESPIRATORY_TRACT | Status: DC

## 2012-05-03 MED ORDER — ATORVASTATIN CALCIUM 40 MG PO TABS: 40.0000 mg | ORAL_TABLET | Freq: Every day | ORAL | Status: DC

## 2012-05-03 NOTE — Telephone Encounter (Signed)
Confirm dose and send Rx for a year

## 2012-05-03 NOTE — Telephone Encounter (Signed)
rx sent to pharmacy by e-script  

## 2012-05-03 NOTE — Telephone Encounter (Signed)
Pt request refill Atorvastatin (DONE) and refill citalopram 20 mg taking one daily to [facility]. On pt's med list has pt taking Citalopram 20 mg taking 1/2 tab daily.Please advise. Prescription refills for Atorvastatin and Citalopram cancelled at [facility].

## 2012-05-03 NOTE — Telephone Encounter (Signed)
Pt said getting the 13 g size inhaler instead of 8.8 so pt request 3 inhalers of 13 g size sent to [facility].pt notified done.

## 2012-05-04 ENCOUNTER — Ambulatory Visit: Payer: Medicare Other | Admitting: Internal Medicine

## 2012-05-05 ENCOUNTER — Ambulatory Visit: Payer: Medicare Other | Admitting: Internal Medicine

## 2012-05-26 ENCOUNTER — Ambulatory Visit: Payer: Medicare Other | Admitting: Internal Medicine

## 2012-06-09 ENCOUNTER — Ambulatory Visit: Payer: Medicare Other | Admitting: Internal Medicine

## 2012-06-20 ENCOUNTER — Ambulatory Visit: Payer: Medicare Other | Admitting: Internal Medicine

## 2012-06-30 ENCOUNTER — Ambulatory Visit: Payer: Medicare Other | Admitting: Internal Medicine

## 2012-07-06 ENCOUNTER — Ambulatory Visit: Payer: Medicare Other | Admitting: Internal Medicine

## 2012-07-26 ENCOUNTER — Encounter: Payer: Self-pay | Admitting: Internal Medicine

## 2012-07-26 ENCOUNTER — Ambulatory Visit (INDEPENDENT_AMBULATORY_CARE_PROVIDER_SITE_OTHER)
Admission: RE | Admit: 2012-07-26 | Discharge: 2012-07-26 | Disposition: A | Discharge status: Home / Self Care | Payer: Medicare Other | Source: Ambulatory Visit | Attending: Internal Medicine | Admitting: Internal Medicine

## 2012-07-26 ENCOUNTER — Ambulatory Visit (INDEPENDENT_AMBULATORY_CARE_PROVIDER_SITE_OTHER): Payer: Medicare Other | Admitting: Internal Medicine

## 2012-07-26 VITALS — BP 104/60 | HR 77 | Temp 97.7°F | Ht 62.5 in | Wt 137.6 lb

## 2012-07-26 DIAGNOSIS — J9819 Other pulmonary collapse: Secondary | ICD-10-CM | POA: Diagnosis not present

## 2012-07-26 DIAGNOSIS — J984 Other disorders of lung: Secondary | ICD-10-CM | POA: Diagnosis not present

## 2012-07-26 DIAGNOSIS — J449 Chronic obstructive pulmonary disease, unspecified: Secondary | ICD-10-CM

## 2012-07-26 DIAGNOSIS — J4489 Other specified chronic obstructive pulmonary disease: Secondary | ICD-10-CM

## 2012-07-26 NOTE — Progress Notes (Signed)
Subjective:     Patient ID: [name], female   DOB: 1945/09/22    MRN: [id number]    Brief patient profile:  30 yowf quit smoking 1999 got better then since then off and on cough and sob maybe worse in fall and sometimes worse while sleeping not typically in am's dx with GOLD III copd 04/2005  January 03, 2008 first pulmonary eval for cough minimally productive, not necessarily exacerbating in am or winter. doe > slow adl's. rec  stop  advair and ramapril > better   May 20, 2008 ov no limiting dyspea, minimal cough, variable sob much better after proaire. rec try off spiriva, seemed worse so restarted and occ need proaire.   01/01/2011 f/u ov/[name] cc doe x 2 weeks.  Baseline = ok walking slow flat and one flight of steps RA , indolent onset doe ok at rest  X 2 weeks and feeling a little tight with congested cough but no lateralizing or localized cp. Takes prilosec p meals, proaire not needing before but now helps significantly and uses several times a day with very poor technique.  No purulent sputum. 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  Only use your albuterol(proaire, your red inhaler)  as a rescue medication   Prednisone 10 mg take  4 each am x 2 days,   2 each am x 2 days,  1 each am x2days and stop  Prilosec Take 30-60 min before first meal of the day  GERD diet   03/18/2011 f/u ov/[name] cc  No change doe, cough almost resolved, really thinks dulera helping rec No change rx  07/26/2012 f/u ov/[name] re GOLD III COPD on dulera and spiriva  Chief Complaint  Patient presents with  . Follow-up    Pt states breathing is doing well, no changes since the last visit. Denies any new co's.    rare saba, not limited by breathing but not doing many inclines  No obvious daytime variabilty or assoc chronic cough or cp or chest tightness, subjective wheeze overt sinus or hb symptoms. No unusual exp hx  or h/o childhood pna/ asthma or premature birth to her knowledge.   Sleeping ok without nocturnal  or early am exacerbation  of respiratory  c/o's or need for noct saba. Also denies any obvious fluctuation of symptoms with weather or environmental changes or other aggravating or alleviating factors except as outlined above   Current Medications, Allergies, Past Medical History, Past Surgical History, Family History, and Social History were reviewed in [employer] record.  ROS  The following are not active complaints unless bolded sore throat, dysphagia, dental problems, itching, sneezing,  nasal congestion or excess/ purulent secretions, ear ache,   fever, chills, sweats, unintended wt loss, pleuritic or exertional cp, hemoptysis,  orthopnea pnd or leg swelling, presyncope, palpitations, heartburn, abdominal pain, anorexia, nausea, vomiting, diarrhea  or change in bowel or urinary habits, change in stools or urine, dysuria,hematuria,  rash, arthralgias, visual complaints, headache, numbness weakness or ataxia or problems with walking or coordination,  change in mood/affect or memory.           Past Medical History:  Allergic rhinitis  Asthma  Diabetes mellitus, type II with neuropathy  GERD  HX GASTROPARESIS  DIVERTICULOSIS  Anxiety  Depression  Breast cancer, hx of  Colonic polyps, hx of/ADENOMATOUS 2007  Hyperlipidemia  COPD  -  PFT's 2/07 FEV1 .79  - PFT's 02/15/08 FEV1 .97 (45%) ratio 49, 11% better after Beta 2, DLCO 48%  Multiple Pulmonary Nodules  - CT 12/24/08 assoc with R renal mass   Past Surgical History:  Cholecystectomy--1975  Tonsillectomy/A--1953 adenoidectomy  Lumpectomy--right breast 1998 no positive nodes RT only  Lung carcinoid removed (2)--1994 [facility]        Objective:   Physical Exam  amb wf nad   wt   146 02/15/08 >152 May 20, 2008  > 01/01/2011  142 > 142 01/15/2011 > 03/18/2011  141> 138 07/26/2012  HEENT L face ecchymosis mild  turbinate edema. Oropharynx no thrush or excess pnd or cobblestoning. No JVD or cervical adenopathy. Mild accessory muscle hypertrophy. Trachea midline, nl thryroid. Chest was hyperinflated by percussion with diminished breath sounds and moderate increased exp time without wheeze. Hoover sign positive at mid inspiration. Regular rate and rhythm without murmur gallop or rub or increase P2. No edema Abd: no hsm, nl excursion. Ext warm without cyanosis or clubbing      CXR  07/26/2012 :   1. There is new consolidation and atelectasis involving the right  middle lobe. Advise follow-up imaging to ensure resolution. If  this does not resolve then CT of the chest should be obtained to  evaluate for underlying mass lesion.  2. Chronic nodular interstitial abnormality compatible with  chronic atypical indolent infection such as MAI.   Assessment:      Plan:

## 2012-07-26 NOTE — Patient Instructions (Addendum)
Ok to try off spiriva to see if it makes any difference in your activity tolerance (think of it like high octane for your lungs)   Please remember to go to the x-ray department downstairs for your tests - we will call you with the results when they are available.    You will need yearly follow up, here or in [place] office Late add: f/u 3 months due to new change on cxr

## 2012-07-27 ENCOUNTER — Other Ambulatory Visit: Payer: Self-pay | Admitting: *Deleted

## 2012-07-27 NOTE — Assessment & Plan Note (Signed)
-   CT Chest 10/27/10 Diffuse peribronchovascular nodularity with minimal  bronchiectasis, unchanged from baseline examination of 07/18/2009.  Differential diagnosis includes Mycobacterium avium complex (MAC)  and sarcoid.  2. Pulmonary arterial hypertension - associated with RML subsegmental atx 07/26/12 >  This is Not unexpected in the setting of low grade bronchiectasis > no change in rx but will more f/u to 3 months with cxr

## 2012-07-27 NOTE — Assessment & Plan Note (Signed)
-   PFT's 2/07 FEV1 .79  - PFT's 02/15/08 FEV1 .97 (45%) ratio 49, 11% better after Beta 2, DLCO 48% - PFT's 03/18/2011  FEV1 0.97 (47%) ratio 52 and no change p B2,  DLCO 37 corrects to 72% - HFA 90% 07/26/2012  - 01/15/2011  Walked RA x 1 laps @ 185 ft each stopped due to  desat to 88 p 1 lap> declined 02  Moderately severe with low dlco and desats with activity but relatively well compensated on dulera 200 2bid > no change rx    Each maintenance medication was reviewed in detail including most importantly the difference between maintenance and as needed and under what circumstances the prns are to be used.  Please see instructions for details which were reviewed in writing and the patient given a copy.

## 2012-07-28 DIAGNOSIS — E789 Disorder of lipoprotein metabolism, unspecified: Secondary | ICD-10-CM | POA: Diagnosis not present

## 2012-07-28 DIAGNOSIS — E119 Type 2 diabetes mellitus without complications: Secondary | ICD-10-CM | POA: Diagnosis not present

## 2012-08-03 ENCOUNTER — Telehealth: Payer: Self-pay | Admitting: Internal Medicine

## 2012-08-03 DIAGNOSIS — IMO0001 Reserved for inherently not codable concepts without codable children: Secondary | ICD-10-CM | POA: Diagnosis not present

## 2012-08-03 DIAGNOSIS — M5137 Other intervertebral disc degeneration, lumbosacral region: Secondary | ICD-10-CM | POA: Diagnosis not present

## 2012-08-03 DIAGNOSIS — M5380 Other specified dorsopathies, site unspecified: Secondary | ICD-10-CM | POA: Diagnosis not present

## 2012-08-03 DIAGNOSIS — M999 Biomechanical lesion, unspecified: Secondary | ICD-10-CM | POA: Diagnosis not present

## 2012-08-03 DIAGNOSIS — M549 Dorsalgia, unspecified: Secondary | ICD-10-CM | POA: Diagnosis not present

## 2012-08-03 MED ORDER — ALBUTEROL SULFATE HFA 108 (90 BASE) MCG/ACT IN AERS: 2.0000 | INHALATION_SPRAY | Freq: Four times a day (QID) | RESPIRATORY_TRACT | Status: DC | PRN

## 2012-08-03 NOTE — Telephone Encounter (Signed)
rx sent to pharmacy by e-script  

## 2012-08-03 NOTE — Telephone Encounter (Signed)
Pt called back; gave wrong pharmacy to [facility]; pt request ProAir refill to go to [facility]. Pt going out of town in [place] and request refill done today.

## 2012-08-03 NOTE — Telephone Encounter (Signed)
Caller: Lyrick/Patient; Phone: [phone]; Reason for Call: Pt requesting a refill on her ProAir Inhaler.  Pt request generic and [facility] [address], [place].  PLEASE F/U WITH PT WITH QUESTIONS, THANK YOU.  Marland Kitchen

## 2012-08-04 DIAGNOSIS — M999 Biomechanical lesion, unspecified: Secondary | ICD-10-CM | POA: Diagnosis not present

## 2012-08-04 DIAGNOSIS — S199XXA Unspecified injury of neck, initial encounter: Secondary | ICD-10-CM | POA: Diagnosis not present

## 2012-08-04 DIAGNOSIS — M5137 Other intervertebral disc degeneration, lumbosacral region: Secondary | ICD-10-CM | POA: Diagnosis not present

## 2012-08-04 DIAGNOSIS — M5380 Other specified dorsopathies, site unspecified: Secondary | ICD-10-CM | POA: Diagnosis not present

## 2012-08-04 DIAGNOSIS — S0003XA Contusion of scalp, initial encounter: Secondary | ICD-10-CM | POA: Diagnosis not present

## 2012-08-04 DIAGNOSIS — M549 Dorsalgia, unspecified: Secondary | ICD-10-CM | POA: Diagnosis not present

## 2012-08-04 DIAGNOSIS — S0993XA Unspecified injury of face, initial encounter: Secondary | ICD-10-CM | POA: Diagnosis not present

## 2012-08-04 DIAGNOSIS — IMO0001 Reserved for inherently not codable concepts without codable children: Secondary | ICD-10-CM | POA: Diagnosis not present

## 2012-08-10 DIAGNOSIS — M5137 Other intervertebral disc degeneration, lumbosacral region: Secondary | ICD-10-CM | POA: Diagnosis not present

## 2012-08-10 DIAGNOSIS — M999 Biomechanical lesion, unspecified: Secondary | ICD-10-CM | POA: Diagnosis not present

## 2012-08-10 DIAGNOSIS — M549 Dorsalgia, unspecified: Secondary | ICD-10-CM | POA: Diagnosis not present

## 2012-08-10 DIAGNOSIS — IMO0001 Reserved for inherently not codable concepts without codable children: Secondary | ICD-10-CM | POA: Diagnosis not present

## 2012-08-10 DIAGNOSIS — M5380 Other specified dorsopathies, site unspecified: Secondary | ICD-10-CM | POA: Diagnosis not present

## 2012-08-11 DIAGNOSIS — IMO0001 Reserved for inherently not codable concepts without codable children: Secondary | ICD-10-CM | POA: Diagnosis not present

## 2012-08-11 DIAGNOSIS — M999 Biomechanical lesion, unspecified: Secondary | ICD-10-CM | POA: Diagnosis not present

## 2012-08-11 DIAGNOSIS — M549 Dorsalgia, unspecified: Secondary | ICD-10-CM | POA: Diagnosis not present

## 2012-08-11 DIAGNOSIS — M5380 Other specified dorsopathies, site unspecified: Secondary | ICD-10-CM | POA: Diagnosis not present

## 2012-08-11 DIAGNOSIS — M5137 Other intervertebral disc degeneration, lumbosacral region: Secondary | ICD-10-CM | POA: Diagnosis not present

## 2012-08-14 DIAGNOSIS — IMO0001 Reserved for inherently not codable concepts without codable children: Secondary | ICD-10-CM | POA: Diagnosis not present

## 2012-08-14 DIAGNOSIS — M5380 Other specified dorsopathies, site unspecified: Secondary | ICD-10-CM | POA: Diagnosis not present

## 2012-08-14 DIAGNOSIS — M549 Dorsalgia, unspecified: Secondary | ICD-10-CM | POA: Diagnosis not present

## 2012-08-14 DIAGNOSIS — I891 Lymphangitis: Secondary | ICD-10-CM | POA: Diagnosis not present

## 2012-08-14 DIAGNOSIS — M5137 Other intervertebral disc degeneration, lumbosacral region: Secondary | ICD-10-CM | POA: Diagnosis not present

## 2012-08-14 DIAGNOSIS — M999 Biomechanical lesion, unspecified: Secondary | ICD-10-CM | POA: Diagnosis not present

## 2012-08-15 DIAGNOSIS — M999 Biomechanical lesion, unspecified: Secondary | ICD-10-CM | POA: Diagnosis not present

## 2012-08-15 DIAGNOSIS — M5137 Other intervertebral disc degeneration, lumbosacral region: Secondary | ICD-10-CM | POA: Diagnosis not present

## 2012-08-15 DIAGNOSIS — M549 Dorsalgia, unspecified: Secondary | ICD-10-CM | POA: Diagnosis not present

## 2012-08-15 DIAGNOSIS — IMO0001 Reserved for inherently not codable concepts without codable children: Secondary | ICD-10-CM | POA: Diagnosis not present

## 2012-08-15 DIAGNOSIS — M5380 Other specified dorsopathies, site unspecified: Secondary | ICD-10-CM | POA: Diagnosis not present

## 2012-08-17 DIAGNOSIS — M5137 Other intervertebral disc degeneration, lumbosacral region: Secondary | ICD-10-CM | POA: Diagnosis not present

## 2012-08-17 DIAGNOSIS — M999 Biomechanical lesion, unspecified: Secondary | ICD-10-CM | POA: Diagnosis not present

## 2012-08-17 DIAGNOSIS — IMO0001 Reserved for inherently not codable concepts without codable children: Secondary | ICD-10-CM | POA: Diagnosis not present

## 2012-08-17 DIAGNOSIS — M5380 Other specified dorsopathies, site unspecified: Secondary | ICD-10-CM | POA: Diagnosis not present

## 2012-08-17 DIAGNOSIS — M549 Dorsalgia, unspecified: Secondary | ICD-10-CM | POA: Diagnosis not present

## 2012-08-21 DIAGNOSIS — M5380 Other specified dorsopathies, site unspecified: Secondary | ICD-10-CM | POA: Diagnosis not present

## 2012-08-21 DIAGNOSIS — M999 Biomechanical lesion, unspecified: Secondary | ICD-10-CM | POA: Diagnosis not present

## 2012-08-21 DIAGNOSIS — M549 Dorsalgia, unspecified: Secondary | ICD-10-CM | POA: Diagnosis not present

## 2012-08-21 DIAGNOSIS — M5137 Other intervertebral disc degeneration, lumbosacral region: Secondary | ICD-10-CM | POA: Diagnosis not present

## 2012-08-21 DIAGNOSIS — IMO0001 Reserved for inherently not codable concepts without codable children: Secondary | ICD-10-CM | POA: Diagnosis not present

## 2012-08-22 ENCOUNTER — Other Ambulatory Visit: Payer: Self-pay | Admitting: Internal Medicine

## 2012-08-22 DIAGNOSIS — M999 Biomechanical lesion, unspecified: Secondary | ICD-10-CM | POA: Diagnosis not present

## 2012-08-22 DIAGNOSIS — M5137 Other intervertebral disc degeneration, lumbosacral region: Secondary | ICD-10-CM | POA: Diagnosis not present

## 2012-08-22 DIAGNOSIS — IMO0001 Reserved for inherently not codable concepts without codable children: Secondary | ICD-10-CM | POA: Diagnosis not present

## 2012-08-22 DIAGNOSIS — M5380 Other specified dorsopathies, site unspecified: Secondary | ICD-10-CM | POA: Diagnosis not present

## 2012-08-22 DIAGNOSIS — M549 Dorsalgia, unspecified: Secondary | ICD-10-CM | POA: Diagnosis not present

## 2012-08-23 NOTE — Telephone Encounter (Signed)
Pt request refill flonase; advised sent 08/22/12 to [facility].

## 2012-08-29 ENCOUNTER — Ambulatory Visit: Payer: Medicare Other | Admitting: Internal Medicine

## 2012-09-06 ENCOUNTER — Ambulatory Visit (INDEPENDENT_AMBULATORY_CARE_PROVIDER_SITE_OTHER): Payer: Medicare Other | Admitting: Internal Medicine

## 2012-09-06 ENCOUNTER — Encounter: Payer: Self-pay | Admitting: Internal Medicine

## 2012-09-06 VITALS — BP 110/60 | HR 75 | Temp 98.4°F | Wt 139.0 lb

## 2012-09-06 DIAGNOSIS — F329 Major depressive disorder, single episode, unspecified: Secondary | ICD-10-CM | POA: Diagnosis not present

## 2012-09-06 DIAGNOSIS — E1142 Type 2 diabetes mellitus with diabetic polyneuropathy: Secondary | ICD-10-CM | POA: Diagnosis not present

## 2012-09-06 DIAGNOSIS — E1149 Type 2 diabetes mellitus with other diabetic neurological complication: Secondary | ICD-10-CM

## 2012-09-06 DIAGNOSIS — E785 Hyperlipidemia, unspecified: Secondary | ICD-10-CM | POA: Diagnosis not present

## 2012-09-06 NOTE — Progress Notes (Signed)
Subjective:    Patient ID: [name], female    DOB: Jun 28, 1945, 67 y.o.   MRN: [id number]  HPI Here with husband  Doing well Reviewed Dr Thurston Hole note Due for repeat CXR in a couple of months Breathing is stable No regular cough  Checks sugars every 2 weeks or so Usually runs under 110 fasting No hypoglycemic reactions Has eye appt in September--notes blurry vision Leg pain is controlled with lyrica (as long as she doesn't forget it)  Mood is good Depression is in remission No problems with anxiety  Current Outpatient Prescriptions on File Prior to Visit  Medication Sig Dispense Refill  . albuterol (PROAIR HFA) 108 (90 BASE) MCG/ACT inhaler Inhale 2 puffs into the lungs 4 (four) times daily as needed.  1 Inhaler  0  . aspirin 81 MG tablet Take 81 mg by mouth daily.        Marland Kitchen atorvastatin (LIPITOR) 40 MG tablet Take 1 tablet (40 mg total) by mouth daily.  90 tablet  2  . buPROPion (WELLBUTRIN SR) 150 MG 12 hr tablet TAKE 1 TABLET BY MOUTH TWICE DAILY  60 tablet  11  . Calcium Carbonate-Vitamin D (CALCIUM-VITAMIN D3 PO) Take 1 tablet by mouth daily.      . Cholecalciferol (VITAMIN D) 1000 UNITS capsule Take 1,000 Units by mouth daily.        . citalopram (CELEXA) 20 MG tablet Take 1 tablet (20 mg total) by mouth daily.  90 tablet  3  . fluticasone (FLONASE) 50 MCG/ACT nasal spray USE 2 SPRAYS NASALLY DAILY  48 g  0  . L-Methylfolate-Algae-B12-B6 (METANX) 3-90.314-2-35 MG CAPS Take 180 mg by mouth 2 (two) times daily.      Marland Kitchen LYRICA 75 MG capsule Take 75 mg by mouth 3 (three) times daily.       . metformin (FORTAMET) 1000 MG (OSM) 24 hr tablet Take 1 tablet (1,000 mg total) by mouth 2 (two) times daily.  180 tablet  3  . mometasone-formoterol (DULERA) 200-5 MCG/ACT AERO Take 2 puffs first thing in am and then another 2 puffs about 12 hours later.  39 g  3  . Multiple Vitamin (MULTIVITAMIN) tablet Take 1 tablet by mouth daily.        Marland Kitchen omeprazole (PRILOSEC) 20 MG capsule Take 20  mg by mouth daily as needed.      . tiotropium (SPIRIVA) 18 MCG inhalation capsule Place 1 capsule (18 mcg total) into inhaler and inhale daily.  90 capsule  3   No current facility-administered medications on file prior to visit.    Allergies  Allergen Reactions  . Budesonide-Formoterol Fumarate Other (See Comments)    REACTION: coughed worse  . Penicillins Rash    REACTION: rash    Past Medical History  Diagnosis Date  . Renal cell carcinoma   . Asthma   . Diabetes mellitus type II   . GERD (gastroesophageal reflux disease)   . Gastroparesis   . Diverticulosis   . Anxiety   . Depression   . History of breast cancer   . History of adenomatous polyp of colon   . Hyperlipidemia   . COPD (chronic obstructive pulmonary disease)   . Pulmonary nodule     multiple  . Osteoarthritis   . Carcinoid tumor of lung 1991  . Mild cognitive impairment     Past Surgical History  Procedure Laterality Date  . Cholecystectomy  1975  . Tonsillectomy and adenoidectomy  1953  . Breast lumpectomy  1998    right breast  . Lung surgery  1994    carcinoid removal  . Laparoscopic nephrectomy  01/2009    right    Family History  Problem Relation Age of Onset  . Colon cancer Father   . Lung cancer Father   . Stroke Mother     History   Social History  . Marital Status: Married    Spouse Name: N/A    Number of Children: 2  . Years of Education: N/A   Occupational History  . retired Photographer    Social History Main Topics  . Smoking status: Former Smoker -- 1.00 packs/day for 30 years    Types: Cigarettes    Quit date: 03/08/1993  . Smokeless tobacco: Never Used  . Alcohol Use: No  . Drug Use: No  . Sexually Active: Not on file   Other Topics Concern  . Not on file   Social History Narrative   Restarted with stress in 6/11   Daily Caffeine Use   Patient does not get regular exercise.          Review of Systems Sleeping well Appetite is fine Weight fairly stable     Objective:   Physical Exam  Constitutional: She appears well-developed and well-nourished. No distress.  Neck: Normal range of motion. Neck supple. No thyromegaly present.  Cardiovascular: Normal rate, regular rhythm and normal heart sounds.  Exam reveals no gallop.   No murmur heard. Pulmonary/Chest: Effort normal. No respiratory distress. She has no wheezes. She has no rales.  Decreased breath sounds but clear  Musculoskeletal: She exhibits no edema and no tenderness.  Lymphadenopathy:    She has no cervical adenopathy.  Psychiatric: She has a normal mood and affect. Her behavior is normal.          Assessment & Plan:

## 2012-09-06 NOTE — Assessment & Plan Note (Signed)
Still seems to have good control Will check A1c 

## 2012-09-06 NOTE — Assessment & Plan Note (Signed)
Mood is stable Continue meds---due to chronicity, will not wean

## 2012-09-06 NOTE — Assessment & Plan Note (Signed)
Lab Results  Component Value Date   LDLCALC 55 02/24/2012   Good control Labs next time

## 2012-09-06 NOTE — Assessment & Plan Note (Signed)
Pain controlled with lyrica 

## 2012-09-18 ENCOUNTER — Other Ambulatory Visit: Payer: Self-pay | Admitting: *Deleted

## 2012-09-18 MED ORDER — CITALOPRAM HYDROBROMIDE 20 MG PO TABS: 20.0000 mg | ORAL_TABLET | Freq: Every day | ORAL | Status: DC

## 2012-09-22 DIAGNOSIS — C649 Malignant neoplasm of unspecified kidney, except renal pelvis: Secondary | ICD-10-CM | POA: Diagnosis not present

## 2012-09-22 DIAGNOSIS — J984 Other disorders of lung: Secondary | ICD-10-CM | POA: Diagnosis not present

## 2012-09-28 DIAGNOSIS — H6500 Acute serous otitis media, unspecified ear: Secondary | ICD-10-CM | POA: Diagnosis not present

## 2012-09-28 DIAGNOSIS — H6120 Impacted cerumen, unspecified ear: Secondary | ICD-10-CM | POA: Diagnosis not present

## 2012-09-28 DIAGNOSIS — H903 Sensorineural hearing loss, bilateral: Secondary | ICD-10-CM | POA: Diagnosis not present

## 2012-10-11 ENCOUNTER — Other Ambulatory Visit: Payer: Self-pay

## 2012-10-16 ENCOUNTER — Telehealth: Payer: Self-pay | Admitting: *Deleted

## 2012-10-16 NOTE — Telephone Encounter (Signed)
Message copied by Christen Butter on Mon Oct 16, 2012 12:07 PM ------      Message from: Sandrea Hughs B      Created: Thu Jul 27, 2012  1:08 PM       Needs ov with f/u cxr ------

## 2012-10-16 NOTE — Telephone Encounter (Signed)
LMTCB for the pt 

## 2012-10-17 NOTE — Telephone Encounter (Signed)
Pt returned Leslie's call.  Set pt up to see MW on 10/26/12 @ 9:45.  Pt is aware to arrive 15-20 minutes early & to check in on 2nd for for CXR.  Pt states nothing further needed at this time.  Antionette Fairy

## 2012-10-26 ENCOUNTER — Ambulatory Visit: Payer: Medicare Other | Admitting: Internal Medicine

## 2012-10-31 ENCOUNTER — Encounter: Payer: Self-pay | Admitting: Internal Medicine

## 2012-10-31 ENCOUNTER — Ambulatory Visit (INDEPENDENT_AMBULATORY_CARE_PROVIDER_SITE_OTHER)
Admission: RE | Admit: 2012-10-31 | Discharge: 2012-10-31 | Disposition: A | Discharge status: Home / Self Care | Payer: Medicare Other | Source: Ambulatory Visit | Attending: Internal Medicine | Admitting: Internal Medicine

## 2012-10-31 ENCOUNTER — Ambulatory Visit (INDEPENDENT_AMBULATORY_CARE_PROVIDER_SITE_OTHER): Payer: Medicare Other | Admitting: Internal Medicine

## 2012-10-31 VITALS — BP 110/70 | HR 73 | Temp 97.8°F | Ht 62.0 in | Wt 141.0 lb

## 2012-10-31 DIAGNOSIS — J449 Chronic obstructive pulmonary disease, unspecified: Secondary | ICD-10-CM

## 2012-10-31 DIAGNOSIS — J984 Other disorders of lung: Secondary | ICD-10-CM | POA: Diagnosis not present

## 2012-10-31 MED ORDER — PREDNISONE (PAK) 10 MG PO TABS: ORAL_TABLET | ORAL | Status: DC

## 2012-10-31 NOTE — Assessment & Plan Note (Addendum)
-   PFT's 04/2005 FEV1 .79  - PFT's 02/15/08 FEV1 .97 (45%) ratio 49, 11% better after Beta 2, DLCO 48% - PFT's 03/18/2011  FEV1 0.97 (47%) ratio 52 and no change p B2,  DLCO 37 corrects to 72% - HFA 90% 10/31/2012 p extensive coaching - 01/15/2011  Walked RA x 1 laps @ 185 ft each stopped due to  desat to 88 p 1 lap> declined 02   The proper method of use, as well as anticipated side effects, of a metered-dose inhaler are discussed and demonstrated to the patient. Improved effectiveness after extensive coaching during this visit to a level of approximately  90% so should continue dulera 200 2bid.  Not as clear she really needs spriva so ok with me to try off  In event of exac needs to remember to treat gerd optimally as may be helping to destabilize her airways     Each maintenance medication was reviewed in detail including most importantly the difference between maintenance and as needed and under what circumstances the prns are to be used.  Please see instructions for details which were reviewed in writing and the patient given a copy.   She and husband continue to struggle with meds from multiple providers > offered to help but  To keep things simple, I have asked the patient to first separate medicines that are perceived as maintenance, that is to be taken daily "no matter what", from those medicines that are taken on only on an as-needed basis and I have given the patient examples of both, and then return to see our NP to generate a  detailed  medication calendar which should be followed until the next physician sees the patient and updates it.

## 2012-10-31 NOTE — Progress Notes (Signed)
Subjective:     Patient ID: [name], female   DOB: May 10, 1945    MRN: [id number]    Brief patient profile:  52 yowf quit smoking 1999 got better then since then off and on cough and sob maybe worse in fall and sometimes worse while sleeping not typically in am's dx with GOLD III copd 04/2005  January 03, 2008 first pulmonary eval for cough minimally productive, not necessarily exacerbating in am or winter. doe > slow adl's. rec  stop  advair and ramapril > better   May 20, 2008 ov no limiting dyspea, minimal cough, variable sob much better after proaire. rec try off spiriva, seemed worse so restarted and occ need proaire.   01/01/2011 f/u ov/[name] cc doe x 2 weeks.  Baseline = ok walking slow flat and one flight of steps RA , indolent onset doe ok at rest  X 2 weeks and feeling a little tight with congested cough but no lateralizing or localized cp. Takes prilosec p meals, proaire not needing before but now helps significantly and uses several times a day with very poor technique.  No purulent sputum. 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  Only use your albuterol(proaire, your red inhaler)  as a rescue medication   Prednisone 10 mg take  4 each am x 2 days,   2 each am x 2 days,  1 each am x2days and stop  Prilosec Take 30-60 min before first meal of the day  GERD diet   03/18/2011 f/u ov/[name] cc  No change doe, cough almost resolved, really thinks dulera helping rec No change rx  07/26/2012 f/u ov/[name] re GOLD III COPD on dulera and spiriva  Chief Complaint  Patient presents with  . Follow-up    Pt states breathing is doing well, no changes since the last visit. Denies any new co's.    rare saba, not limited by breathing but not doing many inclines rec Ok to try off spiriva > did not do it (forgot)    10/31/2012 f/u ov/[name] confused with meds  Chief Complaint  Patient presents with   . Follow-up    Pt states breathing doing well, no new co's today.  "allergies" x 2 weeks > increase cough and nasal congestion but rare perceived need for albuterol  Mucus is scant and white   No obvious daytime variabilty or assoc   cp or chest tightness, subjective wheeze overt sinus or hb symptoms. No unusual exp hx or h/o childhood pna/ asthma or premature birth to her knowledge.   Sleeping ok without nocturnal  or early am exacerbation  of respiratory  c/o's or need for noct saba. Also denies any obvious fluctuation of symptoms with weather or environmental changes or other aggravating or alleviating factors except as outlined above   Current Medications, Allergies, Past Medical History, Past Surgical History, Family History, and Social History were reviewed in [employer] record.  ROS  The following are not active complaints unless bolded sore throat, dysphagia, dental problems, itching, sneezing,  nasal congestion or excess/ purulent secretions, ear ache,   fever, chills, sweats, unintended wt loss, pleuritic or exertional cp, hemoptysis,  orthopnea pnd or leg swelling, presyncope, palpitations, heartburn, abdominal pain, anorexia, nausea, vomiting, diarrhea  or change in bowel or urinary habits, change in stools or urine, dysuria,hematuria,  rash, arthralgias, visual complaints, headache, numbness weakness  or ataxia or problems with walking or coordination,  change in mood/affect or memory.           Past Medical History:  Allergic rhinitis  Asthma  Diabetes mellitus, type II with neuropathy  GERD  HX GASTROPARESIS  DIVERTICULOSIS  Anxiety  Depression  Breast cancer, hx of  Colonic polyps, hx of/ADENOMATOUS 2007  Hyperlipidemia  COPD  - PFT's 2/07 FEV1 .79  - PFT's 02/15/08 FEV1 .97 (45%) ratio 49, 11% better after Beta 2, DLCO 48%  Multiple Pulmonary Nodules  - CT 12/24/08 assoc with R renal mass     Past Surgical History:   Cholecystectomy--1975  Tonsillectomy/A--1953 adenoidectomy  Lumpectomy--right breast 1998 no positive nodes RT only  Lung carcinoid removed (2)--1994 [facility]        Objective:   Physical Exam  amb wf nad   wt   146 02/15/08 >152 May 20, 2008  > 01/01/2011  142 > 142 01/15/2011 > 03/18/2011  141> 138 07/26/2012  > 141 10/31/2012  HEENT   mild turbinate edema. Oropharynx no thrush or excess pnd or cobblestoning. No JVD or cervical adenopathy. Mild accessory muscle hypertrophy. Trachea midline, nl thryroid. Chest was hyperinflated by percussion with diminished breath sounds and moderate increased exp time with mostly upper airway insp and exp wheeze. Hoover sign positive at mid inspiration. Regular rate and rhythm without murmur gallop or rub or increase P2. No edema Abd: no hsm, nl excursion. Ext warm without cyanosis or clubbing      CXR  10/31/2012  Stable multiple small nodularities throughout bilateral lungs. This is compatible previously described chronic atypical infection such as MAI. No focal pneumonia. COPD     Assessment:

## 2012-10-31 NOTE — Patient Instructions (Addendum)
In event of any respiratory flare > prilosec 20mg  Take 30-60 min before first meal of the day and pepcid 20 mg at bedtime until all better for a week   Prednisone 10 mg take  4 each am x 2 days,   2 each am x 2 days,  1 each am x 2 days and stop   Work on inhaler technique:  relax and gently blow all the way out then take a nice smooth deep breath back in, triggering the inhaler at same time you start breathing in.  Hold for up to 5 seconds if you can.  Rinse and gargle with water when done  See [name] 4  weeks with all your medications, even over the counter meds, separated in two separate bags, the ones you take no matter what vs the ones you stop once you feel better and take only as needed when you feel you need them.   Tracy  will generate for you a new user friendly medication calendar that will put [place] all on the same page re: your medication use.     Without this process, it simply isn't possible to assure that we are providing  your outpatient care  with  the attention to detail we feel you deserve.   If we cannot assure that you're getting that kind of care,  then we cannot manage your problem effectively from this clinic.  Once you have seen Tracy and we are sure that we're all on the same page with your medication use she will arrange follow up with me.

## 2012-10-31 NOTE — Assessment & Plan Note (Signed)
-   CT Chest 10/27/10 Diffuse peribronchovascular nodularity with minimal  bronchiectasis, unchanged from baseline examination of 07/18/2009.  Differential diagnosis includes Mycobacterium avium complex (MAC)  and sarcoid.  2. Pulmonary arterial hypertension - associated with RML subsegmental atx 07/26/12 > f/u cxr 10/31/2012 > no change   C/w inactive or clinically insignificant MAI, would reserve invasive dx for more convincing symptoms and / or clear radiographic progression on plain cxr

## 2012-11-01 ENCOUNTER — Telehealth: Payer: Self-pay | Admitting: Internal Medicine

## 2012-11-01 MED ORDER — PREDNISONE (PAK) 10 MG PO TABS: ORAL_TABLET | ORAL | Status: DC

## 2012-11-01 MED ORDER — OMEPRAZOLE 20 MG PO CPDR: 20.0000 mg | DELAYED_RELEASE_CAPSULE | Freq: Every day | ORAL | Status: DC | PRN

## 2012-11-01 NOTE — Telephone Encounter (Signed)
Pt informed of cxr results per Dr Wert 

## 2012-11-01 NOTE — Telephone Encounter (Signed)
Spoke with the pt Rx for pred sent to her local [facility] and omeprazole to [facility]  Pt states nothing further needed

## 2012-11-02 ENCOUNTER — Telehealth: Payer: Self-pay | Admitting: Internal Medicine

## 2012-11-02 ENCOUNTER — Other Ambulatory Visit: Payer: Self-pay | Admitting: *Deleted

## 2012-11-02 DIAGNOSIS — J984 Other disorders of lung: Secondary | ICD-10-CM

## 2012-11-02 MED ORDER — PREDNISONE (PAK) 10 MG PO TABS: ORAL_TABLET | ORAL | Status: DC

## 2012-11-02 NOTE — Telephone Encounter (Signed)
Called [facility].  The omeprazole was to be sent to [facility] and the pred taper was to be sent to [facility].  This has been [facility] to [facility].  The pred taper has been d/c from [facility].  Nothing further is needed.

## 2012-11-20 DIAGNOSIS — Z79899 Other long term (current) drug therapy: Secondary | ICD-10-CM | POA: Diagnosis not present

## 2012-11-20 DIAGNOSIS — J984 Other disorders of lung: Secondary | ICD-10-CM | POA: Diagnosis not present

## 2012-11-22 ENCOUNTER — Encounter: Payer: Self-pay | Admitting: Cardiothoracic Surgery

## 2012-11-22 ENCOUNTER — Ambulatory Visit
Admission: RE | Admit: 2012-11-22 | Discharge: 2012-11-22 | Disposition: A | Discharge status: Home / Self Care | Payer: Medicare Other | Source: Ambulatory Visit | Attending: Cardiothoracic Surgery | Admitting: Cardiothoracic Surgery

## 2012-11-22 ENCOUNTER — Ambulatory Visit (INDEPENDENT_AMBULATORY_CARE_PROVIDER_SITE_OTHER): Payer: Medicare Other | Admitting: Cardiothoracic Surgery

## 2012-11-22 VITALS — BP 120/73 | HR 75 | Resp 16 | Ht 64.0 in | Wt 140.0 lb

## 2012-11-22 DIAGNOSIS — Z85528 Personal history of other malignant neoplasm of kidney: Secondary | ICD-10-CM | POA: Diagnosis not present

## 2012-11-22 DIAGNOSIS — Z853 Personal history of malignant neoplasm of breast: Secondary | ICD-10-CM | POA: Diagnosis not present

## 2012-11-22 DIAGNOSIS — Z8709 Personal history of other diseases of the respiratory system: Secondary | ICD-10-CM | POA: Diagnosis not present

## 2012-11-22 DIAGNOSIS — J984 Other disorders of lung: Secondary | ICD-10-CM | POA: Diagnosis not present

## 2012-11-22 DIAGNOSIS — H2510 Age-related nuclear cataract, unspecified eye: Secondary | ICD-10-CM | POA: Diagnosis not present

## 2012-11-22 DIAGNOSIS — Z87898 Personal history of other specified conditions: Secondary | ICD-10-CM

## 2012-11-22 MED ORDER — IOHEXOL 300 MG/ML  SOLN
75.0000 mL | Freq: Once | INTRAMUSCULAR | Status: AC | PRN
  Administered 2012-11-22: 75 mL via INTRAVENOUS

## 2012-11-22 NOTE — Progress Notes (Signed)
PCP is Tillman Abide, MD Referring Provider is Marcine Matar, MD  Chief Complaint  Patient presents with  . Routine Post Op    1 yr f/u with CT CHEST    HPI: 67 year old Caucasian female with a smoking history and severe COPD-FEV1 0.9, followed by Dr. Sherene Sires and previously followed by Dr. Edwyna Shell for bilateral pulmonary nodules. She had right thoracotomy and pulmonary resection for bronchial carcinoid at [facility] in 1994-Dr. Artis Flock. She also has had subsequently right radical nephrectomy for renal cell carcinoma and right breast cancer-lumpectomy.  She was seen by Dr. Sherene Sires in may. He continued her multiple inhalers and bronchodilators. She had an episode of allergic bronchitis-sinusitis around Labor Day resolved without antibiotic She states her breathing is been stable. No increased work of breathing, no hemoptysis. She is gaining weight over the past 6 months associated with starting the Lyrica for diabetic neuropathy Past Medical History  Diagnosis Date  . Renal cell carcinoma   . Asthma   . Diabetes mellitus type II   . GERD (gastroesophageal reflux disease)   . Gastroparesis   . Diverticulosis   . Anxiety   . Depression   . History of breast cancer   . History of adenomatous polyp of colon   . Hyperlipidemia   . COPD (chronic obstructive pulmonary disease)   . Pulmonary nodule     multiple  . Osteoarthritis   . Carcinoid tumor of lung 1991  . Mild cognitive impairment     Past Surgical History  Procedure Laterality Date  . Cholecystectomy  1975  . Tonsillectomy and adenoidectomy  1953  . Breast lumpectomy  1998    right breast  . Lung surgery  1994    carcinoid removal  . Laparoscopic nephrectomy  01/2009    right    Family History  Problem Relation Age of Onset  . Colon cancer Father   . Lung cancer Father   . Stroke Mother     Social History History  Substance Use Topics  . Smoking status: Former Smoker -- 1.00 packs/day for 30 years    Types:  Cigarettes    Quit date: 03/08/1993  . Smokeless tobacco: Never Used  . Alcohol Use: No    Current Outpatient Prescriptions  Medication Sig Dispense Refill  . albuterol (PROAIR HFA) 108 (90 BASE) MCG/ACT inhaler Inhale 2 puffs into the lungs 4 (four) times daily as needed.  1 Inhaler  0  . atorvastatin (LIPITOR) 40 MG tablet Take 1 tablet (40 mg total) by mouth daily.  90 tablet  2  . buPROPion (WELLBUTRIN SR) 150 MG 12 hr tablet TAKE 1 TABLET BY MOUTH TWICE DAILY  60 tablet  11  . Calcium Carbonate-Vitamin D (CALCIUM-VITAMIN D3 PO) Take 1 tablet by mouth daily.      . Cholecalciferol (VITAMIN D) 1000 UNITS capsule Take 1,000 Units by mouth daily.        . citalopram (CELEXA) 20 MG tablet Take 1 tablet (20 mg total) by mouth daily. Or as directed  90 tablet  0  . fluticasone (FLONASE) 50 MCG/ACT nasal spray USE 2 SPRAYS NASALLY DAILY  48 g  0  . L-Methylfolate-Algae-B12-B6 (METANX) 3-90.314-2-35 MG CAPS Take 180 mg by mouth 2 (two) times daily.      Marland Kitchen LYRICA 75 MG capsule Take 75 mg by mouth 3 (three) times daily.       . metformin (FORTAMET) 1000 MG (OSM) 24 hr tablet Take 1 tablet (1,000 mg total) by mouth 2 (two)  times daily.  180 tablet  3  . mometasone-formoterol (DULERA) 200-5 MCG/ACT AERO Take 2 puffs first thing in am and then another 2 puffs about 12 hours later.  39 g  3  . Multiple Vitamin (MULTIVITAMIN) tablet Take 1 tablet by mouth daily.        Marland Kitchen omeprazole (PRILOSEC) 20 MG capsule Take 1 capsule (20 mg total) by mouth daily as needed.  90 capsule  3  . predniSONE (STERAPRED UNI-PAK) 10 MG tablet Prednisone 10 mg take  4 each am x 2 days,   2 each am x 2 days,  1 each am x2days and stop  14 tablet  0  . tiotropium (SPIRIVA) 18 MCG inhalation capsule Place 1 capsule (18 mcg total) into inhaler and inhale daily.  90 capsule  3   No current facility-administered medications for this visit.    Allergies  Allergen Reactions  . Budesonide-Formoterol Fumarate Other (See Comments)     REACTION: coughed worse  . Penicillins Rash    REACTION: rash    Review of Systems Slight weight gain-8 pounds associated with Lyrica No increased work of breathing or shortness of breath, sleeping well No angina, no ankle edema No hemoptysis No falls BP 120/73  Pulse 75  Resp 16  Ht 5\' 4"  (1.626 m)  Wt 140 lb (63.504 kg)  BMI 24.02 kg/m2  SpO2 92% Physical Exam Alert and responsive HEENT normocephalic Neck without palpable adenopathy or JVD Chest with a well-healed right thoracotomy incision, scattered rhonchi bilateral Cardiac regular rhythm without murmur Extremities with fragile skin no tenderness or edema Neuro intact  Diagnostic Tests: CT. scan chest reviewed and compared to CT chest last year--I see no significant changes in the multiple pulmonary nodules which are chronic. Official radiologist report pending  Impression: COPD, history of carcinoid of right lung, with long-standing bilateral pulmonary nodules appear to be unchanged with annual surveillance CT scan. Will review final radiology report. Unless new data is provided will have patient return with CT chest in one year -no contrast to 2 previous nephrectomy  Plan: Return with chest CT without contrast in one year

## 2012-11-23 DIAGNOSIS — J45909 Unspecified asthma, uncomplicated: Secondary | ICD-10-CM | POA: Diagnosis not present

## 2012-11-23 DIAGNOSIS — J069 Acute upper respiratory infection, unspecified: Secondary | ICD-10-CM | POA: Diagnosis not present

## 2012-11-24 LAB — CREATININE, SERUM: Creat: 1.21 mg/dL — ABNORMAL HIGH (ref 0.50–1.10)

## 2012-11-24 LAB — BUN: BUN: 15 mg/dL (ref 6–23)

## 2012-11-27 ENCOUNTER — Ambulatory Visit (INDEPENDENT_AMBULATORY_CARE_PROVIDER_SITE_OTHER): Payer: Medicare Other | Admitting: Adult Health

## 2012-11-27 ENCOUNTER — Encounter: Payer: Self-pay | Admitting: Adult Health

## 2012-11-27 VITALS — BP 100/64 | HR 86 | Temp 97.4°F | Ht 64.0 in | Wt 141.6 lb

## 2012-11-27 DIAGNOSIS — J449 Chronic obstructive pulmonary disease, unspecified: Secondary | ICD-10-CM

## 2012-11-27 DIAGNOSIS — Z23 Encounter for immunization: Secondary | ICD-10-CM

## 2012-11-27 NOTE — Progress Notes (Signed)
Subjective:     Patient ID: [name], female   DOB: [birth year]    MRN: [id number]    Brief patient profile:  41 yowf quit smoking 1999 got better then since then off and on cough and sob maybe worse in fall and sometimes worse while sleeping not typically in am's dx with GOLD III copd 04/2005  January 03, 2008 first pulmonary eval for cough minimally productive, not necessarily exacerbating in am or winter. doe > slow adl's. rec  stop  advair and ramapril > better   May 20, 2008 ov no limiting dyspea, minimal cough, variable sob much better after proaire. rec try off spiriva, seemed worse so restarted and occ need proaire.   01/01/2011 f/u ov/Wert cc doe x 2 weeks.  Baseline = ok walking slow flat and one flight of steps RA , indolent onset doe ok at rest  X 2 weeks and feeling a little tight with congested cough but no lateralizing or localized cp. Takes prilosec p meals, proaire not needing before but now helps significantly and uses several times a day with very poor technique.  No purulent sputum. 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  Only use your albuterol(proaire, your red inhaler)  as a rescue medication   Prednisone 10 mg take  4 each am x 2 days,   2 each am x 2 days,  1 each am x2days and stop  Prilosec Take 30-60 min before first meal of the day  GERD diet   03/18/2011 f/u ov/Wert cc  No change doe, cough almost resolved, really thinks dulera helping rec No change rx  07/26/2012 f/u ov/Wert re GOLD III COPD on dulera and spiriva  Chief Complaint  Patient presents with  . Follow-up    Pt states breathing is doing well, no changes since the last visit. Denies any new co's.    rare saba, not limited by breathing but not doing many inclines rec Ok to try off spiriva > did not do it (forgot)    10/31/2012 f/u ov/Wert confused with meds  Chief Complaint  Patient presents with   . Follow-up    Pt states breathing doing well, no new co's today.  "allergies" x 2 weeks > increase cough and nasal congestion but rare perceived need for albuterol  Mucus is scant and white >>Pred taper and PPI/pepcid   11/27/2012 Follow up and Med review  Returns for a 1 month follow up and med calendar - pt brought all meds with her today.  reports breathing is doing well; no new complaints.  Would like flu shot today. Seen last month with copd flare /cough. Tx with PPI/pepcid and prednisone taper. Feeling much better with cough resolved. CAT score 17  We reveiwed all her meds and organized them into a med calendar . Appear to be taking meds correctly.  She was seen by CTS with stable CT chest for serial follow up for lung nodules. Suspected to have MAC related changes.  No hemoptysis, chest pain or orthopnea.    Current Medications, Allergies, Past Medical History, Past Surgical History, Family History, and Social History were reviewed in [employer] record.  ROS  The following are not active complaints unless bolded sore throat, dysphagia, dental problems, itching, sneezing,  nasal congestion or excess/ purulent secretions, ear ache,   fever, chills, sweats, unintended wt loss, pleuritic or  exertional cp, hemoptysis,  orthopnea pnd or leg swelling, presyncope, palpitations, heartburn, abdominal pain, anorexia, nausea, vomiting, diarrhea  or change in bowel or urinary habits, change in stools or urine, dysuria,hematuria,  rash, arthralgias, visual complaints, headache, numbness weakness or ataxia or problems with walking or coordination,  change in mood/affect or memory.           Past Medical History:  Allergic rhinitis  Asthma  Diabetes mellitus, type II with neuropathy  GERD  HX GASTROPARESIS  DIVERTICULOSIS  Anxiety  Depression  Breast cancer, hx of  Colonic polyps, hx of/ADENOMATOUS 2007  Hyperlipidemia  COPD  - PFT's 2/07 FEV1 .79  - PFT's  02/15/08 FEV1 .97 (45%) ratio 49, 11% better after Beta 2, DLCO 48%  Multiple Pulmonary Nodules  - CT 12/24/08 assoc with R renal mass     Past Surgical History:  Cholecystectomy--1975  Tonsillectomy/A--1953 adenoidectomy  Lumpectomy--right breast 1998 no positive nodes RT only  Lung carcinoid removed (2)--1994 [facility]        Objective:   Physical Exam  amb wf nad   wt   146 02/15/08 >152 May 20, 2008  > 01/01/2011  142 > 142 01/15/2011 > 03/18/2011  141> 138 07/26/2012  > 141 10/31/2012 >141 11/27/2012  HEENT   mild turbinate edema. Oropharynx no thrush or excess pnd or cobblestoning. No JVD or cervical adenopathy. Mild accessory muscle hypertrophy. Trachea midline, nl thryroid. Chest was hyperinflated by percussion with diminished breath sounds and moderate increased exp time with mostly upper airway insp and exp wheeze. Hoover sign positive at mid inspiration. Regular rate and rhythm without murmur gallop or rub or increase P2. No edema Abd: no hsm, nl excursion. Ext warm without cyanosis or clubbing      CXR  10/31/2012  Stable multiple small nodularities throughout bilateral lungs. This is compatible previously described chronic atypical infection such as MAI. No focal pneumonia. COPD   CT chest 11/27/2012 Relatively stable multiple noncalcified lung nodules diffusely throughout the lungs most consistent with atypical infection such as Mycobacterium avium complex.   Assessment:

## 2012-11-27 NOTE — Addendum Note (Signed)
Addended by: Boone Master E on: 11/27/2012 03:47 PM   Modules accepted: Orders, Medications

## 2012-11-27 NOTE — Patient Instructions (Addendum)
Follow med calendar closely and bring to each visit.  Flu shot today  follow up Dr. Sherene Sires  In 3 months and As needed

## 2012-11-27 NOTE — Assessment & Plan Note (Addendum)
Recent flare now resolved   Patient's medications were reviewed today and patient education was given. Computerized medication calendar was adjusted/completed   Plan  Follow med calendar closely and bring to each visit.  Flu shot today  follow up Dr. Sherene Sires  In 3 months and As needed

## 2012-11-28 ENCOUNTER — Other Ambulatory Visit: Payer: Self-pay

## 2012-11-28 DIAGNOSIS — Z1231 Encounter for screening mammogram for malignant neoplasm of breast: Secondary | ICD-10-CM

## 2012-11-29 ENCOUNTER — Telehealth: Payer: Self-pay | Admitting: *Deleted

## 2012-11-29 ENCOUNTER — Encounter: Payer: Self-pay | Admitting: Internal Medicine

## 2012-11-29 ENCOUNTER — Ambulatory Visit (INDEPENDENT_AMBULATORY_CARE_PROVIDER_SITE_OTHER): Payer: Medicare Other | Admitting: Internal Medicine

## 2012-11-29 VITALS — BP 110/60 | HR 91 | Wt 141.0 lb

## 2012-11-29 DIAGNOSIS — G3184 Mild cognitive impairment, so stated: Secondary | ICD-10-CM

## 2012-11-29 DIAGNOSIS — R413 Other amnesia: Secondary | ICD-10-CM

## 2012-11-29 DIAGNOSIS — Z113 Encounter for screening for infections with a predominantly sexual mode of transmission: Secondary | ICD-10-CM | POA: Diagnosis not present

## 2012-11-29 LAB — VITAMIN B12: Vitamin B-12: >1500 pg/mL — ABNORMAL HIGH (ref 211–911)

## 2012-11-29 MED ORDER — ALPRAZOLAM 0.25 MG PO TABS: ORAL_TABLET | ORAL | Status: DC

## 2012-11-29 NOTE — Assessment & Plan Note (Signed)
No clear signs of dementia as no apraxia Still pays bills, etc  Will have her stop the statin Check B12 again Check RPR also Has had some walking problems--related to neuropathy. No incontinence. Will check MRI Consider donepezil

## 2012-11-29 NOTE — Progress Notes (Signed)
Subjective:    Patient ID: [name], female    DOB: 05-Feb-1946, 67 y.o.   MRN: [id number]  HPI Feels her memory is worsening Here with husband  "I can't remember much of anything" Has to write down all appointments---keeps a note pad Forgot that she had stopped aspirin Never lost in car Does repeat herself at times She still pays the bills but the checkbook doesn't balance lately  No obvious apraxia---hasn't given up any tasks  Current Outpatient Prescriptions on File Prior to Visit  Medication Sig Dispense Refill  . albuterol (PROVENTIL HFA;VENTOLIN HFA) 108 (90 BASE) MCG/ACT inhaler Inhale 2 puffs into the lungs every 4 (four) hours as needed for shortness of breath.      Marland Kitchen atorvastatin (LIPITOR) 40 MG tablet Take 1 tablet (40 mg total) by mouth daily.  90 tablet  2  . Cholecalciferol (VITAMIN D3) 5000 UNITS CAPS Take 1 capsule by mouth every morning.      . citalopram (CELEXA) 20 MG tablet 1/2 tab by mouth every morning      . Cyanocobalamin (VITAMIN B-12) 2500 MCG SUBL Place 1 tablet under the tongue every morning.      . famotidine (PEPCID) 20 MG tablet Take 20 mg by mouth at bedtime as needed (for flare of cough).      . fexofenadine (ALLEGRA) 180 MG tablet Take 180 mg by mouth daily as needed (nasal drainage).      . fluticasone (FLONASE) 50 MCG/ACT nasal spray USE 2 SPRAYS NASALLY DAILY  48 g  0  . L-Methylfolate-Algae-B12-B6 (METANX) 3-90.314-2-35 MG CAPS Take 180 mg by mouth 2 (two) times daily.      Marland Kitchen LYRICA 75 MG capsule Take 75 mg by mouth 3 (three) times daily.       . metformin (FORTAMET) 1000 MG (OSM) 24 hr tablet Take 1 tablet (1,000 mg total) by mouth 2 (two) times daily.  180 tablet  3  . mometasone-formoterol (DULERA) 200-5 MCG/ACT AERO Take 2 puffs first thing in am and then another 2 puffs about 12 hours later.  39 g  3  . Multiple Vitamin (MULTIVITAMIN) tablet Take 1 tablet by mouth every morning.       . Omega-3 Fatty Acids (FISH OIL) 1200 MG CAPS Take 1  capsule by mouth every morning. (place in the freezer)      . omeprazole (PRILOSEC) 20 MG capsule Take 1 capsule (20 mg total) by mouth daily as needed.  90 capsule  3  . tiotropium (SPIRIVA) 18 MCG inhalation capsule Place 1 capsule (18 mcg total) into inhaler and inhale daily.  90 capsule  3  . vitamin C (ASCORBIC ACID) 500 MG tablet Take 500 mg by mouth every morning.       No current facility-administered medications on file prior to visit.    Allergies  Allergen Reactions  . Budesonide-Formoterol Fumarate Other (See Comments)    REACTION: coughed worse  . Penicillins Rash    REACTION: rash    Past Medical History  Diagnosis Date  . Renal cell carcinoma   . Asthma   . Diabetes mellitus type II   . GERD (gastroesophageal reflux disease)   . Gastroparesis   . Diverticulosis   . Anxiety   . Depression   . History of breast cancer   . History of adenomatous polyp of colon   . Hyperlipidemia   . COPD (chronic obstructive pulmonary disease)   . Pulmonary nodule     multiple  . Osteoarthritis   .  Carcinoid tumor of lung 1991  . Mild cognitive impairment     Past Surgical History  Procedure Laterality Date  . Cholecystectomy  1975  . Tonsillectomy and adenoidectomy  1953  . Breast lumpectomy  1998    right breast  . Lung surgery  1994    carcinoid removal  . Laparoscopic nephrectomy  01/2009    right    Family History  Problem Relation Age of Onset  . Colon cancer Father   . Lung cancer Father   . Stroke Mother     History   Social History  . Marital Status: Married    Spouse Name: N/A    Number of Children: 2  . Years of Education: N/A   Occupational History  . retired Photographer    Social History Main Topics  . Smoking status: Former Smoker -- 1.00 packs/day for 30 years    Types: Cigarettes    Quit date: 03/08/1993  . Smokeless tobacco: Never Used  . Alcohol Use: No  . Drug Use: No  . Sexual Activity: Not on file   Other Topics Concern  . Not  on file   Social History Narrative   Restarted with stress in 6/11   Daily Caffeine Use   Patient does not get regular exercise.          Review of Systems Sleeps well Appetite is fine Weight stable    Objective:   Physical Exam  Neurological:  Clock draw normal--- but didn't remember any of 3 words Animal fluency 15 in 1 minute  Psychiatric: She has a normal mood and affect. Her behavior is normal.          Assessment & Plan:

## 2012-11-29 NOTE — Patient Instructions (Signed)
Please stop the atorvastatin for now. 

## 2012-11-29 NOTE — Addendum Note (Signed)
Addended by: Alvina Chou on: 11/29/2012 01:02 PM   Modules accepted: Orders

## 2012-11-29 NOTE — Telephone Encounter (Signed)
Message copied by Sueanne Margarita on Wed Nov 29, 2012  2:07 PM ------      Message from: Tillman Abide I      Created: Wed Nov 29, 2012  1:25 PM      Regarding: FW: Medication needed       Please convert this to a phone note      Okay to prescribe alprazolam 0.25mg       #6 x 0      Take 1 -2 before going to the hospital      Then she can repeat the 1-2 when getting ready for the MRI if she still feels nervous            Dr Alphonsus Sias      ----- Message -----         From: Buena Irish         Sent: 11/29/2012   1:17 PM           To: Karie Schwalbe, MD      Subject: Medication needed                                        Pt is claustrophobic and would medication she can take the evening of her MRI.  12/08/12 6:30 pm [facility].  Pharmacy of choice is [facility] [address].  Best number to call pt is [phone].       ------

## 2012-11-29 NOTE — Telephone Encounter (Signed)
Spoke with patient and advised results rx called into pharmacy  

## 2012-12-06 LAB — HM DIABETES EYE EXAM

## 2012-12-08 ENCOUNTER — Other Ambulatory Visit: Payer: Self-pay | Admitting: Internal Medicine

## 2012-12-08 ENCOUNTER — Ambulatory Visit
Admission: RE | Admit: 2012-12-08 | Discharge: 2012-12-08 | Disposition: A | Discharge status: Home / Self Care | Payer: Medicare Other | Source: Ambulatory Visit | Attending: Internal Medicine | Admitting: Internal Medicine

## 2012-12-08 DIAGNOSIS — R413 Other amnesia: Secondary | ICD-10-CM

## 2012-12-08 MED ORDER — GADOBENATE DIMEGLUMINE 529 MG/ML IV SOLN
7.0000 mL | Freq: Once | INTRAVENOUS | Status: AC | PRN
  Administered 2012-12-08: 7 mL via INTRAVENOUS

## 2012-12-29 ENCOUNTER — Encounter: Payer: Self-pay | Admitting: Internal Medicine

## 2012-12-29 ENCOUNTER — Ambulatory Visit (INDEPENDENT_AMBULATORY_CARE_PROVIDER_SITE_OTHER): Payer: Medicare Other | Admitting: Internal Medicine

## 2012-12-29 VITALS — BP 130/70 | HR 81 | Temp 98.6°F | Wt 142.0 lb

## 2012-12-29 DIAGNOSIS — G3184 Mild cognitive impairment, so stated: Secondary | ICD-10-CM

## 2012-12-29 NOTE — Assessment & Plan Note (Signed)
Memory stable No real apraxia or problems with executive function Will restart the statin Discussed trial with donepezil---will hold off unless there is further decline

## 2012-12-29 NOTE — Patient Instructions (Signed)
Please restart the atorvastatin

## 2012-12-29 NOTE — Progress Notes (Signed)
Subjective:    Patient ID: [name], female    DOB: [birth year], 67 y.o.   MRN: [id number]  HPI Here with hyusband  Has been off statin this month No apparent difference  No real change  Still has intact executive function---planning okay with help of lists Still pays the bills, cooks, etc  Current Outpatient Prescriptions on File Prior to Visit  Medication Sig Dispense Refill  . albuterol (PROVENTIL HFA;VENTOLIN HFA) 108 (90 BASE) MCG/ACT inhaler Inhale 2 puffs into the lungs every 4 (four) hours as needed for shortness of breath.      . ALPRAZolam (XANAX) 0.25 MG tablet Take 1-2 tablets 1 hour before procedure, may repeat once procedure starts  6 tablet  0  . atorvastatin (LIPITOR) 40 MG tablet Take 1 tablet (40 mg total) by mouth daily.  90 tablet  2  . Cholecalciferol (VITAMIN D3) 5000 UNITS CAPS Take 1 capsule by mouth every morning.      . citalopram (CELEXA) 20 MG tablet 1/2 tab by mouth every morning      . Cyanocobalamin (VITAMIN B-12) 2500 MCG SUBL Place 1 tablet under the tongue every morning.      . famotidine (PEPCID) 20 MG tablet Take 20 mg by mouth at bedtime as needed (for flare of cough).      . fexofenadine (ALLEGRA) 180 MG tablet Take 180 mg by mouth daily as needed (nasal drainage).      . fluticasone (FLONASE) 50 MCG/ACT nasal spray USE 2 SPRAYS NASALLY DAILY  48 g  0  . L-Methylfolate-Algae-B12-B6 (METANX) 3-90.314-2-35 MG CAPS Take 180 mg by mouth 2 (two) times daily.      Marland Kitchen LYRICA 75 MG capsule Take 75 mg by mouth 3 (three) times daily.       . metformin (FORTAMET) 1000 MG (OSM) 24 hr tablet Take 1 tablet (1,000 mg total) by mouth 2 (two) times daily.  180 tablet  3  . mometasone-formoterol (DULERA) 200-5 MCG/ACT AERO Take 2 puffs first thing in am and then another 2 puffs about 12 hours later.  39 g  3  . Multiple Vitamin (MULTIVITAMIN) tablet Take 1 tablet by mouth every morning.       . Omega-3 Fatty Acids (FISH OIL) 1200 MG CAPS Take 1 capsule by mouth  every morning. (place in the freezer)      . omeprazole (PRILOSEC) 20 MG capsule Take 1 capsule (20 mg total) by mouth daily as needed.  90 capsule  3  . tiotropium (SPIRIVA) 18 MCG inhalation capsule Place 1 capsule (18 mcg total) into inhaler and inhale daily.  90 capsule  3  . vitamin C (ASCORBIC ACID) 500 MG tablet Take 500 mg by mouth every morning.       No current facility-administered medications on file prior to visit.    Allergies  Allergen Reactions  . Budesonide-Formoterol Fumarate Other (See Comments)    REACTION: coughed worse  . Penicillins Rash    REACTION: rash    Past Medical History  Diagnosis Date  . Renal cell carcinoma   . Asthma   . Diabetes mellitus type II   . GERD (gastroesophageal reflux disease)   . Gastroparesis   . Diverticulosis   . Anxiety   . Depression   . History of breast cancer   . History of adenomatous polyp of colon   . Hyperlipidemia   . COPD (chronic obstructive pulmonary disease)   . Pulmonary nodule     multiple  . Osteoarthritis   .  Carcinoid tumor of lung 1991  . Mild cognitive impairment     Past Surgical History  Procedure Laterality Date  . Cholecystectomy  1975  . Tonsillectomy and adenoidectomy  1953  . Breast lumpectomy  1998    right breast  . Lung surgery  1994    carcinoid removal  . Laparoscopic nephrectomy  01/2009    right    Family History  Problem Relation Age of Onset  . Colon cancer Father   . Lung cancer Father   . Stroke Mother     History   Social History  . Marital Status: Married    Spouse Name: N/A    Number of Children: 2  . Years of Education: N/A   Occupational History  . retired Photographer    Social History Main Topics  . Smoking status: Former Smoker -- 1.00 packs/day for 30 years    Types: Cigarettes    Quit date: 03/08/1993  . Smokeless tobacco: Never Used  . Alcohol Use: No  . Drug Use: No  . Sexual Activity: Not on file   Other Topics Concern  . Not on file   Social  History Narrative   Restarted with stress in 6/11   Daily Caffeine Use   Patient does not get regular exercise.          Review of Systems Sleeps well Appetite is fine    Objective:   Physical Exam  Constitutional: She appears well-developed and well-nourished. No distress.  Psychiatric: She has a normal mood and affect. Her behavior is normal.         Assessment & Plan:

## 2013-01-01 ENCOUNTER — Ambulatory Visit
Admission: RE | Admit: 2013-01-01 | Discharge: 2013-01-01 | Disposition: A | Discharge status: Home / Self Care | Payer: Medicare Other | Source: Ambulatory Visit

## 2013-01-01 DIAGNOSIS — Z1231 Encounter for screening mammogram for malignant neoplasm of breast: Secondary | ICD-10-CM | POA: Diagnosis not present

## 2013-01-05 ENCOUNTER — Other Ambulatory Visit: Payer: Self-pay | Admitting: Internal Medicine

## 2013-01-05 DIAGNOSIS — R928 Other abnormal and inconclusive findings on diagnostic imaging of breast: Secondary | ICD-10-CM

## 2013-01-24 ENCOUNTER — Ambulatory Visit
Admission: RE | Admit: 2013-01-24 | Discharge: 2013-01-24 | Disposition: A | Discharge status: Home / Self Care | Payer: Medicare Other | Source: Ambulatory Visit | Attending: Internal Medicine | Admitting: Internal Medicine

## 2013-01-24 DIAGNOSIS — R928 Other abnormal and inconclusive findings on diagnostic imaging of breast: Secondary | ICD-10-CM | POA: Diagnosis not present

## 2013-01-26 ENCOUNTER — Other Ambulatory Visit: Payer: Self-pay | Admitting: Internal Medicine

## 2013-01-30 DIAGNOSIS — E119 Type 2 diabetes mellitus without complications: Secondary | ICD-10-CM | POA: Diagnosis not present

## 2013-02-06 DIAGNOSIS — Z23 Encounter for immunization: Secondary | ICD-10-CM | POA: Diagnosis not present

## 2013-02-06 DIAGNOSIS — R0609 Other forms of dyspnea: Secondary | ICD-10-CM | POA: Diagnosis not present

## 2013-02-06 DIAGNOSIS — E119 Type 2 diabetes mellitus without complications: Secondary | ICD-10-CM | POA: Diagnosis not present

## 2013-02-20 ENCOUNTER — Ambulatory Visit (INDEPENDENT_AMBULATORY_CARE_PROVIDER_SITE_OTHER): Payer: Medicare Other | Admitting: Internal Medicine

## 2013-02-20 ENCOUNTER — Encounter: Payer: Self-pay | Admitting: Internal Medicine

## 2013-02-20 VITALS — BP 102/64 | HR 71 | Temp 97.9°F | Ht 62.0 in | Wt 141.6 lb

## 2013-02-20 DIAGNOSIS — J984 Other disorders of lung: Secondary | ICD-10-CM | POA: Diagnosis not present

## 2013-02-20 DIAGNOSIS — J449 Chronic obstructive pulmonary disease, unspecified: Secondary | ICD-10-CM | POA: Diagnosis not present

## 2013-02-20 NOTE — Progress Notes (Signed)
Subjective:     Patient ID: [name], female   DOB: 05-Jul-1945    MRN: [id number]    Brief patient profile:  42 yowf quit smoking 1999 got better then since then off and on cough and sob maybe worse in fall and sometimes worse while sleeping not typically in am's dx with GOLD III copd 04/2005  January 03, 2008 first pulmonary eval for cough minimally productive, not necessarily exacerbating in am or winter. doe > slow adl's. rec  stop  advair and ramapril > better   May 20, 2008 ov no limiting dyspea, minimal cough, variable sob much better after proaire. rec try off spiriva, seemed worse so restarted and occ need proaire.   01/01/2011 f/u ov/Zaiyah Sottile cc doe x 2 weeks.  Baseline = ok walking slow flat and one flight of steps RA , indolent onset doe ok at rest  X 2 weeks and feeling a little tight with congested cough but no lateralizing or localized cp. Takes prilosec p meals, proaire not needing before but now helps significantly and uses several times a day with very poor technique.  No purulent sputum. 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  Only use your albuterol(proaire, your red inhaler)  as a rescue medication   Prednisone 10 mg take  4 each am x 2 days,   2 each am x 2 days,  1 each am x2days and stop  Prilosec Take 30-60 min before first meal of the day  GERD diet   03/18/2011 f/u ov/Jazzmen Restivo cc  No change doe, cough almost resolved, really thinks dulera helping rec No change rx  07/26/2012 f/u ov/Whitnee Orzel re GOLD III COPD on dulera and spiriva  Chief Complaint  Patient presents with  . Follow-up    Pt states breathing is doing well, no changes since the last visit. Denies any new co's.    rare saba, not limited by breathing but not doing many inclines rec Ok to try off spiriva > did not do it (forgot)    10/31/2012 f/u ov/Itzelle Gains confused with meds  Chief Complaint  Patient presents with   . Follow-up    Pt states breathing doing well, no new co's today.  "allergies" x 2 weeks > increase cough and nasal congestion but rare perceived need for albuterol  Mucus is scant and white rec med calendar     02/20/2013 f/u ov/Bocephus Cali re: GOLD III copd  Chief Complaint  Patient presents with  . Follow-up    Pt states her breathing is doing well. No new co's today.   not limited by breathing at all on spiriva and dulera with no need for saba at all Very confused with instructions, did not bring med calendar  Only new problem is very dry mouth ? From spiriva rx    No obvious daytime variabilty or assoc cough   cp or chest tightness, subjective wheeze overt sinus or hb symptoms. No unusual exp hx or h/o childhood pna/ asthma or premature birth to her knowledge.   Sleeping ok without nocturnal  or early am exacerbation  of respiratory  c/o's or need for noct saba. Also denies any obvious fluctuation of symptoms with weather or environmental changes or other aggravating or alleviating factors except as outlined above   Current Medications, Allergies, Past Medical History, Past Surgical History, Family History, and Social History were reviewed in [employer] [facility]  electronic medical record.  ROS  The following are not active complaints unless bolded sore throat, dysphagia, dental problems, itching, sneezing,  nasal congestion or excess/ purulent secretions, ear ache,   fever, chills, sweats, unintended wt loss, pleuritic or exertional cp, hemoptysis,  orthopnea pnd or leg swelling, presyncope, palpitations, heartburn, abdominal pain, anorexia, nausea, vomiting, diarrhea  or change in bowel or urinary habits, change in stools or urine, dysuria,hematuria,  rash, arthralgias, visual complaints, headache, numbness weakness or ataxia or problems with walking or coordination,  change in mood/affect or memory.           Past Medical History:  Allergic rhinitis  Asthma  Diabetes mellitus, type  II with neuropathy  GERD  HX GASTROPARESIS  DIVERTICULOSIS  Anxiety  Depression  Breast cancer, hx of  Colonic polyps, hx of/ADENOMATOUS 2007  Hyperlipidemia  COPD  - PFT's 2/07 FEV1 .79  - PFT's 02/15/08 FEV1 .97 (45%) ratio 49, 11% better after Beta 2, DLCO 48%  Multiple Pulmonary Nodules  - CT 12/24/08 assoc with R renal mass     Past Surgical History:  Cholecystectomy--1975  Tonsillectomy/A--1953 adenoidectomy  Lumpectomy--right breast 1998 no positive nodes RT only  Lung carcinoid removed (2)--1994 [facility]        Objective:   Physical Exam  amb wf nad   wt   146 02/15/08 >152 May 20, 2008  > 01/01/2011  142 > 142 01/15/2011 > 03/18/2011  141> 138 07/26/2012  > 141 10/31/2012  > 02/20/2013  141 HEENT   mild turbinate edema. Oropharynx no thrush or excess pnd or cobblestoning. No JVD or cervical adenopathy. Mild accessory muscle hypertrophy. Trachea midline, nl thryroid. Chest was hyperinflated by percussion with diminished breath sounds and moderate increased exp time with mostly upper airway insp and exp wheeze. Hoover sign positive at mid inspiration. Regular rate and rhythm without murmur gallop or rub or increase P2. No edema Abd: no hsm, nl excursion. Ext warm without cyanosis or clubbing      CXR  10/31/2012  Stable multiple small nodularities throughout bilateral lungs. This is compatible previously described chronic atypical infection such as MAI. No focal pneumonia. COPD   CT with contrast 11/22/12 ordered by Dr Zenaida Niece Tright 1. Relatively stable multiple noncalcified lung nodules diffusely  throughout the lungs most consistent with atypical infection such  as Mycobacterium avium complex.  2. New parenchymal opacity in the right middle lobe with air  bronchograms and some bronchiectatic change most consistent with an  atypical process such as Mycobacterium avium complex as well,  possibly active.  3. Somewhat spiculated nodule in the right upper lobe warrants   further follow-up to assess stability. Recommend follow-up CT  chest in 1 year.   Assessment:

## 2013-02-20 NOTE — Patient Instructions (Addendum)
Try off spiriva to see what difference if any it makes in your breathing - if you really feel you need it, return to office with accurate and updated med calendar for trial of tudorza (alternative is rx per Dr Alphonsus Sias)   If you are satisfied with your treatment plan let your doctor know and he/she can either refill your medications or you can return here when your prescription runs out.     If in any way you are not 100% satisfied,  please tell us.  If 100% better, tell your friends!  Late add: try tudorza if doesn't do as well off spiriva as mouth drying is limiting rx

## 2013-02-21 NOTE — Assessment & Plan Note (Signed)
-   PFT's 04/2005 FEV1 .79  - PFT's 02/15/08 FEV1 .97 (45%) ratio 49, 11% better after Beta 2, DLCO 48% - PFT's 03/18/2011  FEV1 0.97 (47%) ratio 52 and no change p B2,  DLCO 37 corrects to 72% - HFA 90% 10/31/2012 p extensive coaching - 01/15/2011  Walked RA x 1 laps @ 185 ft each stopped due to  desat to 88 p 1 lap> declined 02  -Med calendar 11/27/2012 > pt not following 02/20/13   Ok to try off spiriva due to dry mouth, if breathing worse try tudorza     Each maintenance medication was reviewed in detail including most importantly the difference between maintenance and as needed and under what circumstances the prns are to be used. This was done in the context of a medication calendar review which provided the patient with a user-friendly unambiguous mechanism for medication administration and reconciliation and provides an action plan for all active problems. It is critical that this be shown to every doctor  for modification during the office visit if necessary so the patient can use it as a working document.

## 2013-02-21 NOTE — Assessment & Plan Note (Signed)
-   CT Chest 10/27/10 Diffuse peribronchovascular nodularity with minimal  bronchiectasis, unchanged from baseline examination of 07/18/2009.  Differential diagnosis includes Mycobacterium avium complex (MAC)  and sarcoid.  2. Pulmonary arterial hypertension - associated with RML subsegmental atx 07/26/12 > f/u cxr 10/31/2012 > no change CT chest 11/22/12 per P Van Tright  1. Relatively stable multiple noncalcified lung nodules diffusely  throughout the lungs most consistent with atypical infection such  as Mycobacterium avium complex.  2. New parenchymal opacity in the right middle lobe with air  bronchograms and some bronchiectatic change most consistent with an  atypical process such as Mycobacterium avium complex as well,  possibly active.  3. Somewhat spiculated nodule in the right upper lobe warrants  further follow-up to assess stability. Recommend follow-up CT  chest in 1 year.   She probably does have low grade MAI but this is only causing microscopic changes and she has major macroscopic issues I would only do f/u cxr's, not ct's, in this setting and unless there are obvious new changes on plain cxr no change in rx needed.   Pulmonary f/u prn as pt does not appear to be able to process instructions from multiple providers

## 2013-03-14 ENCOUNTER — Encounter: Payer: Self-pay | Admitting: Internal Medicine

## 2013-03-14 ENCOUNTER — Ambulatory Visit (INDEPENDENT_AMBULATORY_CARE_PROVIDER_SITE_OTHER): Payer: Medicare Other | Admitting: Internal Medicine

## 2013-03-14 VITALS — BP 110/60 | HR 80 | Temp 98.4°F | Ht 63.0 in | Wt 144.5 lb

## 2013-03-14 DIAGNOSIS — J449 Chronic obstructive pulmonary disease, unspecified: Secondary | ICD-10-CM

## 2013-03-14 DIAGNOSIS — F329 Major depressive disorder, single episode, unspecified: Secondary | ICD-10-CM

## 2013-03-14 DIAGNOSIS — F3289 Other specified depressive episodes: Secondary | ICD-10-CM | POA: Diagnosis not present

## 2013-03-14 DIAGNOSIS — E1149 Type 2 diabetes mellitus with other diabetic neurological complication: Secondary | ICD-10-CM

## 2013-03-14 DIAGNOSIS — E785 Hyperlipidemia, unspecified: Secondary | ICD-10-CM

## 2013-03-14 DIAGNOSIS — Z0000 Encounter for general adult medical examination without abnormal findings: Secondary | ICD-10-CM

## 2013-03-14 DIAGNOSIS — E1142 Type 2 diabetes mellitus with diabetic polyneuropathy: Secondary | ICD-10-CM | POA: Diagnosis not present

## 2013-03-14 DIAGNOSIS — G3184 Mild cognitive impairment, so stated: Secondary | ICD-10-CM

## 2013-03-14 LAB — CBC WITH DIFFERENTIAL/PLATELET
BASOS ABS: 0 10*3/uL (ref 0.0–0.1)
Basophils Relative: 0.3 % (ref 0.0–3.0)
EOS ABS: 0.2 10*3/uL (ref 0.0–0.7)
Eosinophils Relative: 2 % (ref 0.0–5.0)
HCT: 37.5 % (ref 36.0–46.0)
HEMOGLOBIN: 12.4 g/dL (ref 12.0–15.0)
LYMPHS PCT: 24.5 % (ref 12.0–46.0)
Lymphs Abs: 2.4 10*3/uL (ref 0.7–4.0)
MCHC: 33.1 g/dL (ref 30.0–36.0)
MCV: 91.1 fl (ref 78.0–100.0)
Monocytes Absolute: 0.5 10*3/uL (ref 0.1–1.0)
Monocytes Relative: 5.5 % (ref 3.0–12.0)
NEUTROS ABS: 6.7 10*3/uL (ref 1.4–7.7)
Neutrophils Relative %: 67.7 % (ref 43.0–77.0)
PLATELETS: 213 10*3/uL (ref 150.0–400.0)
RBC: 4.12 Mil/uL (ref 3.87–5.11)
RDW: 14.3 % (ref 11.5–14.6)
WBC: 9.9 10*3/uL (ref 4.5–10.5)

## 2013-03-14 LAB — LIPID PANEL
CHOLESTEROL: 156 mg/dL (ref 0–200)
HDL: 54.1 mg/dL (ref >39.00)
LDL CALC: 83 mg/dL (ref 0–99)
TRIGLYCERIDES: 95 mg/dL (ref 0.0–149.0)
Total CHOL/HDL Ratio: 3
VLDL: 19 mg/dL (ref 0.0–40.0)

## 2013-03-14 LAB — HEPATIC FUNCTION PANEL
ALBUMIN: 3.8 g/dL (ref 3.5–5.2)
ALT: 28 U/L (ref 0–35)
AST: 23 U/L (ref 0–37)
Alkaline Phosphatase: 50 U/L (ref 39–117)
Bilirubin, Direct: 0 mg/dL (ref 0.0–0.3)
Total Bilirubin: 0.5 mg/dL (ref 0.3–1.2)
Total Protein: 6.4 g/dL (ref 6.0–8.3)

## 2013-03-14 LAB — HM DIABETES FOOT EXAM

## 2013-03-14 LAB — BASIC METABOLIC PANEL
BUN: 17 mg/dL (ref 6–23)
CALCIUM: 9.4 mg/dL (ref 8.4–10.5)
CO2: 29 meq/L (ref 19–32)
Chloride: 107 mEq/L (ref 96–112)
Creatinine, Ser: 1.2 mg/dL (ref 0.4–1.2)
GFR: 47.26 mL/min — AB (ref >60.00)
GLUCOSE: 142 mg/dL — AB (ref 70–99)
Potassium: 4.5 mEq/L (ref 3.5–5.1)
SODIUM: 143 meq/L (ref 135–145)

## 2013-03-14 LAB — HEMOGLOBIN A1C: HEMOGLOBIN A1C: 7.1 % — AB (ref 4.6–6.5)

## 2013-03-14 LAB — T4, FREE: Free T4: 0.99 ng/dL (ref 0.60–1.60)

## 2013-03-14 LAB — TSH: TSH: 0.39 u[IU]/mL (ref 0.35–5.50)

## 2013-03-14 MED ORDER — TIOTROPIUM BROMIDE MONOHYDRATE 18 MCG IN CAPS: 18.0000 ug | ORAL_CAPSULE | Freq: Every day | RESPIRATORY_TRACT | Status: DC

## 2013-03-14 NOTE — Assessment & Plan Note (Signed)
Controlled with the med Will continue

## 2013-03-14 NOTE — Progress Notes (Signed)
Pre-visit discussion using our clinic review tool. No additional management support is needed unless otherwise documented below in the visit note.  

## 2013-03-14 NOTE — Assessment & Plan Note (Signed)
Will check labs back on the statin

## 2013-03-14 NOTE — Assessment & Plan Note (Signed)
I have personally reviewed the Medicare Annual Wellness questionnaire and have noted 1. The patient's medical and social history 2. Their use of alcohol, tobacco or illicit drugs 3. Their current medications and supplements 4. The patient's functional ability including ADL's, fall risks, home safety risks and hearing or visual             impairment. 5. Diet and physical activities 6. Evidence for depression or mood disorders  The patients weight, height, BMI and visual acuity have been recorded in the chart I have made referrals, counseling and provided education to the patient based review of the above and I have provided the pt with a written personalized care plan for preventive services.  I have provided you with a copy of your personalized plan for preventive services. Please take the time to review along with your updated medication list.  Just got prevnar 13 UTD otherwise

## 2013-03-14 NOTE — Progress Notes (Signed)
Subjective:    Patient ID: [name], female    DOB: [birth year], 68 y.o.   MRN: [id number]  HPI Here for Medicare wellness and follow up Off tobacco-- no alcohol Reviewed advanced directives Has had a couple of falls--tripped. No injuries Mood has been good on the medication. Only on the one med now Reviewed other doctors Independent in instrumental ADLs. Still drives  No changes in cognition Husband confirms stability No apraxia  Checks sugars every couple of weeks Usually under 110 No hypoglycemic reactions UTD on eye exam Neuropathic pain controlled with lyrica  Breathing has been okay Noticed it worsened off the spiriva Now takes intermittently-- 3 days per week No problems with dry mouth on reduced schedule  Stomach has been okay Uses the omeprazole only prn No swallowing problems  Current Outpatient Prescriptions on File Prior to Visit  Medication Sig Dispense Refill  . albuterol (PROAIR HFA) 108 (90 BASE) MCG/ACT inhaler Inhale 2 puffs into the lungs every 6 (six) hours as needed for wheezing or shortness of breath.      Marland Kitchen atorvastatin (LIPITOR) 40 MG tablet Take 1 tablet (40 mg total) by mouth daily.  90 tablet  2  . Cholecalciferol (VITAMIN D3) 5000 UNITS CAPS Take 1 capsule by mouth every morning.      . citalopram (CELEXA) 20 MG tablet Take 20 mg by mouth daily. 1/2 tab by mouth every morning      . fexofenadine (ALLEGRA) 180 MG tablet Take 180 mg by mouth daily as needed (nasal drainage).      . fluticasone (FLONASE) 50 MCG/ACT nasal spray USE 2 SPRAYS NASALLY DAILY  48 g  0  . irbesartan (AVAPRO) 150 MG tablet Take 150 mg by mouth daily.      Marland Kitchen L-Methylfolate-Algae-B12-B6 (METANX) 3-90.314-2-35 MG CAPS Take 180 mg by mouth 2 (two) times daily.      Marland Kitchen LYRICA 75 MG capsule Take 75 mg by mouth 3 (three) times daily.       . metformin (FORTAMET) 1000 MG (OSM) 24 hr tablet TAKE 1 TABLET TWICE A DAY  180 tablet  3  . mometasone-formoterol (DULERA) 200-5  MCG/ACT AERO Take 2 puffs first thing in am and then another 2 puffs about 12 hours later.  39 g  3  . Multiple Vitamin (MULTIVITAMIN) tablet Take 1 tablet by mouth every morning.       . Omega-3 Fatty Acids (FISH OIL) 1200 MG CAPS Take 1 capsule by mouth every morning. (place in the freezer)      . omeprazole (PRILOSEC) 20 MG capsule Take 1 capsule (20 mg total) by mouth daily as needed.  90 capsule  3  . vitamin C (ASCORBIC ACID) 500 MG tablet Take 500 mg by mouth every morning.      . Cyanocobalamin (VITAMIN B-12) 2500 MCG SUBL Place 1 tablet under the tongue every morning.      . famotidine (PEPCID) 20 MG tablet Take 20 mg by mouth at bedtime as needed (for flare of cough).       No current facility-administered medications on file prior to visit.    Allergies  Allergen Reactions  . Budesonide-Formoterol Fumarate Other (See Comments)    REACTION: coughed worse  . Penicillins Rash    REACTION: rash    Past Medical History  Diagnosis Date  . Renal cell carcinoma   . Asthma   . Diabetes mellitus type II   . GERD (gastroesophageal reflux disease)   . Gastroparesis   .  Diverticulosis   . Anxiety   . Depression   . History of breast cancer   . History of adenomatous polyp of colon   . Hyperlipidemia   . COPD (chronic obstructive pulmonary disease)   . Pulmonary nodule     multiple  . Osteoarthritis   . Carcinoid tumor of lung 1991  . Mild cognitive impairment     Past Surgical History  Procedure Laterality Date  . Cholecystectomy  1975  . Tonsillectomy and adenoidectomy  1953  . Breast lumpectomy  1998    right breast  . Lung surgery  1994    carcinoid removal  . Laparoscopic nephrectomy  01/2009    right    Family History  Problem Relation Age of Onset  . Colon cancer Father   . Lung cancer Father   . Stroke Mother     History   Social History  . Marital Status: Married    Spouse Name: N/A    Number of Children: 2  . Years of Education: N/A    Occupational History  . retired Science writer    Social History Main Topics  . Smoking status: Former Smoker -- 1.00 packs/day for 30 years    Types: Cigarettes    Quit date: 03/08/1993  . Smokeless tobacco: Never Used  . Alcohol Use: No  . Drug Use: No  . Sexual Activity: Not on file   Other Topics Concern  . Not on file   Social History Narrative   No living will   Requests husband as health care POA   Would accept attempts at resuscitation   No tube feeds if cognitively unaware            Review of Systems Weight up 15# in past year--relates to lyrica Sleeps okay Chronic constipation---miralax helps No urinary problems. No incontinence    Objective:   Physical Exam  Constitutional: She appears well-developed and well-nourished. No distress.  HENT:  Mouth/Throat: Oropharynx is clear and moist. No oropharyngeal exudate.  Neck: Normal range of motion. Neck supple. No thyromegaly present.  Cardiovascular: Normal rate, regular rhythm, normal heart sounds and intact distal pulses.  Exam reveals no gallop.   No murmur heard. Pulmonary/Chest: Effort normal. No respiratory distress. She has no wheezes. She has no rales.  Decreased breath sounds but clear  Abdominal: Soft. There is no tenderness.  Musculoskeletal: She exhibits no edema and no tenderness.  Lymphadenopathy:    She has no cervical adenopathy.  Skin: No rash noted. No erythema.  No foot lesions  Psychiatric: She has a normal mood and affect. Her behavior is normal.          Assessment & Plan:

## 2013-03-14 NOTE — Patient Instructions (Signed)

## 2013-03-14 NOTE — Assessment & Plan Note (Signed)
Still sees Dr Wilson Singer

## 2013-03-14 NOTE — Assessment & Plan Note (Signed)
Stable No Rx for now

## 2013-03-14 NOTE — Assessment & Plan Note (Signed)
Doing okay Sees Dr Melvyn Novas Mouth better on reduced spiriva

## 2013-03-14 NOTE — Assessment & Plan Note (Signed)
Controlled with lyrica but concerned about weight gain Will discuss with Dr Wilson Singer

## 2013-03-15 ENCOUNTER — Telehealth: Payer: Self-pay

## 2013-03-15 ENCOUNTER — Telehealth: Payer: Self-pay | Admitting: Internal Medicine

## 2013-03-15 NOTE — Telephone Encounter (Signed)
Relevant patient education assigned to patient using Emmi. ° °

## 2013-03-20 DIAGNOSIS — H2510 Age-related nuclear cataract, unspecified eye: Secondary | ICD-10-CM | POA: Diagnosis not present

## 2013-03-30 ENCOUNTER — Other Ambulatory Visit: Payer: Self-pay

## 2013-03-30 MED ORDER — FLUTICASONE PROPIONATE 50 MCG/ACT NA SUSP: NASAL | Status: DC

## 2013-03-30 NOTE — Telephone Encounter (Signed)
Pt request refill flonase to [facility]; advised pt done.

## 2013-05-02 DIAGNOSIS — E119 Type 2 diabetes mellitus without complications: Secondary | ICD-10-CM | POA: Diagnosis not present

## 2013-05-16 DIAGNOSIS — G609 Hereditary and idiopathic neuropathy, unspecified: Secondary | ICD-10-CM | POA: Diagnosis not present

## 2013-05-16 DIAGNOSIS — E119 Type 2 diabetes mellitus without complications: Secondary | ICD-10-CM | POA: Diagnosis not present

## 2013-05-16 DIAGNOSIS — R0609 Other forms of dyspnea: Secondary | ICD-10-CM | POA: Diagnosis not present

## 2013-05-16 DIAGNOSIS — R0989 Other specified symptoms and signs involving the circulatory and respiratory systems: Secondary | ICD-10-CM | POA: Diagnosis not present

## 2013-05-21 DIAGNOSIS — J018 Other acute sinusitis: Secondary | ICD-10-CM | POA: Diagnosis not present

## 2013-05-22 ENCOUNTER — Telehealth: Payer: Self-pay | Admitting: Internal Medicine

## 2013-05-22 MED ORDER — MOMETASONE FURO-FORMOTEROL FUM 200-5 MCG/ACT IN AERO: INHALATION_SPRAY | RESPIRATORY_TRACT | Status: DC

## 2013-05-22 NOTE — Telephone Encounter (Signed)
Spoke with pt. She is aware refill for dulera has been sent. Nothing further needed

## 2013-05-28 ENCOUNTER — Other Ambulatory Visit: Payer: Self-pay | Admitting: Internal Medicine

## 2013-06-15 ENCOUNTER — Ambulatory Visit (INDEPENDENT_AMBULATORY_CARE_PROVIDER_SITE_OTHER): Payer: Medicare Other | Admitting: Internal Medicine

## 2013-06-15 ENCOUNTER — Encounter: Payer: Self-pay | Admitting: Internal Medicine

## 2013-06-15 VITALS — BP 110/60 | HR 87 | Temp 97.5°F | Wt 142.0 lb

## 2013-06-15 DIAGNOSIS — J309 Allergic rhinitis, unspecified: Secondary | ICD-10-CM | POA: Diagnosis not present

## 2013-06-15 MED ORDER — MONTELUKAST SODIUM 10 MG PO TABS: 10.0000 mg | ORAL_TABLET | Freq: Every day | ORAL | Status: DC

## 2013-06-15 NOTE — Assessment & Plan Note (Signed)
Seems like symptoms are mostly allergic---with the eye and nasal symptoms Certainly nothing to suggest bacterial sinus infection at this point No COPD involvement Not sure why the GI stuff---exam is negative---unless swallowing secretions  Will try adding montelukast Consider clinda next week if worsens

## 2013-06-15 NOTE — Progress Notes (Signed)
Subjective:    Patient ID: Shivon Hackel, female    DOB: [birth year], 68 y.o.   MRN: [id number]  HPI Here with husband  Having watering in eyes, heartburn, headache, stuffy nose, sore throat Feels achy all over Some nausea Just started a couple of days ago Some spring allergies in the past---but this is the worst  Not much cough Not SOB Not wheezing or tight  Tried mucinex, allegra D, allegra for a week or more. Not clearly helpful Nausea is all the time. Appetite is off  Current Outpatient Prescriptions on File Prior to Visit  Medication Sig Dispense Refill  . albuterol (PROAIR HFA) 108 (90 BASE) MCG/ACT inhaler Inhale 2 puffs into the lungs every 6 (six) hours as needed for wheezing or shortness of breath.      . Cholecalciferol (VITAMIN D3) 5000 UNITS CAPS Take 1 capsule by mouth every morning.      . citalopram (CELEXA) 20 MG tablet TAKE 1 TABLET DAILY OR AS  DIRECTED  90 tablet  0  . famotidine (PEPCID) 20 MG tablet Take 20 mg by mouth at bedtime as needed (for flare of cough).      . fexofenadine (ALLEGRA) 180 MG tablet Take 180 mg by mouth daily as needed (nasal drainage).      . fluticasone (FLONASE) 50 MCG/ACT nasal spray USE 2 SPRAYS NASALLY DAILY  48 g  1  . irbesartan (AVAPRO) 150 MG tablet Take 150 mg by mouth daily.      Marland Kitchen L-Methylfolate-Algae-B12-B6 (METANX) 3-90.314-2-35 MG CAPS Take 180 mg by mouth 2 (two) times daily.      Marland Kitchen LYRICA 75 MG capsule Take 75 mg by mouth 3 (three) times daily.       . metformin (FORTAMET) 1000 MG (OSM) 24 hr tablet TAKE 1 TABLET TWICE A DAY  180 tablet  3  . mometasone-formoterol (DULERA) 200-5 MCG/ACT AERO Take 2 puffs first thing in am and then another 2 puffs about 12 hours later.  3 Inhaler  3  . Multiple Vitamin (MULTIVITAMIN) tablet Take 1 tablet by mouth every morning.       . Omega-3 Fatty Acids (FISH OIL) 1200 MG CAPS Take 1 capsule by mouth every morning. (place in the freezer)      . omeprazole (PRILOSEC) 20 MG capsule  Take 1 capsule (20 mg total) by mouth daily as needed.  90 capsule  3  . tiotropium (SPIRIVA) 18 MCG inhalation capsule Place 1 capsule (18 mcg total) into inhaler and inhale daily.  90 capsule  3  . vitamin C (ASCORBIC ACID) 500 MG tablet Take 500 mg by mouth every morning.       No current facility-administered medications on file prior to visit.    Allergies  Allergen Reactions  . Budesonide-Formoterol Fumarate Other (See Comments)    REACTION: coughed worse  . Penicillins Rash    REACTION: rash    Past Medical History  Diagnosis Date  . Renal cell carcinoma   . Asthma   . Diabetes mellitus type II   . GERD (gastroesophageal reflux disease)   . Gastroparesis   . Diverticulosis   . Anxiety   . Depression   . History of breast cancer   . History of adenomatous polyp of colon   . Hyperlipidemia   . COPD (chronic obstructive pulmonary disease)   . Pulmonary nodule     multiple  . Osteoarthritis   . Carcinoid tumor of lung 1991  . Mild cognitive impairment  Past Surgical History  Procedure Laterality Date  . Cholecystectomy  1975  . Tonsillectomy and adenoidectomy  1953  . Breast lumpectomy  1998    right breast  . Lung surgery  1994    carcinoid removal  . Laparoscopic nephrectomy  01/2009    right    Family History  Problem Relation Age of Onset  . Colon cancer Father   . Lung cancer Father   . Stroke Mother     History   Social History  . Marital Status: Married    Spouse Name: N/A    Number of Children: 2  . Years of Education: N/A   Occupational History  . retired Science writer    Social History Main Topics  . Smoking status: Former Smoker -- 1.00 packs/day for 30 years    Types: Cigarettes    Quit date: 03/08/1993  . Smokeless tobacco: Never Used  . Alcohol Use: No  . Drug Use: No  . Sexual Activity: Not on file   Other Topics Concern  . Not on file   Social History Narrative   No living will   Requests husband as health care POA    Would accept attempts at resuscitation   No tube feeds if cognitively unaware            Review of Systems Treated for sinus infection by Dr Tami Ribas several weeks ago    Objective:   Physical Exam  Constitutional: She appears well-developed and well-nourished. No distress.  HENT:  Mouth/Throat: Oropharynx is clear and moist. No oropharyngeal exudate.  No sinus tenderness Nose not inflamed--mild pale congestion at most  Neck: Normal range of motion. Neck supple. No thyromegaly present.  Pulmonary/Chest: Effort normal and breath sounds normal. No respiratory distress. She has no wheezes. She has no rales.  Lymphadenopathy:    She has no cervical adenopathy.          Assessment & Plan:

## 2013-06-15 NOTE — Patient Instructions (Signed)
Stop the mucinex Continue the allegra every day. Add the daily montelukast (singulair) Call next week if you are worsening

## 2013-06-15 NOTE — Progress Notes (Signed)
Pre visit review using our clinic review tool, if applicable. No additional management support is needed unless otherwise documented below in the visit note. 

## 2013-06-26 ENCOUNTER — Other Ambulatory Visit: Payer: Self-pay | Admitting: Internal Medicine

## 2013-06-26 DIAGNOSIS — R059 Cough, unspecified: Secondary | ICD-10-CM | POA: Diagnosis not present

## 2013-06-26 DIAGNOSIS — R05 Cough: Secondary | ICD-10-CM | POA: Diagnosis not present

## 2013-06-26 DIAGNOSIS — J018 Other acute sinusitis: Secondary | ICD-10-CM | POA: Diagnosis not present

## 2013-06-26 DIAGNOSIS — J209 Acute bronchitis, unspecified: Secondary | ICD-10-CM | POA: Diagnosis not present

## 2013-06-26 DIAGNOSIS — R921 Mammographic calcification found on diagnostic imaging of breast: Secondary | ICD-10-CM

## 2013-07-03 DIAGNOSIS — M25519 Pain in unspecified shoulder: Secondary | ICD-10-CM | POA: Diagnosis not present

## 2013-07-03 DIAGNOSIS — M999 Biomechanical lesion, unspecified: Secondary | ICD-10-CM | POA: Diagnosis not present

## 2013-07-03 DIAGNOSIS — M542 Cervicalgia: Secondary | ICD-10-CM | POA: Diagnosis not present

## 2013-07-03 DIAGNOSIS — M5030 Other cervical disc degeneration, unspecified cervical region: Secondary | ICD-10-CM | POA: Diagnosis not present

## 2013-07-03 DIAGNOSIS — M549 Dorsalgia, unspecified: Secondary | ICD-10-CM | POA: Diagnosis not present

## 2013-07-03 DIAGNOSIS — M9981 Other biomechanical lesions of cervical region: Secondary | ICD-10-CM | POA: Diagnosis not present

## 2013-07-03 DIAGNOSIS — IMO0001 Reserved for inherently not codable concepts without codable children: Secondary | ICD-10-CM | POA: Diagnosis not present

## 2013-07-03 DIAGNOSIS — M5380 Other specified dorsopathies, site unspecified: Secondary | ICD-10-CM | POA: Diagnosis not present

## 2013-07-04 DIAGNOSIS — M5380 Other specified dorsopathies, site unspecified: Secondary | ICD-10-CM | POA: Diagnosis not present

## 2013-07-04 DIAGNOSIS — M542 Cervicalgia: Secondary | ICD-10-CM | POA: Diagnosis not present

## 2013-07-04 DIAGNOSIS — M9981 Other biomechanical lesions of cervical region: Secondary | ICD-10-CM | POA: Diagnosis not present

## 2013-07-04 DIAGNOSIS — M25519 Pain in unspecified shoulder: Secondary | ICD-10-CM | POA: Diagnosis not present

## 2013-07-04 DIAGNOSIS — M549 Dorsalgia, unspecified: Secondary | ICD-10-CM | POA: Diagnosis not present

## 2013-07-04 DIAGNOSIS — M5030 Other cervical disc degeneration, unspecified cervical region: Secondary | ICD-10-CM | POA: Diagnosis not present

## 2013-07-04 DIAGNOSIS — IMO0001 Reserved for inherently not codable concepts without codable children: Secondary | ICD-10-CM | POA: Diagnosis not present

## 2013-07-04 DIAGNOSIS — M999 Biomechanical lesion, unspecified: Secondary | ICD-10-CM | POA: Diagnosis not present

## 2013-07-05 DIAGNOSIS — M549 Dorsalgia, unspecified: Secondary | ICD-10-CM | POA: Diagnosis not present

## 2013-07-05 DIAGNOSIS — M5030 Other cervical disc degeneration, unspecified cervical region: Secondary | ICD-10-CM | POA: Diagnosis not present

## 2013-07-05 DIAGNOSIS — M999 Biomechanical lesion, unspecified: Secondary | ICD-10-CM | POA: Diagnosis not present

## 2013-07-05 DIAGNOSIS — M9981 Other biomechanical lesions of cervical region: Secondary | ICD-10-CM | POA: Diagnosis not present

## 2013-07-05 DIAGNOSIS — M25519 Pain in unspecified shoulder: Secondary | ICD-10-CM | POA: Diagnosis not present

## 2013-07-05 DIAGNOSIS — M542 Cervicalgia: Secondary | ICD-10-CM | POA: Diagnosis not present

## 2013-07-05 DIAGNOSIS — IMO0001 Reserved for inherently not codable concepts without codable children: Secondary | ICD-10-CM | POA: Diagnosis not present

## 2013-07-05 DIAGNOSIS — M5380 Other specified dorsopathies, site unspecified: Secondary | ICD-10-CM | POA: Diagnosis not present

## 2013-07-10 DIAGNOSIS — M5030 Other cervical disc degeneration, unspecified cervical region: Secondary | ICD-10-CM | POA: Diagnosis not present

## 2013-07-10 DIAGNOSIS — M25519 Pain in unspecified shoulder: Secondary | ICD-10-CM | POA: Diagnosis not present

## 2013-07-10 DIAGNOSIS — M9981 Other biomechanical lesions of cervical region: Secondary | ICD-10-CM | POA: Diagnosis not present

## 2013-07-10 DIAGNOSIS — IMO0001 Reserved for inherently not codable concepts without codable children: Secondary | ICD-10-CM | POA: Diagnosis not present

## 2013-07-10 DIAGNOSIS — M999 Biomechanical lesion, unspecified: Secondary | ICD-10-CM | POA: Diagnosis not present

## 2013-07-10 DIAGNOSIS — M549 Dorsalgia, unspecified: Secondary | ICD-10-CM | POA: Diagnosis not present

## 2013-07-10 DIAGNOSIS — M542 Cervicalgia: Secondary | ICD-10-CM | POA: Diagnosis not present

## 2013-07-10 DIAGNOSIS — M5380 Other specified dorsopathies, site unspecified: Secondary | ICD-10-CM | POA: Diagnosis not present

## 2013-07-16 DIAGNOSIS — M549 Dorsalgia, unspecified: Secondary | ICD-10-CM | POA: Diagnosis not present

## 2013-07-16 DIAGNOSIS — M542 Cervicalgia: Secondary | ICD-10-CM | POA: Diagnosis not present

## 2013-07-16 DIAGNOSIS — M25519 Pain in unspecified shoulder: Secondary | ICD-10-CM | POA: Diagnosis not present

## 2013-07-16 DIAGNOSIS — M9981 Other biomechanical lesions of cervical region: Secondary | ICD-10-CM | POA: Diagnosis not present

## 2013-07-16 DIAGNOSIS — M5030 Other cervical disc degeneration, unspecified cervical region: Secondary | ICD-10-CM | POA: Diagnosis not present

## 2013-07-16 DIAGNOSIS — M999 Biomechanical lesion, unspecified: Secondary | ICD-10-CM | POA: Diagnosis not present

## 2013-07-16 DIAGNOSIS — IMO0001 Reserved for inherently not codable concepts without codable children: Secondary | ICD-10-CM | POA: Diagnosis not present

## 2013-07-16 DIAGNOSIS — M5380 Other specified dorsopathies, site unspecified: Secondary | ICD-10-CM | POA: Diagnosis not present

## 2013-07-17 DIAGNOSIS — M9981 Other biomechanical lesions of cervical region: Secondary | ICD-10-CM | POA: Diagnosis not present

## 2013-07-17 DIAGNOSIS — M549 Dorsalgia, unspecified: Secondary | ICD-10-CM | POA: Diagnosis not present

## 2013-07-17 DIAGNOSIS — M5030 Other cervical disc degeneration, unspecified cervical region: Secondary | ICD-10-CM | POA: Diagnosis not present

## 2013-07-17 DIAGNOSIS — IMO0001 Reserved for inherently not codable concepts without codable children: Secondary | ICD-10-CM | POA: Diagnosis not present

## 2013-07-17 DIAGNOSIS — M5380 Other specified dorsopathies, site unspecified: Secondary | ICD-10-CM | POA: Diagnosis not present

## 2013-07-17 DIAGNOSIS — M25519 Pain in unspecified shoulder: Secondary | ICD-10-CM | POA: Diagnosis not present

## 2013-07-17 DIAGNOSIS — M999 Biomechanical lesion, unspecified: Secondary | ICD-10-CM | POA: Diagnosis not present

## 2013-07-17 DIAGNOSIS — M542 Cervicalgia: Secondary | ICD-10-CM | POA: Diagnosis not present

## 2013-07-19 ENCOUNTER — Telehealth: Payer: Self-pay | Admitting: Internal Medicine

## 2013-07-19 NOTE — Telephone Encounter (Signed)
Spoke with patient and she has been having nausea and bloating for several days and would like to be seen. Scheduled with Alonza Bogus, PA on 07/20/13 at 2:30 PM.

## 2013-07-20 ENCOUNTER — Ambulatory Visit (INDEPENDENT_AMBULATORY_CARE_PROVIDER_SITE_OTHER): Payer: Medicare Other | Admitting: Gastroenterology

## 2013-07-20 ENCOUNTER — Encounter: Payer: Self-pay | Admitting: Gastroenterology

## 2013-07-20 VITALS — BP 108/64 | HR 66 | Ht 64.0 in | Wt 138.0 lb

## 2013-07-20 DIAGNOSIS — K3184 Gastroparesis: Secondary | ICD-10-CM | POA: Diagnosis not present

## 2013-07-20 DIAGNOSIS — R1013 Epigastric pain: Secondary | ICD-10-CM | POA: Diagnosis not present

## 2013-07-20 DIAGNOSIS — R110 Nausea: Secondary | ICD-10-CM | POA: Diagnosis not present

## 2013-07-20 DIAGNOSIS — K5900 Constipation, unspecified: Secondary | ICD-10-CM | POA: Diagnosis not present

## 2013-07-20 MED ORDER — METOCLOPRAMIDE HCL 5 MG PO TABS: 5.0000 mg | ORAL_TABLET | Freq: Three times a day (TID) | ORAL | Status: DC

## 2013-07-20 NOTE — Patient Instructions (Addendum)
We have sent the following medications to your pharmacy for you to pick up at your convenience: Reglan 5 mg to take one tablet by mouth four times a day before meals and at bedtime.  Start taking your Miralax on a regular basis every day.  You have been given a Gastroparesis diet.   Please follow up with Dr. Olevia Perches on 08/24/13 at 10:45am. If you need to reschedule or cancel please call [phone].

## 2013-07-20 NOTE — Progress Notes (Signed)
07/20/2013 [name] [id number] 31-Mar-1945   History of Present Illness:  This is a 68 year old female who is known to Dr. Olevia Perches for previous treatment of constipation and diabetic gastroparesis.  She was last seen in May 2012. Gastric emptying scan was positive in October 2006. She is currently only taking MiraLax on occasion for her constipation, and omeprazole 20 mg daily as needed and Pepcid 20 mg at bedtime as needed for reflux symptoms. She presents to our office today with her husband complaining of epigastric abdominal pain for the past 6 weeks that is constant but worse after meals.  She also has constant nausea but has not experienced any vomiting. She also feels bloated and has early satiety.  She apparently had been on Reglan in the past and her husband recalls that she had a good response to that medication, but thinks that they discontinued it due to fear of side effects. He states that she did not actually have any side effects from the medication. When I mentioned this medication he was strongly in favor of her trying it again. We also discussed the option of domperidone as well as placing her on daily, regular acid reflux/PPI therapy. She also complains of constipation but is only taking MiraLax for a couple days at a time as needed. The MiraLax does work for her, however, after taking a couple of doses.  Her last colonoscopy was in February 2013 at which time she was found to have an ulcer at the IC valve, but normal terminal ileum. She also 3 polyps removed and diverticulosis; some of the polyps were hyperplastic and some more tubular adenomas.  Biopsy of the terminal ileum was normal, but biopsy of the IC valve showed eroded ileocolonic mucosa with reactive epithelial atypia and prolapsed type injury. Her last EGD was in October 2006 at which time the study was normal.  Current Medications, Allergies, Past Medical History, Past Surgical History, Family History and Social History were  reviewed in [facility] record.   Physical Exam: BP 108/64  Pulse 66  Ht 5\' 4"  (1.626 m)  Wt 138 lb (62.596 kg)  BMI 23.68 kg/m2 General: Well developed white female in no acute distress Head: Normocephalic and atraumatic Eyes:  Sclerae anicteric, conjunctiva pink  Ears: Normal auditory acuity Lungs: Clear throughout to auscultation Heart: Regular rate and rhythm Abdomen: Soft, non-distended.  Previous open cholecystectomy scar noted.  Normal bowel sounds.  TTP in the epigastrium without R/R/G. Musculoskeletal: Symmetrical with no gross deformities  Extremities: No edema  Neurological: Alert oriented x 4, grossly non-focal Psychological:  Alert and cooperative. Normal mood and affect  Assessment and Recommendations: -Diabetic gastroparesis:  Long-standing diabetes, but blood sugars currently well-controlled. 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 She was on Reglan in the past with relief of symptoms but discontinued it for fear of side effects. She did not actually have side effects from the medication.  She will try Reglan 5 mg before meals and at bedtime; he husband was strongly in favor of trying this again since it seemed to help so much in the past. Obviously they are aware of the side effects and will discontinue the medication and call our office if any of those are to occur. I did speak with him about Domperidone and said that it could be an alternate medication to try if they decide to do so. Gastroparesis dietary measures reviewed and paperwork given. May need to consider taking regular, daily reflux/PPI therapy as well. May want to  consider repeat EGD depending on her symptomatic response. -Constipation:  She will begin taking MiraLax on a regular basis, he either every day or at least every other day.  *She will followup in 4-6 weeks with Dr. Olevia Perches.

## 2013-07-23 DIAGNOSIS — M5030 Other cervical disc degeneration, unspecified cervical region: Secondary | ICD-10-CM | POA: Diagnosis not present

## 2013-07-23 DIAGNOSIS — IMO0001 Reserved for inherently not codable concepts without codable children: Secondary | ICD-10-CM | POA: Diagnosis not present

## 2013-07-23 DIAGNOSIS — M542 Cervicalgia: Secondary | ICD-10-CM | POA: Diagnosis not present

## 2013-07-23 DIAGNOSIS — M25519 Pain in unspecified shoulder: Secondary | ICD-10-CM | POA: Diagnosis not present

## 2013-07-23 DIAGNOSIS — M5380 Other specified dorsopathies, site unspecified: Secondary | ICD-10-CM | POA: Diagnosis not present

## 2013-07-23 DIAGNOSIS — M999 Biomechanical lesion, unspecified: Secondary | ICD-10-CM | POA: Diagnosis not present

## 2013-07-23 DIAGNOSIS — M9981 Other biomechanical lesions of cervical region: Secondary | ICD-10-CM | POA: Diagnosis not present

## 2013-07-23 DIAGNOSIS — M549 Dorsalgia, unspecified: Secondary | ICD-10-CM | POA: Diagnosis not present

## 2013-07-23 NOTE — Progress Notes (Signed)
Reviewed and agree with low dose reglan,  Another option would be  Xifaxin or Flagyl to normalize bacterial flora.

## 2013-07-25 DIAGNOSIS — M542 Cervicalgia: Secondary | ICD-10-CM | POA: Diagnosis not present

## 2013-07-25 DIAGNOSIS — M5030 Other cervical disc degeneration, unspecified cervical region: Secondary | ICD-10-CM | POA: Diagnosis not present

## 2013-07-25 DIAGNOSIS — IMO0001 Reserved for inherently not codable concepts without codable children: Secondary | ICD-10-CM | POA: Diagnosis not present

## 2013-07-25 DIAGNOSIS — M25519 Pain in unspecified shoulder: Secondary | ICD-10-CM | POA: Diagnosis not present

## 2013-07-25 DIAGNOSIS — M9981 Other biomechanical lesions of cervical region: Secondary | ICD-10-CM | POA: Diagnosis not present

## 2013-07-25 DIAGNOSIS — M5380 Other specified dorsopathies, site unspecified: Secondary | ICD-10-CM | POA: Diagnosis not present

## 2013-07-25 DIAGNOSIS — M549 Dorsalgia, unspecified: Secondary | ICD-10-CM | POA: Diagnosis not present

## 2013-07-25 DIAGNOSIS — M999 Biomechanical lesion, unspecified: Secondary | ICD-10-CM | POA: Diagnosis not present

## 2013-08-01 ENCOUNTER — Ambulatory Visit
Admission: RE | Admit: 2013-08-01 | Discharge: 2013-08-01 | Disposition: A | Discharge status: Home / Self Care | Payer: Medicare Other | Source: Ambulatory Visit | Attending: Internal Medicine | Admitting: Internal Medicine

## 2013-08-01 ENCOUNTER — Other Ambulatory Visit: Payer: Self-pay | Admitting: Internal Medicine

## 2013-08-01 DIAGNOSIS — R928 Other abnormal and inconclusive findings on diagnostic imaging of breast: Secondary | ICD-10-CM | POA: Diagnosis not present

## 2013-08-01 DIAGNOSIS — R921 Mammographic calcification found on diagnostic imaging of breast: Secondary | ICD-10-CM

## 2013-08-03 DIAGNOSIS — M67919 Unspecified disorder of synovium and tendon, unspecified shoulder: Secondary | ICD-10-CM | POA: Diagnosis not present

## 2013-08-06 ENCOUNTER — Inpatient Hospital Stay: Admission: RE | Admit: 2013-08-06 | Payer: Medicare Other | Source: Ambulatory Visit

## 2013-08-10 DIAGNOSIS — M719 Bursopathy, unspecified: Secondary | ICD-10-CM | POA: Diagnosis not present

## 2013-08-10 DIAGNOSIS — M67919 Unspecified disorder of synovium and tendon, unspecified shoulder: Secondary | ICD-10-CM | POA: Diagnosis not present

## 2013-08-13 ENCOUNTER — Ambulatory Visit
Admission: RE | Admit: 2013-08-13 | Discharge: 2013-08-13 | Disposition: A | Discharge status: Home / Self Care | Payer: Medicare Other | Source: Ambulatory Visit | Attending: Internal Medicine | Admitting: Internal Medicine

## 2013-08-13 DIAGNOSIS — D249 Benign neoplasm of unspecified breast: Secondary | ICD-10-CM | POA: Diagnosis not present

## 2013-08-13 DIAGNOSIS — R928 Other abnormal and inconclusive findings on diagnostic imaging of breast: Secondary | ICD-10-CM | POA: Diagnosis not present

## 2013-08-13 DIAGNOSIS — R921 Mammographic calcification found on diagnostic imaging of breast: Secondary | ICD-10-CM

## 2013-08-15 DIAGNOSIS — H04209 Unspecified epiphora, unspecified lacrimal gland: Secondary | ICD-10-CM | POA: Diagnosis not present

## 2013-08-24 ENCOUNTER — Ambulatory Visit: Payer: Medicare Other | Admitting: Internal Medicine

## 2013-08-29 ENCOUNTER — Other Ambulatory Visit: Payer: Self-pay | Admitting: Internal Medicine

## 2013-09-03 DIAGNOSIS — G609 Hereditary and idiopathic neuropathy, unspecified: Secondary | ICD-10-CM | POA: Diagnosis not present

## 2013-09-03 DIAGNOSIS — R609 Edema, unspecified: Secondary | ICD-10-CM | POA: Diagnosis not present

## 2013-09-03 DIAGNOSIS — H538 Other visual disturbances: Secondary | ICD-10-CM | POA: Diagnosis not present

## 2013-09-03 DIAGNOSIS — M7730 Calcaneal spur, unspecified foot: Secondary | ICD-10-CM | POA: Diagnosis not present

## 2013-09-05 DIAGNOSIS — H6120 Impacted cerumen, unspecified ear: Secondary | ICD-10-CM | POA: Diagnosis not present

## 2013-09-05 DIAGNOSIS — H905 Unspecified sensorineural hearing loss: Secondary | ICD-10-CM | POA: Diagnosis not present

## 2013-09-05 DIAGNOSIS — H9120 Sudden idiopathic hearing loss, unspecified ear: Secondary | ICD-10-CM | POA: Diagnosis not present

## 2013-09-18 DIAGNOSIS — H2510 Age-related nuclear cataract, unspecified eye: Secondary | ICD-10-CM | POA: Diagnosis not present

## 2013-09-24 DIAGNOSIS — C649 Malignant neoplasm of unspecified kidney, except renal pelvis: Secondary | ICD-10-CM | POA: Diagnosis not present

## 2013-09-25 ENCOUNTER — Encounter: Payer: Self-pay | Admitting: Internal Medicine

## 2013-09-25 ENCOUNTER — Ambulatory Visit (INDEPENDENT_AMBULATORY_CARE_PROVIDER_SITE_OTHER): Payer: Medicare Other | Admitting: Internal Medicine

## 2013-09-25 VITALS — BP 80/44 | HR 88 | Ht 62.0 in | Wt 139.4 lb

## 2013-09-25 DIAGNOSIS — K3184 Gastroparesis: Secondary | ICD-10-CM | POA: Diagnosis not present

## 2013-09-25 DIAGNOSIS — K5900 Constipation, unspecified: Secondary | ICD-10-CM | POA: Diagnosis not present

## 2013-09-25 MED ORDER — POLYETHYLENE GLYCOL 3350 17 GM/SCOOP PO POWD: ORAL | Status: DC

## 2013-09-25 MED ORDER — METOCLOPRAMIDE HCL 5 MG PO TABS: 5.0000 mg | ORAL_TABLET | Freq: Three times a day (TID) | ORAL | Status: DC

## 2013-09-25 NOTE — Progress Notes (Signed)
[name] [birth year] [id number]  Note: This dictation was prepared with Dragon digital system. Any transcriptional errors that result from this procedure are unintentional.   History of Present Illness:  This is a 68 year old white female diabetic who is followed for gastroparesis and constipation. She was seen in 07/20/2013 by an extender and was started on Reglan 5 mg 4 times a day. She is much improved today and voices no complaints. She still has constipation for which she take Miralax 17 g daily but even with MiraLax, she sometimes does not have a BM for 2 or 3 days. Her last colonoscopy in February 2013 showed an ulcer at the ileocecal valve, hyperplastic and an an adenomatous polyp. She has a history of a right nephrectomy for cancer and an open cholecystectomy.    Past Medical History  Diagnosis Date  . Renal cell carcinoma   . Asthma   . Diabetes mellitus type II   . GERD (gastroesophageal reflux disease)   . Gastroparesis   . Diverticulosis   . Anxiety   . Depression   . History of breast cancer   . History of adenomatous polyp of colon   . Hyperlipidemia   . COPD (chronic obstructive pulmonary disease)   . Pulmonary nodule     multiple  . Osteoarthritis   . Carcinoid tumor of lung 1991  . Mild cognitive impairment     Past Surgical History  Procedure Laterality Date  . Cholecystectomy  1975  . Tonsillectomy and adenoidectomy  1953  . Breast lumpectomy  1998    right breast  . Lung surgery  1994    carcinoid removal  . Laparoscopic nephrectomy  01/2009    right    Allergies  Allergen Reactions  . Budesonide-Formoterol Fumarate Other (See Comments)    REACTION: coughed worse  . Penicillins Rash    REACTION: rash    Family history and social history have been reviewed.  Review of Systems:   The remainder of the 10 point ROS is negative except as outlined in the H&P  Physical Exam: General Appearance Well developed, in no distress Eyes  Non  icteric  HEENT  Non traumatic, normocephalic  Mouth No lesion, tongue papillated, no cheilosis Neck Supple without adenopathy, thyroid not enlarged, no carotid bruits, no JVD Lungs Clear to auscultation bilaterally, fine expiratory wheezes COR Normal S1, normal S2, regular rhythm, no murmur, quiet precordium Abdomen relaxed with striae. Normoactive bowel sounds. Mild tenderness in epigastrium. Liver edge at costal margin Rectal not done Extremities  No pedal edema Skin No lesions Neurological Alert and oriented x 3 Psychological Normal mood and affect  Assessment and Plan:   Problem #25 68 year old white female who is currently asymptomatic. Her gastroparesis is managed with Reglan. She takes that when necessary, usually twice a day. She also has constipation. I have advised her to increase MiraLax to 17 g in the morning and 9 g at night. A new prescription has been given. She is up-to-date on her colonoscopy.  Problem #2 History of hyperplastic and adenomatous polyps. A recall colonoscopy is due around February 2018.    Delfin Edis 09/25/2013

## 2013-09-25 NOTE — Patient Instructions (Signed)
We have sent the following medications to your pharmacy for you to pick up at your convenience: Reglan Miralax  CC:Dr Viviana Simpler

## 2013-09-26 DIAGNOSIS — H6980 Other specified disorders of Eustachian tube, unspecified ear: Secondary | ICD-10-CM | POA: Diagnosis not present

## 2013-09-26 DIAGNOSIS — H905 Unspecified sensorineural hearing loss: Secondary | ICD-10-CM | POA: Diagnosis not present

## 2013-09-26 DIAGNOSIS — H903 Sensorineural hearing loss, bilateral: Secondary | ICD-10-CM | POA: Diagnosis not present

## 2013-10-15 ENCOUNTER — Encounter: Payer: Self-pay | Admitting: Internal Medicine

## 2013-10-15 ENCOUNTER — Ambulatory Visit (INDEPENDENT_AMBULATORY_CARE_PROVIDER_SITE_OTHER): Payer: Medicare Other | Admitting: Internal Medicine

## 2013-10-15 VITALS — BP 120/70 | HR 87 | Temp 98.5°F | Wt 140.0 lb

## 2013-10-15 DIAGNOSIS — E1149 Type 2 diabetes mellitus with other diabetic neurological complication: Secondary | ICD-10-CM

## 2013-10-15 DIAGNOSIS — G3184 Mild cognitive impairment, so stated: Secondary | ICD-10-CM

## 2013-10-15 DIAGNOSIS — R609 Edema, unspecified: Secondary | ICD-10-CM | POA: Diagnosis not present

## 2013-10-15 MED ORDER — DONEPEZIL HCL 5 MG PO TABS: 5.0000 mg | ORAL_TABLET | Freq: Every day | ORAL | Status: DC

## 2013-10-15 MED ORDER — FUROSEMIDE 20 MG PO TABS: 20.0000 mg | ORAL_TABLET | Freq: Every day | ORAL | Status: DC | PRN

## 2013-10-15 NOTE — Patient Instructions (Signed)
Please stop using salt--you can try salt substitute. You can try the fluid pill (furosemide) when the leg swelling is bad in the morning. Please let me know if you have any problems (like stomach upset) with the memory medication (donepezil)  Low-Sodium Eating Plan Sodium raises blood pressure and causes water to be held in the body. Getting less sodium from food will help lower your blood pressure, reduce any swelling, and protect your heart, liver, and kidneys. We get sodium by adding salt (sodium chloride) to food. Most of our sodium comes from canned, boxed, and frozen foods. Restaurant foods, fast foods, and pizza are also very high in sodium. Even if you take medicine to lower your blood pressure or to reduce fluid in your body, getting less sodium from your food is important. WHAT IS MY PLAN? Most people should limit their sodium intake to 2,300 mg a day. Your health care provider recommends that you limit your sodium intake to __________ a day.  WHAT DO I NEED TO KNOW ABOUT THIS EATING PLAN? For the low-sodium eating plan, you will follow these general guidelines:  Choose foods with a % Daily Value for sodium of less than 5% (as listed on the food label).   Use salt-free seasonings or herbs instead of table salt or sea salt.   Check with your health care provider or pharmacist before using salt substitutes.   Eat fresh foods.  Eat more vegetables and fruits.  Limit canned vegetables. If you do use them, rinse them well to decrease the sodium.   Limit cheese to 1 oz (28 g) per day.   Eat lower-sodium products, often labeled as "lower sodium" or "no salt added."  Avoid foods that contain monosodium glutamate (MSG). MSG is sometimes added to [place] food and some canned foods.  Check food labels (Nutrition Facts labels) on foods to learn how much sodium is in one serving.  Eat more home-cooked food and less restaurant, buffet, and fast food.  When eating at a  restaurant, ask that your food be prepared with less salt or none, if possible.  HOW DO I READ FOOD LABELS FOR SODIUM INFORMATION? The Nutrition Facts label lists the amount of sodium in one serving of the food. If you eat more than one serving, you must multiply the listed amount of sodium by the number of servings. Food labels may also identify foods as:  Sodium free--Less than 5 mg in a serving.  Very low sodium--35 mg or less in a serving.  Low sodium--140 mg or less in a serving.  Light in sodium--50% less sodium in a serving. For example, if a food that usually has 300 mg of sodium is changed to become light in sodium, it will have 150 mg of sodium.  Reduced sodium--25% less sodium in a serving. For example, if a food that usually has 400 mg of sodium is changed to reduced sodium, it will have 300 mg of sodium. WHAT FOODS CAN I EAT? Grains Low-sodium cereals, including oats, puffed wheat and rice, and shredded wheat cereals. Low-sodium crackers. Unsalted rice and pasta. Lower-sodium bread.  Vegetables Frozen or fresh vegetables. Low-sodium or reduced-sodium canned vegetables. Low-sodium or reduced-sodium tomato sauce and paste. Low-sodium or reduced-sodium tomato and vegetable juices.  Fruits Fresh, frozen, and canned fruit. Fruit juice.  Meat and Other Protein Products Low-sodium canned tuna and salmon. Fresh or frozen meat, poultry, seafood, and fish. Lamb. Unsalted nuts. Dried beans, peas, and lentils without added salt. Unsalted canned beans. Homemade  soups without salt. Eggs.  Dairy Milk. Soy milk. Ricotta cheese. Low-sodium or reduced-sodium cheeses. Yogurt.  Condiments Fresh and dried herbs and spices. Salt-free seasonings. Onion and garlic powders. Low-sodium varieties of mustard and ketchup. Lemon juice.  Fats and Oils Reduced-sodium salad dressings. Unsalted butter.  Other Unsalted popcorn and pretzels.  The items listed above may not be a complete list of  recommended foods or beverages. Contact your dietitian for more options. WHAT FOODS ARE NOT RECOMMENDED? Grains Instant hot cereals. Bread stuffing, pancake, and biscuit mixes. Croutons. Seasoned rice or pasta mixes. Noodle soup cups. Boxed or frozen macaroni and cheese. Self-rising flour. Regular salted crackers. Vegetables Regular canned vegetables. Regular canned tomato sauce and paste. Regular tomato and vegetable juices. Frozen vegetables in sauces. Salted french fries. Olives. Angie Fava. Relishes. Sauerkraut. Salsa. Meat and Other Protein Products Salted, canned, smoked, spiced, or pickled meats, seafood, or fish. Bacon, ham, sausage, hot dogs, corned beef, chipped beef, and packaged luncheon meats. Salt pork. Jerky. Pickled herring. Anchovies, regular canned tuna, and sardines. Salted nuts. Dairy Processed cheese and cheese spreads. Cheese curds. Blue cheese and cottage cheese. Buttermilk.  Condiments Onion and garlic salt, seasoned salt, table salt, and sea salt. Canned and packaged gravies. Worcestershire sauce. Tartar sauce. Barbecue sauce. Teriyaki sauce. Soy sauce, including reduced sodium. Steak sauce. Fish sauce. Oyster sauce. Cocktail sauce. Horseradish. Regular ketchup and mustard. Meat flavorings and tenderizers. Bouillon cubes. Hot sauce. Tabasco sauce. Marinades. Taco seasonings. Relishes. Fats and Oils Regular salad dressings. Salted butter. Margarine. Ghee. Bacon fat.  Other Potato and tortilla chips. Corn chips and puffs. Salted popcorn and pretzels. Canned or dried soups. Pizza. Frozen entrees and pot pies.  The items listed above may not be a complete list of foods and beverages to avoid. Contact your dietitian for more information. Document Released: 08/14/2001 Document Revised: 02/27/2013 Document Reviewed: 12/27/2012 [facility] Patient Information 2015 [place], [place]. This information is not intended to replace advice given to you by your health care provider. Make  sure you discuss any questions you have with your health care provider.

## 2013-10-15 NOTE — Progress Notes (Signed)
Subjective:    Patient ID: [name], female    DOB: 1945/10/26, 68 y.o.   MRN: [id number]  HPI Here with daughter Rip Harbour  Concerned about leg swelling Noticed it over the past 6 weeks---noted when she was out of town. Had been on her feet a lot Then went away for 2-3 weeks Now has recurred over the past week Will finally get better with elevation Still some present in the morning--unless she sleeps on couch with a couple of pillows  Does put some salt on her food--trying to be careful  [place] yesterday Some soreness and she has a sense of SOB since then (not before) Sore in shoulders and neck---fell backwards onto floor No syncope--shoe got stuck  Sleeps flat generally No PND No chest pain No palpitations No prior edema No leg pain No ulcers on legs  Checks sugars infrequently Still okay-- she doesn't remember the numbers  No hypoglycemic reactions  Current Outpatient Prescriptions on File Prior to Visit  Medication Sig Dispense Refill  . albuterol (PROAIR HFA) 108 (90 BASE) MCG/ACT inhaler Inhale 2 puffs into the lungs every 6 (six) hours as needed for wheezing or shortness of breath.      Marland Kitchen atorvastatin (LIPITOR) 40 MG tablet Take 40 mg by mouth daily.      . Cholecalciferol (VITAMIN D3) 5000 UNITS CAPS Take 1 capsule by mouth every morning.      . citalopram (CELEXA) 20 MG tablet TAKE 1 TABLET DAILY OR AS  DIRECTED  90 tablet  0  . famotidine (PEPCID) 20 MG tablet Take 20 mg by mouth at bedtime as needed (for flare of cough).      . fexofenadine (ALLEGRA) 180 MG tablet Take 180 mg by mouth daily as needed (nasal drainage).      . fluticasone (FLONASE) 50 MCG/ACT nasal spray USE 2 SPRAYS NASALLY DAILY  48 g  1  . irbesartan (AVAPRO) 150 MG tablet Take 150 mg by mouth daily.      Marland Kitchen L-Methylfolate-Algae-B12-B6 (METANX) 3-90.314-2-35 MG CAPS Take 180 mg by mouth 2 (two) times daily.      Marland Kitchen LYRICA 75 MG capsule Take 75 mg by mouth 3 (three) times daily.       .  metformin (FORTAMET) 1000 MG (OSM) 24 hr tablet TAKE 1 TABLET TWICE A DAY  180 tablet  3  . metoCLOPramide (REGLAN) 5 MG tablet Take 1 tablet (5 mg total) by mouth 4 (four) times daily -  before meals and at bedtime.  120 tablet  3  . mometasone-formoterol (DULERA) 200-5 MCG/ACT AERO Take 2 puffs first thing in am and then another 2 puffs about 12 hours later.  3 Inhaler  3  . montelukast (SINGULAIR) 10 MG tablet Take 1 tablet (10 mg total) by mouth daily.  30 tablet  11  . Multiple Vitamin (MULTIVITAMIN) tablet Take 1 tablet by mouth every morning.       . Omega-3 Fatty Acids (FISH OIL) 1200 MG CAPS Take 1 capsule by mouth every morning. (place in the freezer)      . omeprazole (PRILOSEC) 20 MG capsule Take 1 capsule (20 mg total) by mouth daily as needed.  90 capsule  3  . polyethylene glycol powder (GLYCOLAX/MIRALAX) powder Take 17 grams (1 capful) every morning and 9 grams (1/2 capful) every evening  850 g  10  . tiotropium (SPIRIVA) 18 MCG inhalation capsule Place 1 capsule (18 mcg total) into inhaler and inhale daily.  90 capsule  3  . vitamin C (ASCORBIC ACID) 500 MG tablet Take 500 mg by mouth every morning.       No current facility-administered medications on file prior to visit.    Allergies  Allergen Reactions  . Budesonide-Formoterol Fumarate Other (See Comments)    REACTION: coughed worse  . Penicillins Rash    REACTION: rash    Past Medical History  Diagnosis Date  . Renal cell carcinoma   . Asthma   . Diabetes mellitus type II   . GERD (gastroesophageal reflux disease)   . Gastroparesis   . Diverticulosis   . Anxiety   . Depression   . History of breast cancer   . History of adenomatous polyp of colon   . Hyperlipidemia   . COPD (chronic obstructive pulmonary disease)   . Pulmonary nodule     multiple  . Osteoarthritis   . Carcinoid tumor of lung 1991  . Mild cognitive impairment     Past Surgical History  Procedure Laterality Date  . Cholecystectomy  1975   . Tonsillectomy and adenoidectomy  1953  . Breast lumpectomy  1998    right breast  . Lung surgery  1994    carcinoid removal  . Laparoscopic nephrectomy  01/2009    right    Family History  Problem Relation Age of Onset  . Colon cancer Father   . Lung cancer Father   . Stroke Mother     History   Social History  . Marital Status: Married    Spouse Name: N/A    Number of Children: 2  . Years of Education: N/A   Occupational History  . retired Science writer    Social History Main Topics  . Smoking status: Former Smoker -- 1.00 packs/day for 30 years    Types: Cigarettes    Quit date: 03/08/1993  . Smokeless tobacco: Never Used  . Alcohol Use: No  . Drug Use: No  . Sexual Activity: Not on file   Other Topics Concern  . Not on file   Social History Narrative   No living will   Requests husband as health care POA   Would accept attempts at resuscitation   No tube feeds if cognitively unaware            Review of Systems Never a great sleeper---but doing okay Awakens 4AM regardless of sleep time---will just get up Appetite is fine Weight is stable     Objective:   Physical Exam  Constitutional: She appears well-developed and well-nourished. No distress.  Neck: Normal range of motion. Neck supple. No thyromegaly present.  Cardiovascular: Normal rate, regular rhythm, normal heart sounds and intact distal pulses.  Exam reveals no gallop.   No murmur heard. Pulmonary/Chest: Effort normal and breath sounds normal. No respiratory distress. She has no wheezes.  Musculoskeletal:  1+ pitting edema-- right slightly more than left  Lymphadenopathy:    She has no cervical adenopathy.  Psychiatric: She has a normal mood and affect. Her behavior is normal.          Assessment & Plan:

## 2013-10-15 NOTE — Assessment & Plan Note (Addendum)
Mild Nothing to suggest CHF Will recheck renal and hepatic labs and CBC just in case Probably venous insufficiency Discussed support hose Low salt diet Furosemide for prn

## 2013-10-15 NOTE — Assessment & Plan Note (Signed)
Hopefully still good control

## 2013-10-15 NOTE — Progress Notes (Signed)
Pre visit review using our clinic review tool, if applicable. No additional management support is needed unless otherwise documented below in the visit note. 

## 2013-10-15 NOTE — Assessment & Plan Note (Addendum)
Has worsened now Now with some apraxia--no longer able to cook (can't manage things) MRI not definite about vascular Her realization about her decline argues against Alzheimers Labs were okay Will  donepezil

## 2013-10-16 ENCOUNTER — Other Ambulatory Visit: Payer: Self-pay | Admitting: *Deleted

## 2013-10-16 DIAGNOSIS — R918 Other nonspecific abnormal finding of lung field: Secondary | ICD-10-CM

## 2013-10-16 LAB — CBC WITH DIFFERENTIAL/PLATELET
BASOS ABS: 0 10*3/uL (ref 0.0–0.1)
Basophils Relative: 0.4 % (ref 0.0–3.0)
EOS ABS: 0.1 10*3/uL (ref 0.0–0.7)
Eosinophils Relative: 1.4 % (ref 0.0–5.0)
HCT: 38.3 % (ref 36.0–46.0)
Hemoglobin: 12.4 g/dL (ref 12.0–15.0)
LYMPHS PCT: 36.1 % (ref 12.0–46.0)
Lymphs Abs: 3.3 10*3/uL (ref 0.7–4.0)
MCHC: 32.4 g/dL (ref 30.0–36.0)
MCV: 98.4 fl (ref 78.0–100.0)
Monocytes Absolute: 0.5 10*3/uL (ref 0.1–1.0)
Monocytes Relative: 5.2 % (ref 3.0–12.0)
NEUTROS ABS: 5.2 10*3/uL (ref 1.4–7.7)
NEUTROS PCT: 56.9 % (ref 43.0–77.0)
Platelets: 249 10*3/uL (ref 150.0–400.0)
RBC: 3.89 Mil/uL (ref 3.87–5.11)
RDW: 16.6 % — AB (ref 11.5–15.5)
WBC: 9.2 10*3/uL (ref 4.0–10.5)

## 2013-10-16 LAB — COMPREHENSIVE METABOLIC PANEL
ALBUMIN: 3.8 g/dL (ref 3.5–5.2)
ALT: 26 U/L (ref 0–35)
AST: 25 U/L (ref 0–37)
Alkaline Phosphatase: 47 U/L (ref 39–117)
BUN: 14 mg/dL (ref 6–23)
CALCIUM: 9.8 mg/dL (ref 8.4–10.5)
CHLORIDE: 105 meq/L (ref 96–112)
CO2: 25 meq/L (ref 19–32)
Creatinine, Ser: 1.1 mg/dL (ref 0.4–1.2)
GFR: 52.71 mL/min — ABNORMAL LOW (ref >60.00)
Glucose, Bld: 99 mg/dL (ref 70–99)
POTASSIUM: 4.6 meq/L (ref 3.5–5.1)
Sodium: 141 mEq/L (ref 135–145)
TOTAL PROTEIN: 6.9 g/dL (ref 6.0–8.3)
Total Bilirubin: 0.6 mg/dL (ref 0.2–1.2)

## 2013-10-16 LAB — HEMOGLOBIN A1C: HEMOGLOBIN A1C: 8.3 % — AB (ref 4.6–6.5)

## 2013-10-22 ENCOUNTER — Ambulatory Visit: Payer: Self-pay | Admitting: Ophthalmology

## 2013-10-22 DIAGNOSIS — Z01810 Encounter for preprocedural cardiovascular examination: Secondary | ICD-10-CM

## 2013-10-22 DIAGNOSIS — I499 Cardiac arrhythmia, unspecified: Secondary | ICD-10-CM

## 2013-10-22 DIAGNOSIS — H2510 Age-related nuclear cataract, unspecified eye: Secondary | ICD-10-CM | POA: Diagnosis not present

## 2013-10-22 LAB — POTASSIUM: POTASSIUM: 4 mmol/L (ref 3.5–5.1)

## 2013-10-29 ENCOUNTER — Ambulatory Visit: Payer: Self-pay | Admitting: Ophthalmology

## 2013-10-29 DIAGNOSIS — E7800 Pure hypercholesterolemia, unspecified: Secondary | ICD-10-CM | POA: Diagnosis not present

## 2013-10-29 DIAGNOSIS — E119 Type 2 diabetes mellitus without complications: Secondary | ICD-10-CM | POA: Diagnosis not present

## 2013-10-29 DIAGNOSIS — Z905 Acquired absence of kidney: Secondary | ICD-10-CM | POA: Diagnosis not present

## 2013-10-29 DIAGNOSIS — H2510 Age-related nuclear cataract, unspecified eye: Secondary | ICD-10-CM | POA: Diagnosis not present

## 2013-10-29 DIAGNOSIS — Z880 Allergy status to penicillin: Secondary | ICD-10-CM | POA: Diagnosis not present

## 2013-10-29 DIAGNOSIS — Z853 Personal history of malignant neoplasm of breast: Secondary | ICD-10-CM | POA: Diagnosis not present

## 2013-10-29 DIAGNOSIS — K219 Gastro-esophageal reflux disease without esophagitis: Secondary | ICD-10-CM | POA: Diagnosis not present

## 2013-10-29 DIAGNOSIS — H269 Unspecified cataract: Secondary | ICD-10-CM | POA: Diagnosis not present

## 2013-10-29 DIAGNOSIS — J449 Chronic obstructive pulmonary disease, unspecified: Secondary | ICD-10-CM | POA: Diagnosis not present

## 2013-10-29 DIAGNOSIS — Z8553 Personal history of malignant neoplasm of renal pelvis: Secondary | ICD-10-CM | POA: Diagnosis not present

## 2013-10-31 ENCOUNTER — Other Ambulatory Visit: Payer: Self-pay

## 2013-10-31 MED ORDER — ATORVASTATIN CALCIUM 40 MG PO TABS: 40.0000 mg | ORAL_TABLET | Freq: Every day | ORAL | Status: DC

## 2013-10-31 NOTE — Telephone Encounter (Signed)
Pt request refill of atorvastatin to [facility]; pt has older bottle dated 05/2012 but thinks she has been taking atorvastatin daily.Please advise.

## 2013-10-31 NOTE — Telephone Encounter (Signed)
rx sent to pharmacy by e-script  

## 2013-11-08 DIAGNOSIS — E119 Type 2 diabetes mellitus without complications: Secondary | ICD-10-CM | POA: Diagnosis not present

## 2013-11-11 ENCOUNTER — Other Ambulatory Visit: Payer: Self-pay | Admitting: Internal Medicine

## 2013-11-15 DIAGNOSIS — E119 Type 2 diabetes mellitus without complications: Secondary | ICD-10-CM | POA: Diagnosis not present

## 2013-11-15 DIAGNOSIS — R0609 Other forms of dyspnea: Secondary | ICD-10-CM | POA: Diagnosis not present

## 2013-11-15 DIAGNOSIS — G609 Hereditary and idiopathic neuropathy, unspecified: Secondary | ICD-10-CM | POA: Diagnosis not present

## 2013-11-27 DIAGNOSIS — H01009 Unspecified blepharitis unspecified eye, unspecified eyelid: Secondary | ICD-10-CM | POA: Diagnosis not present

## 2013-11-27 DIAGNOSIS — S81809A Unspecified open wound, unspecified lower leg, initial encounter: Secondary | ICD-10-CM | POA: Diagnosis not present

## 2013-11-27 DIAGNOSIS — M25569 Pain in unspecified knee: Secondary | ICD-10-CM | POA: Diagnosis not present

## 2013-11-27 DIAGNOSIS — Z0000 Encounter for general adult medical examination without abnormal findings: Secondary | ICD-10-CM | POA: Diagnosis not present

## 2013-11-27 DIAGNOSIS — Z23 Encounter for immunization: Secondary | ICD-10-CM | POA: Diagnosis not present

## 2013-11-28 ENCOUNTER — Other Ambulatory Visit: Payer: Medicare Other

## 2013-11-28 ENCOUNTER — Ambulatory Visit: Payer: Medicare Other | Admitting: Cardiothoracic Surgery

## 2013-11-29 DIAGNOSIS — M25569 Pain in unspecified knee: Secondary | ICD-10-CM | POA: Diagnosis not present

## 2013-11-29 DIAGNOSIS — S8990XA Unspecified injury of unspecified lower leg, initial encounter: Secondary | ICD-10-CM | POA: Diagnosis not present

## 2013-11-29 DIAGNOSIS — S99919A Unspecified injury of unspecified ankle, initial encounter: Secondary | ICD-10-CM | POA: Diagnosis not present

## 2013-12-02 ENCOUNTER — Other Ambulatory Visit: Payer: Self-pay | Admitting: Internal Medicine

## 2013-12-05 ENCOUNTER — Ambulatory Visit: Payer: Medicare Other | Admitting: Cardiothoracic Surgery

## 2013-12-12 ENCOUNTER — Ambulatory Visit (INDEPENDENT_AMBULATORY_CARE_PROVIDER_SITE_OTHER): Payer: Medicare Other | Admitting: Cardiothoracic Surgery

## 2013-12-12 ENCOUNTER — Ambulatory Visit
Admission: RE | Admit: 2013-12-12 | Discharge: 2013-12-12 | Disposition: A | Discharge status: Home / Self Care | Payer: Medicare Other | Source: Ambulatory Visit | Attending: Cardiothoracic Surgery | Admitting: Cardiothoracic Surgery

## 2013-12-12 ENCOUNTER — Encounter: Payer: Self-pay | Admitting: Cardiothoracic Surgery

## 2013-12-12 VITALS — BP 92/61 | HR 85 | Resp 20 | Ht 62.0 in | Wt 139.0 lb

## 2013-12-12 DIAGNOSIS — Z853 Personal history of malignant neoplasm of breast: Secondary | ICD-10-CM

## 2013-12-12 DIAGNOSIS — Z8709 Personal history of other diseases of the respiratory system: Secondary | ICD-10-CM | POA: Diagnosis not present

## 2013-12-12 DIAGNOSIS — Z87898 Personal history of other specified conditions: Secondary | ICD-10-CM

## 2013-12-12 DIAGNOSIS — Z85528 Personal history of other malignant neoplasm of kidney: Secondary | ICD-10-CM | POA: Diagnosis not present

## 2013-12-12 DIAGNOSIS — R918 Other nonspecific abnormal finding of lung field: Secondary | ICD-10-CM

## 2013-12-12 DIAGNOSIS — J45909 Unspecified asthma, uncomplicated: Secondary | ICD-10-CM | POA: Diagnosis not present

## 2013-12-12 NOTE — Progress Notes (Signed)
PCP is Viviana Simpler, MD Referring Provider is Dahlstedt, Lillette Boxer, MD  Chief Complaint  Patient presents with  . Follow-up    1 year f/u with Chest CT    HPI: 68 year old reformed smoker with significant COPD and history of probable inflammatory lung disease from [place] returns for review of anal CT scan of chest. 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 The patient had a renal cell carcinoma resected in 2010. The patient has diabetes and dementia with an unremarkable brain MRI.  She denies any significant changes in her pulmonary status. She is a chronic wet cough. She counts she required any antibiotics over the winter for her COPD flareup. She denies weight loss, fever, night sweats.  CT scan of the chest today shows her chronic bilateral diffuse nodule disease, chronic scarring of the right midlung zone right upper lobe and slight increase in size of a discrete nodule in the superior segment right lower lobe from 4 mm to 7 mm.  Scan also notes a new 2 cm hypodense area in the liver which was not present on previous CT scan-- does not appear to be a hemangioma.. Liver protocol MRI is recommended which will be forwarded to her primary care physician.  Past Medical History  Diagnosis Date  . Renal cell carcinoma   . Asthma   . Diabetes mellitus type II   . GERD (gastroesophageal reflux disease)   . Gastroparesis   . Diverticulosis   . Anxiety   . Depression   . History of breast cancer   . History of adenomatous polyp of colon   . Hyperlipidemia   . COPD (chronic obstructive pulmonary disease)   . Pulmonary nodule     multiple  . Osteoarthritis   . Carcinoid tumor of lung 1991  . Mild cognitive impairment     Past Surgical History  Procedure Laterality Date  . Cholecystectomy  1975  . Tonsillectomy and adenoidectomy  1953  . Breast lumpectomy  1998    right breast  . Lung surgery  1994    carcinoid removal  . Laparoscopic nephrectomy   01/2009    right    Family History  Problem Relation Age of Onset  . Colon cancer Father   . Lung cancer Father   . Stroke Mother     Social History History  Substance Use Topics  . Smoking status: Former Smoker -- 1.00 packs/day for 30 years    Types: Cigarettes    Quit date: 03/08/1993  . Smokeless tobacco: Never Used  . Alcohol Use: No    Current Outpatient Prescriptions  Medication Sig Dispense Refill  . albuterol (PROAIR HFA) 108 (90 BASE) MCG/ACT inhaler Inhale 2 puffs into the lungs every 6 (six) hours as needed for wheezing or shortness of breath.      Marland Kitchen atorvastatin (LIPITOR) 40 MG tablet Take 1 tablet (40 mg total) by mouth daily.  90 tablet  3  . Cholecalciferol (VITAMIN D3) 5000 UNITS CAPS Take 1 capsule by mouth every morning.      . citalopram (CELEXA) 20 MG tablet TAKE 1 TABLET DAILY OR AS  DIRECTED  90 tablet  0  . donepezil (ARICEPT) 5 MG tablet Take 1 tablet (5 mg total) by mouth at bedtime.  30 tablet  3  . famotidine (PEPCID) 20 MG tablet Take 20 mg by mouth at bedtime as needed (for flare of cough).      . fexofenadine (ALLEGRA) 180  MG tablet Take 180 mg by mouth daily as needed (nasal drainage).      . fluticasone (FLONASE) 50 MCG/ACT nasal spray USE 2 SPRAYS NASALLY DAILY  48 g  1  . furosemide (LASIX) 20 MG tablet TAKE 1 TABLET BY MOUTH DAILY AS NEEDED FOR EDEMA  30 tablet  0  . irbesartan (AVAPRO) 150 MG tablet Take 150 mg by mouth daily.      Marland Kitchen L-Methylfolate-Algae-B12-B6 (METANX) 3-90.314-2-35 MG CAPS Take 180 mg by mouth 2 (two) times daily.      Marland Kitchen LYRICA 75 MG capsule Take 75 mg by mouth 3 (three) times daily.       . metformin (FORTAMET) 1000 MG (OSM) 24 hr tablet TAKE 1 TABLET TWICE A DAY  180 tablet  3  . metoCLOPramide (REGLAN) 5 MG tablet Take 1 tablet (5 mg total) by mouth 4 (four) times daily -  before meals and at bedtime.  120 tablet  3  . mometasone-formoterol (DULERA) 200-5 MCG/ACT AERO Take 2 puffs first thing in am and then another 2  puffs about 12 hours later.  3 Inhaler  3  . montelukast (SINGULAIR) 10 MG tablet Take 1 tablet (10 mg total) by mouth daily.  30 tablet  11  . Multiple Vitamin (MULTIVITAMIN) tablet Take 1 tablet by mouth every morning.       . Omega-3 Fatty Acids (FISH OIL) 1200 MG CAPS Take 1 capsule by mouth every morning. (place in the freezer)      . omeprazole (PRILOSEC) 20 MG capsule Take 1 capsule (20 mg total) by mouth daily as needed.  90 capsule  3  . polyethylene glycol powder (GLYCOLAX/MIRALAX) powder Take 17 grams (1 capful) every morning and 9 grams (1/2 capful) every evening  850 g  10  . tiotropium (SPIRIVA) 18 MCG inhalation capsule Place 1 capsule (18 mcg total) into inhaler and inhale daily.  90 capsule  3  . vitamin C (ASCORBIC ACID) 500 MG tablet Take 500 mg by mouth every morning.       No current facility-administered medications for this visit.    Allergies  Allergen Reactions  . Budesonide-Formoterol Fumarate Other (See Comments)    REACTION: coughed worse  . Penicillins Rash    REACTION: rash    Review of SystemsPatient recently fell and skin the skin on her left leg. She states she tripped and did not pass out  BP 92/61  Pulse 85  Resp 20  Ht 5\' 2"  (1.575 m)  Wt 139 lb (63.05 kg)  BMI 25.42 kg/m2  SpO2 93% Physical Exam Elderly female slightly disheveled company by husband Lungs with scattered rhonchi No palpable nodes in the neck Heart rhythm regular  traumatic eschar skin changes over left pretibial area  Diagnostic Tests: CT scan of chest results reviewed with patient and husband including enlargement of a discrete nodule in the superior segment right lower lobe    Impression:  Patient to return for followup CT scan in 4-5 months to review the nodule showing slight growth in the superior segment right lower lobe. It is currently too small to biopsy or to assess by PET scan   Plan:

## 2013-12-13 ENCOUNTER — Ambulatory Visit (INDEPENDENT_AMBULATORY_CARE_PROVIDER_SITE_OTHER): Payer: Medicare Other | Admitting: Internal Medicine

## 2013-12-13 ENCOUNTER — Encounter: Payer: Self-pay | Admitting: Internal Medicine

## 2013-12-13 VITALS — BP 110/58 | HR 89 | Temp 98.3°F | Resp 16 | Wt 130.0 lb

## 2013-12-13 DIAGNOSIS — F39 Unspecified mood [affective] disorder: Secondary | ICD-10-CM | POA: Diagnosis not present

## 2013-12-13 DIAGNOSIS — G3184 Mild cognitive impairment, so stated: Secondary | ICD-10-CM

## 2013-12-13 MED ORDER — DONEPEZIL HCL 10 MG PO TABS: 10.0000 mg | ORAL_TABLET | Freq: Every day | ORAL | Status: DC

## 2013-12-13 MED ORDER — CITALOPRAM HYDROBROMIDE 20 MG PO TABS: 20.0000 mg | ORAL_TABLET | Freq: Every day | ORAL | Status: DC

## 2013-12-13 NOTE — Progress Notes (Signed)
Pre visit review using our clinic review tool, if applicable. No additional management support is needed unless otherwise documented below in the visit note. 

## 2013-12-13 NOTE — Assessment & Plan Note (Signed)
No withdrawal off the citalopram for a week but more moody  Will have her restart

## 2013-12-13 NOTE — Progress Notes (Signed)
Subjective:    Patient ID: [name], female    DOB: 12/25/1945, 68 y.o.   MRN: [id number]  HPI Here with daughter  No problems with the donepezil No stomach trouble Appetite is fine No bowel issues  She and husband, and daughter don't note any differences in cognition or performance  Current Outpatient Prescriptions on File Prior to Visit  Medication Sig Dispense Refill  . albuterol (PROAIR HFA) 108 (90 BASE) MCG/ACT inhaler Inhale 2 puffs into the lungs every 6 (six) hours as needed for wheezing or shortness of breath.      Marland Kitchen atorvastatin (LIPITOR) 40 MG tablet Take 1 tablet (40 mg total) by mouth daily.  90 tablet  3  . Cholecalciferol (VITAMIN D3) 5000 UNITS CAPS Take 1 capsule by mouth every morning.      . citalopram (CELEXA) 20 MG tablet TAKE 1 TABLET DAILY OR AS  DIRECTED  90 tablet  0  . donepezil (ARICEPT) 5 MG tablet Take 1 tablet (5 mg total) by mouth at bedtime.  30 tablet  3  . famotidine (PEPCID) 20 MG tablet Take 20 mg by mouth at bedtime as needed (for flare of cough).      . fexofenadine (ALLEGRA) 180 MG tablet Take 180 mg by mouth daily as needed (nasal drainage).      . fluticasone (FLONASE) 50 MCG/ACT nasal spray USE 2 SPRAYS NASALLY DAILY  48 g  1  . furosemide (LASIX) 20 MG tablet TAKE 1 TABLET BY MOUTH DAILY AS NEEDED FOR EDEMA  30 tablet  0  . irbesartan (AVAPRO) 150 MG tablet Take 150 mg by mouth daily.      Marland Kitchen L-Methylfolate-Algae-B12-B6 (METANX) 3-90.314-2-35 MG CAPS Take 180 mg by mouth 2 (two) times daily.      Marland Kitchen LYRICA 75 MG capsule Take 75 mg by mouth 3 (three) times daily.       . metformin (FORTAMET) 1000 MG (OSM) 24 hr tablet TAKE 1 TABLET TWICE A DAY  180 tablet  3  . metoCLOPramide (REGLAN) 5 MG tablet Take 1 tablet (5 mg total) by mouth 4 (four) times daily -  before meals and at bedtime.  120 tablet  3  . mometasone-formoterol (DULERA) 200-5 MCG/ACT AERO Take 2 puffs first thing in am and then another 2 puffs about 12 hours later.  3  Inhaler  3  . montelukast (SINGULAIR) 10 MG tablet Take 1 tablet (10 mg total) by mouth daily.  30 tablet  11  . Multiple Vitamin (MULTIVITAMIN) tablet Take 1 tablet by mouth every morning.       . Omega-3 Fatty Acids (FISH OIL) 1200 MG CAPS Take 1 capsule by mouth every morning. (place in the freezer)      . omeprazole (PRILOSEC) 20 MG capsule Take 1 capsule (20 mg total) by mouth daily as needed.  90 capsule  3  . polyethylene glycol powder (GLYCOLAX/MIRALAX) powder Take 17 grams (1 capful) every morning and 9 grams (1/2 capful) every evening  850 g  10  . tiotropium (SPIRIVA) 18 MCG inhalation capsule Place 1 capsule (18 mcg total) into inhaler and inhale daily.  90 capsule  3  . vitamin C (ASCORBIC ACID) 500 MG tablet Take 500 mg by mouth every morning.       No current facility-administered medications on file prior to visit.    Allergies  Allergen Reactions  . Budesonide-Formoterol Fumarate Other (See Comments)    REACTION: coughed worse  . Penicillins Rash  REACTION: rash    Past Medical History  Diagnosis Date  . Renal cell carcinoma   . Asthma   . Diabetes mellitus type II   . GERD (gastroesophageal reflux disease)   . Gastroparesis   . Diverticulosis   . Anxiety   . Depression   . History of breast cancer   . History of adenomatous polyp of colon   . Hyperlipidemia   . COPD (chronic obstructive pulmonary disease)   . Pulmonary nodule     multiple  . Osteoarthritis   . Carcinoid tumor of lung 1991  . Mild cognitive impairment     Past Surgical History  Procedure Laterality Date  . Cholecystectomy  1975  . Tonsillectomy and adenoidectomy  1953  . Breast lumpectomy  1998    right breast  . Lung surgery  1994    carcinoid removal  . Laparoscopic nephrectomy  01/2009    right    Family History  Problem Relation Age of Onset  . Colon cancer Father   . Lung cancer Father   . Stroke Mother     History   Social History  . Marital Status: Married     Spouse Name: N/A    Number of Children: 2  . Years of Education: N/A   Occupational History  . retired Science writer    Social History Main Topics  . Smoking status: Former Smoker -- 1.00 packs/day for 30 years    Types: Cigarettes    Quit date: 03/08/1993  . Smokeless tobacco: Never Used  . Alcohol Use: No  . Drug Use: No  . Sexual Activity: Not on file   Other Topics Concern  . Not on file   Social History Narrative   No living will   Requests husband as health care POA   Would accept attempts at resuscitation   No tube feeds if cognitively unaware            Review of Systems Sleeps okay Some moodiness--ran out of the citalopram about a week ago    Objective:   Physical Exam  Constitutional: She appears well-developed and well-nourished. No distress.  Neurological:  Normal interaction   Psychiatric: She has a normal mood and affect. Her behavior is normal.  Mood is fine here Appropriate affect          Assessment & Plan:

## 2013-12-13 NOTE — Assessment & Plan Note (Signed)
No improvement on donepezil but no problems Discussed options Will increase to 10mg  daily Reevaluate in 4 months and stop if no improvement (with restart if notices a change)

## 2013-12-17 ENCOUNTER — Other Ambulatory Visit: Payer: Self-pay

## 2013-12-17 MED ORDER — ALBUTEROL SULFATE HFA 108 (90 BASE) MCG/ACT IN AERS: 2.0000 | INHALATION_SPRAY | Freq: Four times a day (QID) | RESPIRATORY_TRACT | Status: DC | PRN

## 2013-12-17 NOTE — Telephone Encounter (Signed)
Pt left v/m requesting refill prn proair HFA to [facility].Please advise.

## 2013-12-18 NOTE — Telephone Encounter (Signed)
Pt called for status of proair refill; advised sent electronically to [place] voiced understanding.

## 2013-12-21 ENCOUNTER — Other Ambulatory Visit: Payer: Self-pay

## 2014-01-08 ENCOUNTER — Other Ambulatory Visit: Payer: Self-pay

## 2014-01-08 DIAGNOSIS — Z1231 Encounter for screening mammogram for malignant neoplasm of breast: Secondary | ICD-10-CM

## 2014-01-29 ENCOUNTER — Other Ambulatory Visit: Payer: Self-pay | Admitting: Internal Medicine

## 2014-02-13 ENCOUNTER — Ambulatory Visit: Payer: Medicare Other

## 2014-02-15 ENCOUNTER — Ambulatory Visit: Payer: Medicare Other | Admitting: Internal Medicine

## 2014-03-06 ENCOUNTER — Other Ambulatory Visit: Payer: Self-pay | Admitting: Internal Medicine

## 2014-03-06 ENCOUNTER — Encounter: Payer: Self-pay | Admitting: Internal Medicine

## 2014-03-06 NOTE — Telephone Encounter (Signed)
Probably not This is usually more expensive Okay to do 1 year but confirm which she takes

## 2014-03-06 NOTE — Telephone Encounter (Signed)
Is this the correct Metformin?

## 2014-03-06 NOTE — Telephone Encounter (Signed)
Spoke with patient and she will check with her endocrinologist to see what he wants her to take, pt states this is sent to the mail order and is only $10.00 per her husband.

## 2014-03-07 ENCOUNTER — Ambulatory Visit: Payer: Medicare Other

## 2014-03-08 NOTE — Telephone Encounter (Signed)
Okay to send for a year once the proper formulation is confirmed

## 2014-03-11 ENCOUNTER — Emergency Department: Payer: Self-pay | Admitting: Emergency Medicine

## 2014-03-11 DIAGNOSIS — Z880 Allergy status to penicillin: Secondary | ICD-10-CM | POA: Diagnosis not present

## 2014-03-11 DIAGNOSIS — I10 Essential (primary) hypertension: Secondary | ICD-10-CM | POA: Diagnosis not present

## 2014-03-11 DIAGNOSIS — J209 Acute bronchitis, unspecified: Secondary | ICD-10-CM | POA: Diagnosis not present

## 2014-03-11 DIAGNOSIS — E119 Type 2 diabetes mellitus without complications: Secondary | ICD-10-CM | POA: Diagnosis not present

## 2014-03-11 DIAGNOSIS — Z87891 Personal history of nicotine dependence: Secondary | ICD-10-CM | POA: Diagnosis not present

## 2014-03-11 DIAGNOSIS — J441 Chronic obstructive pulmonary disease with (acute) exacerbation: Secondary | ICD-10-CM | POA: Diagnosis not present

## 2014-03-11 DIAGNOSIS — R0602 Shortness of breath: Secondary | ICD-10-CM | POA: Diagnosis not present

## 2014-03-11 DIAGNOSIS — Z79899 Other long term (current) drug therapy: Secondary | ICD-10-CM | POA: Diagnosis not present

## 2014-03-11 LAB — URINALYSIS, COMPLETE
BACTERIA: NONE SEEN
BILIRUBIN, UR: NEGATIVE
Blood: NEGATIVE
GLUCOSE, UR: NEGATIVE mg/dL (ref 0–75)
Ketone: NEGATIVE
Nitrite: NEGATIVE
PH: 5 (ref 4.5–8.0)
Protein: NEGATIVE
Specific Gravity: 1.012 (ref 1.003–1.030)
Squamous Epithelial: 2

## 2014-03-11 LAB — COMPREHENSIVE METABOLIC PANEL
ALK PHOS: 83 U/L
ALT: 32 U/L
Albumin: 3.2 g/dL — ABNORMAL LOW (ref 3.4–5.0)
Anion Gap: 8 (ref 7–16)
BUN: 17 mg/dL (ref 7–18)
Bilirubin,Total: 0.3 mg/dL (ref 0.2–1.0)
CALCIUM: 9.5 mg/dL (ref 8.5–10.1)
CREATININE: 1.25 mg/dL (ref 0.60–1.30)
Chloride: 105 mmol/L (ref 98–107)
Co2: 27 mmol/L (ref 21–32)
EGFR (African American): 55 — ABNORMAL LOW
GFR CALC NON AF AMER: 45 — AB
Glucose: 132 mg/dL — ABNORMAL HIGH (ref 65–99)
Osmolality: 283 (ref 275–301)
Potassium: 4.5 mmol/L (ref 3.5–5.1)
SGOT(AST): 31 U/L (ref 15–37)
Sodium: 140 mmol/L (ref 136–145)
Total Protein: 7.4 g/dL (ref 6.4–8.2)

## 2014-03-11 LAB — CBC WITH DIFFERENTIAL/PLATELET
BASOS ABS: 0.1 10*3/uL (ref 0.0–0.1)
Basophil %: 0.6 %
EOS PCT: 1.8 %
Eosinophil #: 0.2 10*3/uL (ref 0.0–0.7)
HCT: 40.6 % (ref 35.0–47.0)
HGB: 13.1 g/dL (ref 12.0–16.0)
LYMPHS ABS: 3.3 10*3/uL (ref 1.0–3.6)
LYMPHS PCT: 26.5 %
MCH: 29.8 pg (ref 26.0–34.0)
MCHC: 32.2 g/dL (ref 32.0–36.0)
MCV: 93 fL (ref 80–100)
MONOS PCT: 5.1 %
Monocyte #: 0.6 x10 3/mm (ref 0.2–0.9)
Neutrophil #: 8.1 10*3/uL — ABNORMAL HIGH (ref 1.4–6.5)
Neutrophil %: 66 %
PLATELETS: 293 10*3/uL (ref 150–440)
RBC: 4.38 10*6/uL (ref 3.80–5.20)
RDW: 15.6 % — AB (ref 11.5–14.5)
WBC: 12.4 10*3/uL — AB (ref 3.6–11.0)

## 2014-03-11 LAB — TROPONIN I

## 2014-03-11 LAB — LIPASE, BLOOD: LIPASE: 86 U/L (ref 73–393)

## 2014-03-11 LAB — MAGNESIUM: Magnesium: 1.5 mg/dL — ABNORMAL LOW

## 2014-03-11 MED ORDER — METFORMIN HCL ER (OSM) 1000 MG PO TB24: 1000.0000 mg | ORAL_TABLET | Freq: Two times a day (BID) | ORAL | Status: DC

## 2014-03-13 LAB — URINE CULTURE

## 2014-03-18 ENCOUNTER — Emergency Department: Payer: Self-pay | Admitting: Emergency Medicine

## 2014-03-18 DIAGNOSIS — R040 Epistaxis: Secondary | ICD-10-CM | POA: Diagnosis not present

## 2014-03-18 DIAGNOSIS — D72829 Elevated white blood cell count, unspecified: Secondary | ICD-10-CM | POA: Diagnosis not present

## 2014-03-18 DIAGNOSIS — R0989 Other specified symptoms and signs involving the circulatory and respiratory systems: Secondary | ICD-10-CM | POA: Diagnosis not present

## 2014-03-18 DIAGNOSIS — E119 Type 2 diabetes mellitus without complications: Secondary | ICD-10-CM | POA: Diagnosis not present

## 2014-03-18 DIAGNOSIS — R05 Cough: Secondary | ICD-10-CM | POA: Diagnosis not present

## 2014-03-18 DIAGNOSIS — R0602 Shortness of breath: Secondary | ICD-10-CM | POA: Diagnosis not present

## 2014-03-18 DIAGNOSIS — R918 Other nonspecific abnormal finding of lung field: Secondary | ICD-10-CM | POA: Diagnosis not present

## 2014-03-18 DIAGNOSIS — Z79899 Other long term (current) drug therapy: Secondary | ICD-10-CM | POA: Diagnosis not present

## 2014-03-18 DIAGNOSIS — Z87891 Personal history of nicotine dependence: Secondary | ICD-10-CM | POA: Diagnosis not present

## 2014-03-18 DIAGNOSIS — Z880 Allergy status to penicillin: Secondary | ICD-10-CM | POA: Diagnosis not present

## 2014-03-18 LAB — CBC WITH DIFFERENTIAL/PLATELET
BASOS PCT: 0.4 %
Basophil #: 0.1 10*3/uL (ref 0.0–0.1)
EOS PCT: 1.3 %
Eosinophil #: 0.3 10*3/uL (ref 0.0–0.7)
HCT: 37.1 % (ref 35.0–47.0)
HGB: 11.5 g/dL — ABNORMAL LOW (ref 12.0–16.0)
LYMPHS ABS: 6.6 10*3/uL — AB (ref 1.0–3.6)
Lymphocyte %: 25.4 %
MCH: 28.9 pg (ref 26.0–34.0)
MCHC: 30.9 g/dL — AB (ref 32.0–36.0)
MCV: 94 fL (ref 80–100)
MONO ABS: 1.2 x10 3/mm — AB (ref 0.2–0.9)
Monocyte %: 4.8 %
Neutrophil #: 17.8 10*3/uL — ABNORMAL HIGH (ref 1.4–6.5)
Neutrophil %: 68.1 %
PLATELETS: 376 10*3/uL (ref 150–440)
RBC: 3.96 10*6/uL (ref 3.80–5.20)
RDW: 15.8 % — ABNORMAL HIGH (ref 11.5–14.5)
WBC: 26 10*3/uL — ABNORMAL HIGH (ref 3.6–11.0)

## 2014-03-18 LAB — COMPREHENSIVE METABOLIC PANEL
ALBUMIN: 2.8 g/dL — AB (ref 3.4–5.0)
ALT: 26 U/L
Alkaline Phosphatase: 50 U/L
Anion Gap: 11 (ref 7–16)
BILIRUBIN TOTAL: 0.4 mg/dL (ref 0.2–1.0)
BUN: 31 mg/dL — ABNORMAL HIGH (ref 7–18)
CALCIUM: 8.8 mg/dL (ref 8.5–10.1)
CO2: 25 mmol/L (ref 21–32)
Chloride: 103 mmol/L (ref 98–107)
Creatinine: 1.27 mg/dL (ref 0.60–1.30)
EGFR (Non-African Amer.): 44 — ABNORMAL LOW
GFR CALC AF AMER: 54 — AB
Glucose: 165 mg/dL — ABNORMAL HIGH (ref 65–99)
Osmolality: 288 (ref 275–301)
Potassium: 4.7 mmol/L (ref 3.5–5.1)
SGOT(AST): 16 U/L (ref 15–37)
Sodium: 139 mmol/L (ref 136–145)
TOTAL PROTEIN: 6.2 g/dL — AB (ref 6.4–8.2)

## 2014-03-18 LAB — PROTIME-INR
INR: 1
PROTHROMBIN TIME: 12.8 s (ref 11.5–14.7)

## 2014-03-20 ENCOUNTER — Ambulatory Visit: Payer: Medicare Other

## 2014-03-21 DIAGNOSIS — R040 Epistaxis: Secondary | ICD-10-CM | POA: Diagnosis not present

## 2014-03-21 DIAGNOSIS — J45991 Cough variant asthma: Secondary | ICD-10-CM | POA: Diagnosis not present

## 2014-03-28 ENCOUNTER — Ambulatory Visit: Payer: Medicare Other

## 2014-04-08 DIAGNOSIS — J45991 Cough variant asthma: Secondary | ICD-10-CM | POA: Diagnosis not present

## 2014-04-08 DIAGNOSIS — H6123 Impacted cerumen, bilateral: Secondary | ICD-10-CM | POA: Diagnosis not present

## 2014-04-08 DIAGNOSIS — H903 Sensorineural hearing loss, bilateral: Secondary | ICD-10-CM | POA: Diagnosis not present

## 2014-04-08 DIAGNOSIS — R070 Pain in throat: Secondary | ICD-10-CM | POA: Diagnosis not present

## 2014-04-13 ENCOUNTER — Inpatient Hospital Stay: Payer: Self-pay | Admitting: Internal Medicine

## 2014-04-13 DIAGNOSIS — Z87891 Personal history of nicotine dependence: Secondary | ICD-10-CM | POA: Diagnosis not present

## 2014-04-13 DIAGNOSIS — D509 Iron deficiency anemia, unspecified: Secondary | ICD-10-CM | POA: Diagnosis not present

## 2014-04-13 DIAGNOSIS — K921 Melena: Secondary | ICD-10-CM | POA: Diagnosis not present

## 2014-04-13 DIAGNOSIS — J45909 Unspecified asthma, uncomplicated: Secondary | ICD-10-CM | POA: Diagnosis not present

## 2014-04-13 DIAGNOSIS — D649 Anemia, unspecified: Secondary | ICD-10-CM | POA: Diagnosis not present

## 2014-04-13 DIAGNOSIS — K5732 Diverticulitis of large intestine without perforation or abscess without bleeding: Secondary | ICD-10-CM | POA: Diagnosis not present

## 2014-04-13 DIAGNOSIS — R0602 Shortness of breath: Secondary | ICD-10-CM | POA: Diagnosis not present

## 2014-04-13 DIAGNOSIS — K2940 Chronic atrophic gastritis without bleeding: Secondary | ICD-10-CM | POA: Diagnosis not present

## 2014-04-13 DIAGNOSIS — D124 Benign neoplasm of descending colon: Secondary | ICD-10-CM | POA: Diagnosis not present

## 2014-04-13 DIAGNOSIS — R079 Chest pain, unspecified: Secondary | ICD-10-CM | POA: Diagnosis not present

## 2014-04-13 DIAGNOSIS — Z85118 Personal history of other malignant neoplasm of bronchus and lung: Secondary | ICD-10-CM | POA: Diagnosis not present

## 2014-04-13 DIAGNOSIS — E785 Hyperlipidemia, unspecified: Secondary | ICD-10-CM | POA: Diagnosis present

## 2014-04-13 DIAGNOSIS — R195 Other fecal abnormalities: Secondary | ICD-10-CM | POA: Diagnosis not present

## 2014-04-13 DIAGNOSIS — Z91048 Other nonmedicinal substance allergy status: Secondary | ICD-10-CM | POA: Diagnosis not present

## 2014-04-13 DIAGNOSIS — I10 Essential (primary) hypertension: Secondary | ICD-10-CM | POA: Diagnosis not present

## 2014-04-13 DIAGNOSIS — K922 Gastrointestinal hemorrhage, unspecified: Secondary | ICD-10-CM | POA: Diagnosis not present

## 2014-04-13 DIAGNOSIS — G629 Polyneuropathy, unspecified: Secondary | ICD-10-CM | POA: Diagnosis present

## 2014-04-13 DIAGNOSIS — J80 Acute respiratory distress syndrome: Secondary | ICD-10-CM | POA: Diagnosis not present

## 2014-04-13 DIAGNOSIS — E1142 Type 2 diabetes mellitus with diabetic polyneuropathy: Secondary | ICD-10-CM | POA: Diagnosis not present

## 2014-04-13 DIAGNOSIS — Z880 Allergy status to penicillin: Secondary | ICD-10-CM | POA: Diagnosis not present

## 2014-04-13 DIAGNOSIS — K5730 Diverticulosis of large intestine without perforation or abscess without bleeding: Secondary | ICD-10-CM | POA: Diagnosis not present

## 2014-04-13 DIAGNOSIS — F0390 Unspecified dementia without behavioral disturbance: Secondary | ICD-10-CM | POA: Diagnosis not present

## 2014-04-13 DIAGNOSIS — K2970 Gastritis, unspecified, without bleeding: Secondary | ICD-10-CM | POA: Diagnosis present

## 2014-04-13 DIAGNOSIS — J9600 Acute respiratory failure, unspecified whether with hypoxia or hypercapnia: Secondary | ICD-10-CM | POA: Diagnosis not present

## 2014-04-13 DIAGNOSIS — R911 Solitary pulmonary nodule: Secondary | ICD-10-CM | POA: Diagnosis not present

## 2014-04-13 DIAGNOSIS — J441 Chronic obstructive pulmonary disease with (acute) exacerbation: Secondary | ICD-10-CM | POA: Diagnosis present

## 2014-04-13 DIAGNOSIS — Z853 Personal history of malignant neoplasm of breast: Secondary | ICD-10-CM | POA: Diagnosis not present

## 2014-04-13 DIAGNOSIS — D123 Benign neoplasm of transverse colon: Secondary | ICD-10-CM | POA: Diagnosis present

## 2014-04-13 DIAGNOSIS — F329 Major depressive disorder, single episode, unspecified: Secondary | ICD-10-CM | POA: Diagnosis present

## 2014-04-13 DIAGNOSIS — E119 Type 2 diabetes mellitus without complications: Secondary | ICD-10-CM | POA: Diagnosis not present

## 2014-04-13 DIAGNOSIS — J40 Bronchitis, not specified as acute or chronic: Secondary | ICD-10-CM | POA: Diagnosis not present

## 2014-04-13 LAB — COMPREHENSIVE METABOLIC PANEL
Albumin: 2.3 g/dL — ABNORMAL LOW (ref 3.4–5.0)
Alkaline Phosphatase: 53 U/L (ref 46–116)
Anion Gap: 6 — ABNORMAL LOW (ref 7–16)
BUN: 15 mg/dL (ref 7–18)
Bilirubin,Total: 0.2 mg/dL (ref 0.2–1.0)
CHLORIDE: 102 mmol/L (ref 98–107)
CO2: 30 mmol/L (ref 21–32)
CREATININE: 1.32 mg/dL — AB (ref 0.60–1.30)
Calcium, Total: 8.7 mg/dL (ref 8.5–10.1)
EGFR (African American): 51 — ABNORMAL LOW
GFR CALC NON AF AMER: 42 — AB
Glucose: 314 mg/dL — ABNORMAL HIGH (ref 65–99)
OSMOLALITY: 288 (ref 275–301)
POTASSIUM: 4.1 mmol/L (ref 3.5–5.1)
SGOT(AST): 14 U/L — ABNORMAL LOW (ref 15–37)
SGPT (ALT): 30 U/L (ref 14–63)
SODIUM: 138 mmol/L (ref 136–145)
Total Protein: 5.9 g/dL — ABNORMAL LOW (ref 6.4–8.2)

## 2014-04-13 LAB — IRON AND TIBC
Iron Bind.Cap.(Total): 263 ug/dL (ref 250–450)
Iron Saturation: 6 %
Iron: 16 ug/dL — ABNORMAL LOW (ref 50–170)
Unbound Iron-Bind.Cap.: 247 ug/dL

## 2014-04-13 LAB — CBC
HCT: 26.6 % — AB (ref 35.0–47.0)
HGB: 8.3 g/dL — ABNORMAL LOW (ref 12.0–16.0)
MCH: 28.1 pg (ref 26.0–34.0)
MCHC: 31.1 g/dL — AB (ref 32.0–36.0)
MCV: 90 fL (ref 80–100)
Platelet: 213 10*3/uL (ref 150–440)
RBC: 2.95 10*6/uL — ABNORMAL LOW (ref 3.80–5.20)
RDW: 16.9 % — AB (ref 11.5–14.5)
WBC: 7.2 10*3/uL (ref 3.6–11.0)

## 2014-04-13 LAB — FERRITIN: FERRITIN (ARMC): 37 ng/mL (ref 8–388)

## 2014-04-13 LAB — TROPONIN I: Troponin-I: <0.02 ng/mL

## 2014-04-13 LAB — CK TOTAL AND CKMB (NOT AT ARMC)
CK, Total: 32 U/L (ref 26–192)
CK-MB: 1.5 ng/mL (ref 0.5–3.6)

## 2014-04-13 LAB — PRO B NATRIURETIC PEPTIDE: B-Type Natriuretic Peptide: 699 pg/mL — ABNORMAL HIGH (ref 0–125)

## 2014-04-14 DIAGNOSIS — D509 Iron deficiency anemia, unspecified: Secondary | ICD-10-CM | POA: Diagnosis not present

## 2014-04-14 DIAGNOSIS — R195 Other fecal abnormalities: Secondary | ICD-10-CM | POA: Diagnosis not present

## 2014-04-14 DIAGNOSIS — R0602 Shortness of breath: Secondary | ICD-10-CM | POA: Diagnosis not present

## 2014-04-14 DIAGNOSIS — J441 Chronic obstructive pulmonary disease with (acute) exacerbation: Secondary | ICD-10-CM | POA: Diagnosis not present

## 2014-04-14 LAB — CBC WITH DIFFERENTIAL/PLATELET
Basophil #: 0 10*3/uL (ref 0.0–0.1)
Basophil %: 0.2 %
EOS PCT: 0.1 %
Eosinophil #: 0 10*3/uL (ref 0.0–0.7)
HCT: 24.3 % — ABNORMAL LOW (ref 35.0–47.0)
HGB: 7.7 g/dL — ABNORMAL LOW (ref 12.0–16.0)
LYMPHS ABS: 0.9 10*3/uL — AB (ref 1.0–3.6)
Lymphocyte %: 15.2 %
MCH: 28.4 pg (ref 26.0–34.0)
MCHC: 31.9 g/dL — ABNORMAL LOW (ref 32.0–36.0)
MCV: 89 fL (ref 80–100)
MONOS PCT: 2.5 %
Monocyte #: 0.1 x10 3/mm — ABNORMAL LOW (ref 0.2–0.9)
Neutrophil #: 4.9 10*3/uL (ref 1.4–6.5)
Neutrophil %: 82 %
PLATELETS: 220 10*3/uL (ref 150–440)
RBC: 2.72 10*6/uL — ABNORMAL LOW (ref 3.80–5.20)
RDW: 16.7 % — ABNORMAL HIGH (ref 11.5–14.5)
WBC: 6 10*3/uL (ref 3.6–11.0)

## 2014-04-14 LAB — BASIC METABOLIC PANEL
ANION GAP: 7 (ref 7–16)
BUN: 17 mg/dL (ref 7–18)
CO2: 27 mmol/L (ref 21–32)
CREATININE: 1.28 mg/dL (ref 0.60–1.30)
Calcium, Total: 8.7 mg/dL (ref 8.5–10.1)
Chloride: 105 mmol/L (ref 98–107)
EGFR (African American): 53 — ABNORMAL LOW
GFR CALC NON AF AMER: 44 — AB
Glucose: 181 mg/dL — ABNORMAL HIGH (ref 65–99)
Osmolality: 284 (ref 275–301)
Potassium: 5.1 mmol/L (ref 3.5–5.1)
Sodium: 139 mmol/L (ref 136–145)

## 2014-04-15 ENCOUNTER — Encounter: Payer: Self-pay | Admitting: *Deleted

## 2014-04-15 ENCOUNTER — Telehealth: Payer: Self-pay | Admitting: Internal Medicine

## 2014-04-15 LAB — CULTURE, BLOOD (SINGLE)

## 2014-04-15 LAB — CBC WITH DIFFERENTIAL/PLATELET
BASOS ABS: 0 10*3/uL (ref 0.0–0.1)
Basophil %: 0 %
Eosinophil #: 0 10*3/uL (ref 0.0–0.7)
Eosinophil %: 0 %
HCT: 23.6 % — ABNORMAL LOW (ref 35.0–47.0)
HGB: 7.5 g/dL — AB (ref 12.0–16.0)
LYMPHS ABS: 1.6 10*3/uL (ref 1.0–3.6)
Lymphocyte %: 15 %
MCH: 28.4 pg (ref 26.0–34.0)
MCHC: 32 g/dL (ref 32.0–36.0)
MCV: 89 fL (ref 80–100)
MONO ABS: 0.4 x10 3/mm (ref 0.2–0.9)
Monocyte %: 3.7 %
NEUTROS PCT: 81.3 %
Neutrophil #: 8.7 10*3/uL — ABNORMAL HIGH (ref 1.4–6.5)
PLATELETS: 284 10*3/uL (ref 150–440)
RBC: 2.65 10*6/uL — AB (ref 3.80–5.20)
RDW: 16.6 % — AB (ref 11.5–14.5)
WBC: 10.7 10*3/uL (ref 3.6–11.0)

## 2014-04-15 LAB — HM COLONOSCOPY: HM COLON: NORMAL

## 2014-04-15 LAB — OCCULT BLOOD X 1 CARD TO LAB, STOOL: Occult Blood, Feces: NEGATIVE

## 2014-04-15 NOTE — Telephone Encounter (Signed)
Spoke with patient's daughter and reassured her that EGD would be usual procedure done to help with diagnosing why her mother is anemic. She states her mother may decide to transfer to [place].

## 2014-04-15 NOTE — Telephone Encounter (Signed)
Patient's daughter has EGD and COLON reports at [facility]. She will fax them to Dr. Olevia Perches.

## 2014-04-16 ENCOUNTER — Telehealth: Payer: Self-pay

## 2014-04-16 ENCOUNTER — Other Ambulatory Visit: Payer: Self-pay | Admitting: *Deleted

## 2014-04-16 DIAGNOSIS — R911 Solitary pulmonary nodule: Secondary | ICD-10-CM

## 2014-04-16 NOTE — Telephone Encounter (Signed)
[place] with [facility] left v/m; pt is going to be seen by home health and home health needs lab results for A1C in last 90 days.Please advise.

## 2014-04-17 ENCOUNTER — Telehealth: Payer: Self-pay | Admitting: Internal Medicine

## 2014-04-17 NOTE — Telephone Encounter (Signed)
Did make it home yesterday. Spoke to husband and patient---she is tired but feeling a bit better Home health has been set up She has a follow up appt 2/22---they will call with any issues before then. Husband will drop off the packet from [facility] with records

## 2014-04-17 NOTE — Telephone Encounter (Signed)
I spoke to [facility], and she said it would be fine to wait until patient's appointment on 04/29/14. She asked for the lab report to be faxed to her at [phone].  Lashaye's phone number is [phone]. I cancelled patient's appointment on 05/01/14.  I faxed a records request to [facility] for the discharge summary.

## 2014-04-17 NOTE — Telephone Encounter (Signed)
Thanks I just spoke to the patient and her husband also and made sure they knew to come on 2/22

## 2014-04-17 NOTE — Telephone Encounter (Signed)
See if they can wait until her visit her 2/22--- I have to draw all her labs at that time If they can't, I guess they can draw it but it is really silly to do this extra blood draw.  Please get her discharge summary from [facility] the appt 2/24--- I can do her hospital follow up with the wellness visit on 2/22

## 2014-04-18 DIAGNOSIS — K922 Gastrointestinal hemorrhage, unspecified: Secondary | ICD-10-CM | POA: Diagnosis not present

## 2014-04-18 DIAGNOSIS — J441 Chronic obstructive pulmonary disease with (acute) exacerbation: Secondary | ICD-10-CM | POA: Diagnosis not present

## 2014-04-18 DIAGNOSIS — E1140 Type 2 diabetes mellitus with diabetic neuropathy, unspecified: Secondary | ICD-10-CM | POA: Diagnosis not present

## 2014-04-18 DIAGNOSIS — G629 Polyneuropathy, unspecified: Secondary | ICD-10-CM | POA: Diagnosis not present

## 2014-04-18 DIAGNOSIS — I10 Essential (primary) hypertension: Secondary | ICD-10-CM | POA: Diagnosis not present

## 2014-04-18 DIAGNOSIS — F0390 Unspecified dementia without behavioral disturbance: Secondary | ICD-10-CM | POA: Diagnosis not present

## 2014-04-18 DIAGNOSIS — D649 Anemia, unspecified: Secondary | ICD-10-CM | POA: Diagnosis not present

## 2014-04-18 DIAGNOSIS — F329 Major depressive disorder, single episode, unspecified: Secondary | ICD-10-CM | POA: Diagnosis not present

## 2014-04-18 DIAGNOSIS — Z87891 Personal history of nicotine dependence: Secondary | ICD-10-CM | POA: Diagnosis not present

## 2014-04-18 DIAGNOSIS — Z85110 Personal history of malignant carcinoid tumor of bronchus and lung: Secondary | ICD-10-CM | POA: Diagnosis not present

## 2014-04-22 DIAGNOSIS — I10 Essential (primary) hypertension: Secondary | ICD-10-CM | POA: Diagnosis not present

## 2014-04-22 DIAGNOSIS — J441 Chronic obstructive pulmonary disease with (acute) exacerbation: Secondary | ICD-10-CM | POA: Diagnosis not present

## 2014-04-22 DIAGNOSIS — K922 Gastrointestinal hemorrhage, unspecified: Secondary | ICD-10-CM | POA: Diagnosis not present

## 2014-04-22 DIAGNOSIS — F0390 Unspecified dementia without behavioral disturbance: Secondary | ICD-10-CM | POA: Diagnosis not present

## 2014-04-22 DIAGNOSIS — D649 Anemia, unspecified: Secondary | ICD-10-CM | POA: Diagnosis not present

## 2014-04-22 DIAGNOSIS — E1140 Type 2 diabetes mellitus with diabetic neuropathy, unspecified: Secondary | ICD-10-CM | POA: Diagnosis not present

## 2014-04-23 DIAGNOSIS — J441 Chronic obstructive pulmonary disease with (acute) exacerbation: Secondary | ICD-10-CM | POA: Diagnosis not present

## 2014-04-23 DIAGNOSIS — F0390 Unspecified dementia without behavioral disturbance: Secondary | ICD-10-CM | POA: Diagnosis not present

## 2014-04-23 DIAGNOSIS — K922 Gastrointestinal hemorrhage, unspecified: Secondary | ICD-10-CM | POA: Diagnosis not present

## 2014-04-23 DIAGNOSIS — I10 Essential (primary) hypertension: Secondary | ICD-10-CM | POA: Diagnosis not present

## 2014-04-23 DIAGNOSIS — E1140 Type 2 diabetes mellitus with diabetic neuropathy, unspecified: Secondary | ICD-10-CM | POA: Diagnosis not present

## 2014-04-23 DIAGNOSIS — D649 Anemia, unspecified: Secondary | ICD-10-CM | POA: Diagnosis not present

## 2014-04-23 DIAGNOSIS — K921 Melena: Secondary | ICD-10-CM | POA: Diagnosis not present

## 2014-04-24 ENCOUNTER — Encounter: Payer: Self-pay | Admitting: Internal Medicine

## 2014-04-25 ENCOUNTER — Encounter: Payer: Self-pay | Admitting: Internal Medicine

## 2014-04-25 DIAGNOSIS — E1140 Type 2 diabetes mellitus with diabetic neuropathy, unspecified: Secondary | ICD-10-CM | POA: Diagnosis not present

## 2014-04-25 DIAGNOSIS — D649 Anemia, unspecified: Secondary | ICD-10-CM | POA: Diagnosis not present

## 2014-04-25 DIAGNOSIS — K922 Gastrointestinal hemorrhage, unspecified: Secondary | ICD-10-CM | POA: Diagnosis not present

## 2014-04-25 DIAGNOSIS — I10 Essential (primary) hypertension: Secondary | ICD-10-CM | POA: Diagnosis not present

## 2014-04-25 DIAGNOSIS — F0390 Unspecified dementia without behavioral disturbance: Secondary | ICD-10-CM | POA: Diagnosis not present

## 2014-04-25 DIAGNOSIS — J441 Chronic obstructive pulmonary disease with (acute) exacerbation: Secondary | ICD-10-CM | POA: Diagnosis not present

## 2014-04-26 ENCOUNTER — Encounter: Payer: Medicare Other | Admitting: Internal Medicine

## 2014-04-29 ENCOUNTER — Encounter: Payer: Self-pay | Admitting: Internal Medicine

## 2014-04-29 ENCOUNTER — Ambulatory Visit (INDEPENDENT_AMBULATORY_CARE_PROVIDER_SITE_OTHER): Payer: Medicare Other | Admitting: Internal Medicine

## 2014-04-29 ENCOUNTER — Telehealth: Payer: Self-pay | Admitting: Internal Medicine

## 2014-04-29 VITALS — BP 100/50 | HR 85 | Temp 97.6°F | Ht 62.25 in | Wt 137.8 lb

## 2014-04-29 DIAGNOSIS — G3184 Mild cognitive impairment, so stated: Secondary | ICD-10-CM

## 2014-04-29 DIAGNOSIS — E1140 Type 2 diabetes mellitus with diabetic neuropathy, unspecified: Secondary | ICD-10-CM | POA: Diagnosis not present

## 2014-04-29 DIAGNOSIS — Z0000 Encounter for general adult medical examination without abnormal findings: Secondary | ICD-10-CM

## 2014-04-29 DIAGNOSIS — J449 Chronic obstructive pulmonary disease, unspecified: Secondary | ICD-10-CM | POA: Diagnosis not present

## 2014-04-29 DIAGNOSIS — D649 Anemia, unspecified: Secondary | ICD-10-CM | POA: Diagnosis not present

## 2014-04-29 DIAGNOSIS — I10 Essential (primary) hypertension: Secondary | ICD-10-CM | POA: Diagnosis not present

## 2014-04-29 DIAGNOSIS — J441 Chronic obstructive pulmonary disease with (acute) exacerbation: Secondary | ICD-10-CM | POA: Diagnosis not present

## 2014-04-29 DIAGNOSIS — K922 Gastrointestinal hemorrhage, unspecified: Secondary | ICD-10-CM | POA: Diagnosis not present

## 2014-04-29 DIAGNOSIS — F39 Unspecified mood [affective] disorder: Secondary | ICD-10-CM | POA: Diagnosis not present

## 2014-04-29 DIAGNOSIS — E1142 Type 2 diabetes mellitus with diabetic polyneuropathy: Secondary | ICD-10-CM | POA: Diagnosis not present

## 2014-04-29 DIAGNOSIS — F0390 Unspecified dementia without behavioral disturbance: Secondary | ICD-10-CM | POA: Diagnosis not present

## 2014-04-29 LAB — HM DIABETES FOOT EXAM

## 2014-04-29 MED ORDER — FUROSEMIDE 20 MG PO TABS: ORAL_TABLET | ORAL | Status: DC

## 2014-04-29 NOTE — Telephone Encounter (Signed)
Order given to [place] as instructed by telephone.

## 2014-04-29 NOTE — Assessment & Plan Note (Signed)
Recent exacerbation and was in hospital Better but still not back to baseline

## 2014-04-29 NOTE — Telephone Encounter (Signed)
okay

## 2014-04-29 NOTE — Assessment & Plan Note (Signed)
Fair control on lyrica

## 2014-04-29 NOTE — Assessment & Plan Note (Signed)
Mood has been okay She feels most comfortable staying on the citalopram

## 2014-04-29 NOTE — Assessment & Plan Note (Signed)
No clear improvement on the donepezil Discussed and we will continue

## 2014-04-29 NOTE — Assessment & Plan Note (Addendum)
I have personally reviewed the Medicare Annual Wellness questionnaire and have noted 1. The patient's medical and social history 2. Their use of alcohol, tobacco or illicit drugs 3. Their current medications and supplements 4. The patient's functional ability including ADL's, fall risks, home safety risks and hearing or visual             impairment. 5. Diet and physical activities 6. Evidence for depression or mood disorders  The patients weight, height, BMI and visual acuity have been recorded in the chart I have made referrals, counseling and provided education to the patient based review of the above and I have provided the pt with a written personalized care plan for preventive services.  I have provided you with a copy of your personalized plan for preventive services. Please take the time to review along with your updated medication list.  Will give prevnar--then husband said she got it at Dr Wilson Singer. Will hold off for now and get records from him Just had colonoscopy Mammogram per surgeon Had flu shot  No pap due to age

## 2014-04-29 NOTE — Telephone Encounter (Signed)
[place] [facility] wanted to get a verbal order for Continuation of therapy.  Pt missed 1 day last week

## 2014-04-29 NOTE — Progress Notes (Signed)
Subjective:    Patient ID: [name], female    DOB: [birth year], 69 y.o.   MRN: [id number]  HPI Here for Medicare wellness and follow up of multiple medical problems Here with husband No alcohol or tobacco Did have 1 fall last summer--did hurt herself Not doing any exercise Independent with instrumental ADLS She and husband handle the finances together Sees Dr [name] for diabetes  Has improved from recent hospitalization Breathing is better but not back to baseline Cough is mostly gone No fever  Had GI bleed No source of blood found by EGD or colon Did have follow up with Dr [name] at [facility] Hgb up to 10.3  Ongoing memory problems No problems with the donepezil She is willing to continue in case it is helping  Checking sugars occasionally Usually a bit high but some under 120 No hypoglycemic reactions Seeing eye doctor tomorrow due to blurry vision  Mood has been good  Satisfied with the citalopram  Current Outpatient Prescriptions on File Prior to Visit  Medication Sig Dispense Refill  . albuterol (PROAIR HFA) 108 (90 BASE) MCG/ACT inhaler Inhale 2 puffs into the lungs every 6 (six) hours as needed for wheezing or shortness of breath. 18 g 0  . atorvastatin (LIPITOR) 40 MG tablet Take 1 tablet (40 mg total) by mouth daily. 90 tablet 3  . Cholecalciferol (VITAMIN D3) 5000 UNITS CAPS Take 1 capsule by mouth every morning.    . citalopram (CELEXA) 20 MG tablet Take 1 tablet (20 mg total) by mouth daily. 14 tablet 0  . donepezil (ARICEPT) 10 MG tablet Take 1 tablet (10 mg total) by mouth at bedtime. 90 tablet 3  . famotidine (PEPCID) 20 MG tablet Take 20 mg by mouth at bedtime as needed (for flare of cough).    . fexofenadine (ALLEGRA) 180 MG tablet Take 180 mg by mouth daily as needed (nasal drainage).    . fluticasone (FLONASE) 50 MCG/ACT nasal spray USE 2 SPRAYS NASALLY DAILY 48 g 1  . furosemide (LASIX) 20 MG tablet TAKE 1 TABLET BY MOUTH DAILY AS NEEDED FOR  EDEMA 30 tablet 0  . irbesartan (AVAPRO) 150 MG tablet Take 150 mg by mouth daily.    Marland Kitchen L-Methylfolate-Algae-B12-B6 (METANX) 3-90.314-2-35 MG CAPS Take 180 mg by mouth 2 (two) times daily.    Marland Kitchen LYRICA 75 MG capsule Take 75 mg by mouth 3 (three) times daily.     . metformin (FORTAMET) 1000 MG (OSM) 24 hr tablet Take 1 tablet (1,000 mg total) by mouth 2 (two) times daily. 180 tablet 3  . metoCLOPramide (REGLAN) 5 MG tablet Take 1 tablet (5 mg total) by mouth 4 (four) times daily -  before meals and at bedtime. 120 tablet 3  . mometasone-formoterol (DULERA) 200-5 MCG/ACT AERO Take 2 puffs first thing in am and then another 2 puffs about 12 hours later. 3 Inhaler 3  . montelukast (SINGULAIR) 10 MG tablet Take 1 tablet (10 mg total) by mouth daily. 30 tablet 11  . Multiple Vitamin (MULTIVITAMIN) tablet Take 1 tablet by mouth every morning.     . Omega-3 Fatty Acids (FISH OIL) 1200 MG CAPS Take 1 capsule by mouth every morning. (place in the freezer)    . omeprazole (PRILOSEC) 20 MG capsule Take 1 capsule (20 mg total) by mouth daily as needed. 90 capsule 3  . polyethylene glycol powder (GLYCOLAX/MIRALAX) powder Take 17 grams (1 capful) every morning and 9 grams (1/2 capful) every evening 850 g 10  .  SPIRIVA HANDIHALER 18 MCG inhalation capsule INHALE THE CONTENTS OF ONE CAPSULE VIA HANDIHALER     DAILY 90 capsule 3  . vitamin C (ASCORBIC ACID) 500 MG tablet Take 500 mg by mouth every morning.     No current facility-administered medications on file prior to visit.    Allergies  Allergen Reactions  . Budesonide-Formoterol Fumarate Other (See Comments)    REACTION: coughed worse  . Penicillins Rash    REACTION: rash    Past Medical History  Diagnosis Date  . Renal cell carcinoma   . Asthma   . Diabetes mellitus type II   . GERD (gastroesophageal reflux disease)   . Gastroparesis   . Diverticulosis   . Anxiety   . Depression   . History of breast cancer   . History of adenomatous polyp  of colon   . Hyperlipidemia   . COPD (chronic obstructive pulmonary disease)   . Pulmonary nodule     multiple  . Osteoarthritis   . Carcinoid tumor of lung 1991  . Mild cognitive impairment     Past Surgical History  Procedure Laterality Date  . Cholecystectomy  1975  . Tonsillectomy and adenoidectomy  1953  . Breast lumpectomy  1998    right breast  . Lung surgery  1994    carcinoid removal  . Laparoscopic nephrectomy  01/2009    right    Family History  Problem Relation Age of Onset  . Colon cancer Father   . Lung cancer Father   . Stroke Mother     History   Social History  . Marital Status: Married    Spouse Name: N/A  . Number of Children: 2  . Years of Education: N/A   Occupational History  . retired Science writer    Social History Main Topics  . Smoking status: Former Smoker -- 1.00 packs/day for 30 years    Types: Cigarettes    Quit date: 03/08/1993  . Smokeless tobacco: Never Used  . Alcohol Use: No  . Drug Use: No  . Sexual Activity: Not on file   Other Topics Concern  . Not on file   Social History Narrative   No living will   Requests husband as health care POA   Would accept attempts at resuscitation   No tube feeds if cognitively unaware            Review of Systems Bad nosebleed before hospitalization. Bled 1.5 hours before it was finally stopped. Did swallow some (1-2 weeks before hospitalization) Some headaches and dizziness--thinking it could be low blood pressure. Stopped the irbesartan Sleeps okay     Objective:   Physical Exam  Constitutional: She appears well-developed and well-nourished. No distress.  HENT:  Mouth/Throat: Oropharynx is clear and moist. No oropharyngeal exudate.  Neck: Normal range of motion. Neck supple. No thyromegaly present.  Cardiovascular: Normal rate, regular rhythm and intact distal pulses.  Exam reveals no gallop.   Soft systolic murmur  Pulmonary/Chest: Effort normal. No respiratory distress. She  has no wheezes. She has no rales.  Decreased breath sounds but clear  Abdominal: Soft. There is no tenderness.  Musculoskeletal:  1+ pitting edema in feet  Lymphadenopathy:    She has no cervical adenopathy.  Neurological:  decreased fine touch sensation in feet. No ulcers or lesions  Skin:  Healing skin tear on left calf (from cat) No foot lesions          Assessment & Plan:

## 2014-04-29 NOTE — Assessment & Plan Note (Signed)
Will recheck control Regularly sees Dr Wilson Singer but I have not gotten any results--will send labs to him though

## 2014-04-30 DIAGNOSIS — H16223 Keratoconjunctivitis sicca, not specified as Sjogren's, bilateral: Secondary | ICD-10-CM | POA: Diagnosis not present

## 2014-04-30 LAB — LDL CHOLESTEROL, DIRECT: Direct LDL: 77 mg/dL

## 2014-04-30 LAB — COMPREHENSIVE METABOLIC PANEL
ALT: 18 U/L (ref 0–35)
AST: 15 U/L (ref 0–37)
Albumin: 3.4 g/dL — ABNORMAL LOW (ref 3.5–5.2)
Alkaline Phosphatase: 47 U/L (ref 39–117)
BUN: 13 mg/dL (ref 6–23)
CO2: 29 meq/L (ref 19–32)
CREATININE: 1.13 mg/dL (ref 0.40–1.20)
Calcium: 9.4 mg/dL (ref 8.4–10.5)
Chloride: 102 mEq/L (ref 96–112)
GFR: 50.49 mL/min — AB (ref >60.00)
Glucose, Bld: 181 mg/dL — ABNORMAL HIGH (ref 70–99)
Potassium: 4.9 mEq/L (ref 3.5–5.1)
Sodium: 140 mEq/L (ref 135–145)
Total Bilirubin: 0.4 mg/dL (ref 0.2–1.2)
Total Protein: 5.8 g/dL — ABNORMAL LOW (ref 6.0–8.3)

## 2014-04-30 LAB — CBC WITH DIFFERENTIAL/PLATELET
Basophils Absolute: 0 10*3/uL (ref 0.0–0.1)
Basophils Relative: 0.4 % (ref 0.0–3.0)
EOS PCT: 1 % (ref 0.0–5.0)
Eosinophils Absolute: 0.1 10*3/uL (ref 0.0–0.7)
HEMATOCRIT: 31.8 % — AB (ref 36.0–46.0)
Hemoglobin: 10.4 g/dL — ABNORMAL LOW (ref 12.0–15.0)
LYMPHS ABS: 3 10*3/uL (ref 0.7–4.0)
Lymphocytes Relative: 29.2 % (ref 12.0–46.0)
MCHC: 32.6 g/dL (ref 30.0–36.0)
MCV: 88 fl (ref 78.0–100.0)
MONOS PCT: 5.9 % (ref 3.0–12.0)
Monocytes Absolute: 0.6 10*3/uL (ref 0.1–1.0)
Neutro Abs: 6.5 10*3/uL (ref 1.4–7.7)
Neutrophils Relative %: 63.5 % (ref 43.0–77.0)
Platelets: 273 10*3/uL (ref 150.0–400.0)
RBC: 3.62 Mil/uL — ABNORMAL LOW (ref 3.87–5.11)
RDW: 17.3 % — ABNORMAL HIGH (ref 11.5–15.5)
WBC: 10.3 10*3/uL (ref 4.0–10.5)

## 2014-04-30 LAB — LIPID PANEL
CHOLESTEROL: 150 mg/dL (ref 0–200)
HDL: 49.9 mg/dL (ref >39.00)
NONHDL: 100.1
Total CHOL/HDL Ratio: 3
Triglycerides: 236 mg/dL — ABNORMAL HIGH (ref 0.0–149.0)
VLDL: 47.2 mg/dL — ABNORMAL HIGH (ref 0.0–40.0)

## 2014-04-30 LAB — HEMOGLOBIN A1C: Hgb A1c MFr Bld: 8.4 % — ABNORMAL HIGH (ref 4.6–6.5)

## 2014-04-30 LAB — T4, FREE: Free T4: 1.18 ng/dL (ref 0.60–1.60)

## 2014-05-01 ENCOUNTER — Ambulatory Visit: Payer: Medicare Other | Admitting: Internal Medicine

## 2014-05-02 ENCOUNTER — Telehealth: Payer: Self-pay

## 2014-05-02 NOTE — Telephone Encounter (Signed)
Pt left v/m requesting free style test strips to [facility] [address] Not on pts med list and needs to know if Dr Wilson Singer prescribes or how often pt cks BS. Left v/m for pt to cb.

## 2014-05-03 DIAGNOSIS — F0390 Unspecified dementia without behavioral disturbance: Secondary | ICD-10-CM | POA: Diagnosis not present

## 2014-05-03 DIAGNOSIS — I10 Essential (primary) hypertension: Secondary | ICD-10-CM | POA: Diagnosis not present

## 2014-05-03 DIAGNOSIS — E1140 Type 2 diabetes mellitus with diabetic neuropathy, unspecified: Secondary | ICD-10-CM | POA: Diagnosis not present

## 2014-05-03 DIAGNOSIS — J441 Chronic obstructive pulmonary disease with (acute) exacerbation: Secondary | ICD-10-CM | POA: Diagnosis not present

## 2014-05-03 DIAGNOSIS — K922 Gastrointestinal hemorrhage, unspecified: Secondary | ICD-10-CM | POA: Diagnosis not present

## 2014-05-03 DIAGNOSIS — D649 Anemia, unspecified: Secondary | ICD-10-CM | POA: Diagnosis not present

## 2014-05-03 NOTE — Telephone Encounter (Signed)
Yes, it is possible that the 20mg  dose is not enough. Okay to try 2 of the 20mg  tabs (40mg ) when needed. If that works better, we can change the prescription to the 40mg  dose

## 2014-05-03 NOTE — Telephone Encounter (Signed)
Pt left v/m; pt was prescribed furosemide 20 mg taking one tab daily as needed for swelling. Feet and leg swelling is no better than when seen on 04/30/14; pt wants to know if should take more than one of the fluid pills daily when swollen.pt request cb.

## 2014-05-03 NOTE — Telephone Encounter (Signed)
Pt returned your call. Please call back at [phone]. Thank you.

## 2014-05-03 NOTE — Telephone Encounter (Signed)
Left message for patient to call back regarding lasix increase per Dr Silvio Pate.

## 2014-05-06 NOTE — Telephone Encounter (Signed)
Patient aware of lasix directions.  She will make an appointment if it does not help.

## 2014-05-07 ENCOUNTER — Other Ambulatory Visit: Payer: Self-pay

## 2014-05-07 DIAGNOSIS — J441 Chronic obstructive pulmonary disease with (acute) exacerbation: Secondary | ICD-10-CM | POA: Diagnosis not present

## 2014-05-07 DIAGNOSIS — I10 Essential (primary) hypertension: Secondary | ICD-10-CM | POA: Diagnosis not present

## 2014-05-07 DIAGNOSIS — D649 Anemia, unspecified: Secondary | ICD-10-CM | POA: Diagnosis not present

## 2014-05-07 DIAGNOSIS — E1140 Type 2 diabetes mellitus with diabetic neuropathy, unspecified: Secondary | ICD-10-CM | POA: Diagnosis not present

## 2014-05-07 DIAGNOSIS — K922 Gastrointestinal hemorrhage, unspecified: Secondary | ICD-10-CM | POA: Diagnosis not present

## 2014-05-07 DIAGNOSIS — F0390 Unspecified dementia without behavioral disturbance: Secondary | ICD-10-CM | POA: Diagnosis not present

## 2014-05-07 MED ORDER — GLUCOSE BLOOD VI STRP: ORAL_STRIP | Status: DC

## 2014-05-09 DIAGNOSIS — E119 Type 2 diabetes mellitus without complications: Secondary | ICD-10-CM | POA: Diagnosis not present

## 2014-05-09 DIAGNOSIS — R911 Solitary pulmonary nodule: Secondary | ICD-10-CM | POA: Diagnosis not present

## 2014-05-09 DIAGNOSIS — E789 Disorder of lipoprotein metabolism, unspecified: Secondary | ICD-10-CM | POA: Diagnosis not present

## 2014-05-09 LAB — CREATININE, SERUM: Creat: 1.2 mg/dL — ABNORMAL HIGH (ref 0.50–1.10)

## 2014-05-09 LAB — BUN: BUN: 16 mg/dL (ref 6–23)

## 2014-05-10 ENCOUNTER — Other Ambulatory Visit: Payer: Self-pay | Admitting: Internal Medicine

## 2014-05-10 DIAGNOSIS — F0390 Unspecified dementia without behavioral disturbance: Secondary | ICD-10-CM | POA: Diagnosis not present

## 2014-05-10 DIAGNOSIS — E1140 Type 2 diabetes mellitus with diabetic neuropathy, unspecified: Secondary | ICD-10-CM | POA: Diagnosis not present

## 2014-05-10 DIAGNOSIS — K922 Gastrointestinal hemorrhage, unspecified: Secondary | ICD-10-CM | POA: Diagnosis not present

## 2014-05-10 DIAGNOSIS — I10 Essential (primary) hypertension: Secondary | ICD-10-CM | POA: Diagnosis not present

## 2014-05-10 DIAGNOSIS — D649 Anemia, unspecified: Secondary | ICD-10-CM | POA: Diagnosis not present

## 2014-05-10 DIAGNOSIS — J441 Chronic obstructive pulmonary disease with (acute) exacerbation: Secondary | ICD-10-CM | POA: Diagnosis not present

## 2014-05-13 ENCOUNTER — Telehealth: Payer: Self-pay | Admitting: Internal Medicine

## 2014-05-13 DIAGNOSIS — F0390 Unspecified dementia without behavioral disturbance: Secondary | ICD-10-CM | POA: Diagnosis not present

## 2014-05-13 DIAGNOSIS — I10 Essential (primary) hypertension: Secondary | ICD-10-CM | POA: Diagnosis not present

## 2014-05-13 DIAGNOSIS — K922 Gastrointestinal hemorrhage, unspecified: Secondary | ICD-10-CM | POA: Diagnosis not present

## 2014-05-13 DIAGNOSIS — J441 Chronic obstructive pulmonary disease with (acute) exacerbation: Secondary | ICD-10-CM | POA: Diagnosis not present

## 2014-05-13 DIAGNOSIS — D649 Anemia, unspecified: Secondary | ICD-10-CM | POA: Diagnosis not present

## 2014-05-13 DIAGNOSIS — R609 Edema, unspecified: Secondary | ICD-10-CM

## 2014-05-13 DIAGNOSIS — E1140 Type 2 diabetes mellitus with diabetic neuropathy, unspecified: Secondary | ICD-10-CM | POA: Diagnosis not present

## 2014-05-13 NOTE — Telephone Encounter (Signed)
Amy @ [facility] called she saw [name] last Thursday and today.  She is doing great.  She has a little edema in lower legs and ankles.  Amy stated she thought Tracy Estill would benefit from compression hose    Please advise amy

## 2014-05-13 NOTE — Telephone Encounter (Signed)
Per Morey Hummingbird, Amy contacted office back and will have pt call

## 2014-05-13 NOTE — Addendum Note (Signed)
Addended by: Jearld Fenton on: 05/13/2014 11:40 AM   Modules accepted: Orders

## 2014-05-13 NOTE — Telephone Encounter (Signed)
RX printed, placed in MYD box

## 2014-05-13 NOTE — Telephone Encounter (Signed)
Lm on [name]'s vm requesting a call back. Should order be faxed or is someone to pick it up from office?

## 2014-05-14 DIAGNOSIS — E1140 Type 2 diabetes mellitus with diabetic neuropathy, unspecified: Secondary | ICD-10-CM | POA: Diagnosis not present

## 2014-05-14 DIAGNOSIS — I10 Essential (primary) hypertension: Secondary | ICD-10-CM | POA: Diagnosis not present

## 2014-05-14 DIAGNOSIS — F0390 Unspecified dementia without behavioral disturbance: Secondary | ICD-10-CM | POA: Diagnosis not present

## 2014-05-14 DIAGNOSIS — J441 Chronic obstructive pulmonary disease with (acute) exacerbation: Secondary | ICD-10-CM | POA: Diagnosis not present

## 2014-05-14 DIAGNOSIS — D649 Anemia, unspecified: Secondary | ICD-10-CM | POA: Diagnosis not present

## 2014-05-14 DIAGNOSIS — K922 Gastrointestinal hemorrhage, unspecified: Secondary | ICD-10-CM | POA: Diagnosis not present

## 2014-05-15 ENCOUNTER — Ambulatory Visit
Admission: RE | Admit: 2014-05-15 | Discharge: 2014-05-15 | Disposition: A | Discharge status: Home / Self Care | Payer: Medicare Other | Source: Ambulatory Visit | Attending: Cardiothoracic Surgery | Admitting: Cardiothoracic Surgery

## 2014-05-15 ENCOUNTER — Ambulatory Visit (INDEPENDENT_AMBULATORY_CARE_PROVIDER_SITE_OTHER): Payer: Medicare Other | Admitting: Cardiothoracic Surgery

## 2014-05-15 ENCOUNTER — Encounter: Payer: Self-pay | Admitting: Cardiothoracic Surgery

## 2014-05-15 VITALS — BP 118/69 | HR 76 | Resp 20 | Ht 62.0 in | Wt 139.0 lb

## 2014-05-15 DIAGNOSIS — Z8709 Personal history of other diseases of the respiratory system: Secondary | ICD-10-CM

## 2014-05-15 DIAGNOSIS — Z853 Personal history of malignant neoplasm of breast: Secondary | ICD-10-CM

## 2014-05-15 DIAGNOSIS — Z87898 Personal history of other specified conditions: Secondary | ICD-10-CM

## 2014-05-15 DIAGNOSIS — Z85528 Personal history of other malignant neoplasm of kidney: Secondary | ICD-10-CM | POA: Diagnosis not present

## 2014-05-15 DIAGNOSIS — J984 Other disorders of lung: Secondary | ICD-10-CM | POA: Diagnosis not present

## 2014-05-15 DIAGNOSIS — R911 Solitary pulmonary nodule: Secondary | ICD-10-CM | POA: Diagnosis not present

## 2014-05-15 MED ORDER — IOPAMIDOL (ISOVUE-300) INJECTION 61%
75.0000 mL | Freq: Once | INTRAVENOUS | Status: AC | PRN
  Administered 2014-05-15: 75 mL via INTRAVENOUS

## 2014-05-16 DIAGNOSIS — D649 Anemia, unspecified: Secondary | ICD-10-CM | POA: Diagnosis not present

## 2014-05-16 DIAGNOSIS — G629 Polyneuropathy, unspecified: Secondary | ICD-10-CM | POA: Diagnosis not present

## 2014-05-16 DIAGNOSIS — E118 Type 2 diabetes mellitus with unspecified complications: Secondary | ICD-10-CM | POA: Diagnosis not present

## 2014-05-16 NOTE — Progress Notes (Signed)
PCP is Viviana Simpler, MD Referring Provider is Franchot Gallo, MD  Chief Complaint  Patient presents with  . Follow-up    5 month f/u with Chest CT  . Lung Lesion    HPI:the patient returns for 6 month followup with CT scan to review a 7 mm right lower lobe pulmonary nodule. The patient had been followed by Dr. Arlyce Dice for several years with small bilateral poor nodules which have been stable and felt to be due to possible MAC. The patient had a previous right thoracotomy and resection of a carcinoid tumor 1994 at [facility]. The patient had a nephrectomy for renal cell carcinoma in 2010. On the last surveillance CT scans a nodule in the superior segment right lower lobe showed an increase from 4 mm to 7 mm in repeat scan in 6 months is recommended. The patient denies any use pulmonary symptoms in the interim. Patient has  COPD with dyspnea on exertion requiring inhaler therapy.the patient denies being admitted to the hospital for pneumonia over the winter. She denies hemoptysis.she has been a nonsmoker for several years.  CT scan shows the nodule to be unchanged over the past 6 months. The nodule is too small for biopsy or for evaluation by PET scan. There are no other new at risk changes on the CT scan.   Past Medical History  Diagnosis Date  . Renal cell carcinoma   . Asthma   . Diabetes mellitus type II   . GERD (gastroesophageal reflux disease)   . Gastroparesis   . Diverticulosis   . Anxiety   . Depression   . History of breast cancer   . History of adenomatous polyp of colon   . Hyperlipidemia   . COPD (chronic obstructive pulmonary disease)   . Pulmonary nodule     multiple  . Osteoarthritis   . Carcinoid tumor of lung 1991  . Mild cognitive impairment     Past Surgical History  Procedure Laterality Date  . Cholecystectomy  1975  . Tonsillectomy and adenoidectomy  1953  . Breast lumpectomy  1998    right breast  . Lung surgery  1994    carcinoid removal  .  Laparoscopic nephrectomy  01/2009    right    Family History  Problem Relation Age of Onset  . Colon cancer Father   . Lung cancer Father   . Stroke Mother     Social History History  Substance Use Topics  . Smoking status: Former Smoker -- 1.00 packs/day for 30 years    Types: Cigarettes    Quit date: 03/08/1993  . Smokeless tobacco: Never Used  . Alcohol Use: No    Current Outpatient Prescriptions  Medication Sig Dispense Refill  . albuterol (PROAIR HFA) 108 (90 BASE) MCG/ACT inhaler Inhale 2 puffs into the lungs every 6 (six) hours as needed for wheezing or shortness of breath. 18 g 0  . atorvastatin (LIPITOR) 40 MG tablet Take 1 tablet (40 mg total) by mouth daily. 90 tablet 3  . Cholecalciferol (VITAMIN D3) 5000 UNITS CAPS Take 1 capsule by mouth every morning.    . citalopram (CELEXA) 20 MG tablet Take 1 tablet (20 mg total) by mouth daily. 14 tablet 0  . donepezil (ARICEPT) 10 MG tablet Take 1 tablet (10 mg total) by mouth at bedtime. 90 tablet 3  . ferrous sulfate 325 (65 FE) MG tablet Take by mouth.    . fexofenadine (ALLEGRA) 180 MG tablet Take 180 mg by mouth daily as  needed (nasal drainage).    . fluticasone (FLONASE) 50 MCG/ACT nasal spray USE 2 SPRAYS NASALLY DAILY 48 g 11  . furosemide (LASIX) 20 MG tablet TAKE 1 TABLET BY MOUTH DAILY AS NEEDED FOR EDEMA 30 tablet 2  . glucose blood (FREESTYLE TEST STRIPS) test strip Use as instructed 100 each 12  . L-Methylfolate-Algae-B12-B6 (METANX) 3-90.314-2-35 MG CAPS Take 180 mg by mouth 2 (two) times daily.    Marland Kitchen LYRICA 75 MG capsule Take 75 mg by mouth 3 (three) times daily.     . metformin (FORTAMET) 1000 MG (OSM) 24 hr tablet Take 1 tablet (1,000 mg total) by mouth 2 (two) times daily. 180 tablet 3  . metoCLOPramide (REGLAN) 5 MG tablet Take 1 tablet (5 mg total) by mouth 4 (four) times daily -  before meals and at bedtime. (Patient taking differently: Take 5 mg by mouth 4 (four) times daily as needed. ) 120 tablet 3  .  mometasone-formoterol (DULERA) 200-5 MCG/ACT AERO Take 2 puffs first thing in am and then another 2 puffs about 12 hours later. 3 Inhaler 3  . montelukast (SINGULAIR) 10 MG tablet Take 1 tablet (10 mg total) by mouth daily. 30 tablet 11  . Multiple Vitamin (MULTIVITAMIN) tablet Take 1 tablet by mouth every morning.     . Omega-3 Fatty Acids (FISH OIL) 1200 MG CAPS Take 1 capsule by mouth every morning. (place in the freezer)    . pantoprazole (PROTONIX) 40 MG tablet Take by mouth.    . polyethylene glycol powder (GLYCOLAX/MIRALAX) powder Take 17 grams (1 capful) every morning and 9 grams (1/2 capful) every evening 850 g 10  . SPIRIVA HANDIHALER 18 MCG inhalation capsule INHALE THE CONTENTS OF ONE CAPSULE VIA HANDIHALER     DAILY 90 capsule 3  . vitamin C (ASCORBIC ACID) 500 MG tablet Take 500 mg by mouth every morning.     No current facility-administered medications for this visit.    Allergies  Allergen Reactions  . Budesonide-Formoterol Fumarate Other (See Comments)    REACTION: coughed worse  . Penicillins Rash    REACTION: rash    Review of Systems   Gen.-no fever weight loss or night sweats HEENT no change in vision no difficulty swallowing no dental complaints Thoracic-dyspnea on exertion, COPD requiring inhalers therapy Cardiac-no angina symptoms of heart failure or arrhythmia GI-no abdominal pain change in bowel habits Urologic-creatinine stable after nephrectomy, no hematuria Neurologic-no symptoms of TIA claudication Extremities-positive edema, positive erythema of the right pretibial skin, positive traumatic ulceration of left pretibial skin from a cat scratch--Fell out of her lap and scratched her leg on the way down to the floor. Being treated with soap and water   BP 118/69 mmHg  Pulse 76  Resp 20  Ht 5\' 2"  (1.575 m)  Wt 139 lb (63.05 kg)  BMI 25.42 kg/m2  SpO2 92% Physical Exam  General: Elderly female, frail, mannerisms consistent with dementia HEENT:  Normocephalic pupils equal , dentition adequate Neck: Supple without JVD, adenopathy, or bruit Chest: Clear to auscultation, symmetrical breath sounds, + scattered rhonchi, no tenderness             or deformity Cardiovascular: Regular rate and rhythm, no murmur, no gallop, peripheral pulses             palpable in all extremities Abdomen:  Soft, nontender, no palpable mass or organomegaly Extremities: Warm, well-perfused,edema with erythema right pretibial area without skin breakdown. Left pretibial skin with ulcerated lesion with eschar from  cat scratch and superficial infection. No surrounding cellulitis. The ulcerated area was debrided clean with peroxide saline and silver nitrate topical application and a Neosporin dressing applied             Rectal/GU: Deferred Neuro: Grossly non--focal and symmetrical throughout Skin: Clean and dry without rash or ulceration   Diagnostic Tests: CT scan of chest reviewed with patient and her husband. The concerning 7 mm nodule in superior segment right lower lobe which had shown increase in size previously has been stable for the past 6 months.  Impression: Stable right lower lobe 7 mm nodule over the past 6 months. I would not recommend biopsy orPET scan at this point. Patient has significant dementia and multiple medical problems. We'll continue conservative management with serial surveillance scans.  Plan:return with CT scan of the chest in one year.   Len Childs, MD [facility] [phone]

## 2014-05-17 DIAGNOSIS — E1140 Type 2 diabetes mellitus with diabetic neuropathy, unspecified: Secondary | ICD-10-CM | POA: Diagnosis not present

## 2014-05-17 DIAGNOSIS — F0390 Unspecified dementia without behavioral disturbance: Secondary | ICD-10-CM | POA: Diagnosis not present

## 2014-05-17 DIAGNOSIS — I10 Essential (primary) hypertension: Secondary | ICD-10-CM | POA: Diagnosis not present

## 2014-05-17 DIAGNOSIS — K922 Gastrointestinal hemorrhage, unspecified: Secondary | ICD-10-CM | POA: Diagnosis not present

## 2014-05-17 DIAGNOSIS — D649 Anemia, unspecified: Secondary | ICD-10-CM | POA: Diagnosis not present

## 2014-05-17 DIAGNOSIS — J441 Chronic obstructive pulmonary disease with (acute) exacerbation: Secondary | ICD-10-CM | POA: Diagnosis not present

## 2014-05-17 NOTE — Telephone Encounter (Signed)
Spoke to pt and she states she does not want the hose at this time as her swelling has gone down

## 2014-05-22 DIAGNOSIS — D649 Anemia, unspecified: Secondary | ICD-10-CM | POA: Diagnosis not present

## 2014-05-28 ENCOUNTER — Encounter: Payer: Self-pay | Admitting: Internal Medicine

## 2014-05-28 DIAGNOSIS — H16223 Keratoconjunctivitis sicca, not specified as Sjogren's, bilateral: Secondary | ICD-10-CM | POA: Diagnosis not present

## 2014-05-28 DIAGNOSIS — R413 Other amnesia: Secondary | ICD-10-CM

## 2014-05-28 DIAGNOSIS — E119 Type 2 diabetes mellitus without complications: Secondary | ICD-10-CM | POA: Diagnosis not present

## 2014-05-29 NOTE — Telephone Encounter (Signed)
Pt would like [facility]  (I can not find a referral that fits)

## 2014-06-03 DIAGNOSIS — J441 Chronic obstructive pulmonary disease with (acute) exacerbation: Secondary | ICD-10-CM | POA: Diagnosis not present

## 2014-06-03 DIAGNOSIS — D649 Anemia, unspecified: Secondary | ICD-10-CM | POA: Diagnosis not present

## 2014-06-03 DIAGNOSIS — E1140 Type 2 diabetes mellitus with diabetic neuropathy, unspecified: Secondary | ICD-10-CM | POA: Diagnosis not present

## 2014-06-03 DIAGNOSIS — I10 Essential (primary) hypertension: Secondary | ICD-10-CM | POA: Diagnosis not present

## 2014-06-03 DIAGNOSIS — K922 Gastrointestinal hemorrhage, unspecified: Secondary | ICD-10-CM | POA: Diagnosis not present

## 2014-06-03 DIAGNOSIS — F0390 Unspecified dementia without behavioral disturbance: Secondary | ICD-10-CM | POA: Diagnosis not present

## 2014-06-17 DIAGNOSIS — M791 Myalgia: Secondary | ICD-10-CM | POA: Diagnosis not present

## 2014-06-17 DIAGNOSIS — M9903 Segmental and somatic dysfunction of lumbar region: Secondary | ICD-10-CM | POA: Diagnosis not present

## 2014-06-17 DIAGNOSIS — M5442 Lumbago with sciatica, left side: Secondary | ICD-10-CM | POA: Diagnosis not present

## 2014-06-17 DIAGNOSIS — M5136 Other intervertebral disc degeneration, lumbar region: Secondary | ICD-10-CM | POA: Diagnosis not present

## 2014-06-17 DIAGNOSIS — M9905 Segmental and somatic dysfunction of pelvic region: Secondary | ICD-10-CM | POA: Diagnosis not present

## 2014-06-17 DIAGNOSIS — M9904 Segmental and somatic dysfunction of sacral region: Secondary | ICD-10-CM | POA: Diagnosis not present

## 2014-06-18 DIAGNOSIS — M791 Myalgia: Secondary | ICD-10-CM | POA: Diagnosis not present

## 2014-06-18 DIAGNOSIS — M9903 Segmental and somatic dysfunction of lumbar region: Secondary | ICD-10-CM | POA: Diagnosis not present

## 2014-06-18 DIAGNOSIS — M5136 Other intervertebral disc degeneration, lumbar region: Secondary | ICD-10-CM | POA: Diagnosis not present

## 2014-06-18 DIAGNOSIS — M9905 Segmental and somatic dysfunction of pelvic region: Secondary | ICD-10-CM | POA: Diagnosis not present

## 2014-06-18 DIAGNOSIS — M9904 Segmental and somatic dysfunction of sacral region: Secondary | ICD-10-CM | POA: Diagnosis not present

## 2014-06-18 DIAGNOSIS — M5442 Lumbago with sciatica, left side: Secondary | ICD-10-CM | POA: Diagnosis not present

## 2014-06-20 DIAGNOSIS — M9903 Segmental and somatic dysfunction of lumbar region: Secondary | ICD-10-CM | POA: Diagnosis not present

## 2014-06-20 DIAGNOSIS — M9905 Segmental and somatic dysfunction of pelvic region: Secondary | ICD-10-CM | POA: Diagnosis not present

## 2014-06-20 DIAGNOSIS — M5442 Lumbago with sciatica, left side: Secondary | ICD-10-CM | POA: Diagnosis not present

## 2014-06-20 DIAGNOSIS — M5136 Other intervertebral disc degeneration, lumbar region: Secondary | ICD-10-CM | POA: Diagnosis not present

## 2014-06-20 DIAGNOSIS — M791 Myalgia: Secondary | ICD-10-CM | POA: Diagnosis not present

## 2014-06-20 DIAGNOSIS — M9904 Segmental and somatic dysfunction of sacral region: Secondary | ICD-10-CM | POA: Diagnosis not present

## 2014-06-29 NOTE — Op Note (Signed)
PATIENT NAME:  [name], [name] MR#:  [id number] DATE OF BIRTH:  May 29, 1945  DATE OF PROCEDURE:  10/29/2013  PREOPERATIVE DIAGNOSIS: Cataract, left eye.   POSTOPERATIVE DIAGNOSIS: Cataract, left eye.   PROCEDURE PERFORMED: Extracapsular cataract extraction using phacoemulsification and placement of Alcon [id number], 24.0 diopter posterior chamber lens, serial number [id number].   SURGEON: Loura Back. Shakeel Disney, MD   ANESTHESIA: Lidocaine 4% and 0.75% Marcaine in a 50-50 mixture with 10 units per mL of Hylenex added, given as a peribulbar.  ANESTHESIOLOGIST: Gjibertus F. Boston Service, MD  COMPLICATIONS: None.   ESTIMATED BLOOD LOSS: Less than 1 mL.   DESCRIPTION OF PROCEDURE:  The patient was brought to the operating room and given a peribulbar block.  The patient was then prepped and draped in the usual fashion.  The vertical rectus muscles were imbricated using 5-0 silk sutures.  These sutures were then clamped to the sterile drapes as bridle sutures.  A limbal peritomy was performed extending two clock hours and hemostasis was obtained with cautery.  A partial thickness scleral groove was made at the surgical limbus and dissected anteriorly in a lamellar dissection using an Alcon crescent knife.  The anterior chamber was entered supero-temporally with a Superblade and through the lamellar dissection with a 2.6 mm keratome.  DisCoVisc was used to replace the aqueous and a continuous tear capsulorrhexis was carried out.  Hydrodissection and hydrodelineation were carried out with balanced salt and a 27 gauge canula.  The nucleus was rotated to confirm the effectiveness of the hydrodissection.  Phacoemulsification was carried out using a divide-and-conquer technique.  Total ultrasound time was 1 minute and 18 seconds with an average power of 24.2 percent. CDE of 31.84. No suture was placed.   Irrigation/aspiration was used to remove the residual cortex.  DisCoVisc was used to inflate the capsule  and the internal incision was enlarged to 3 mm with the crescent knife.  The intraocular lens was folded and inserted into the capsular bag using the [employer].  Irrigation/aspiration was used to remove the residual DisCoVisc.  Miostat was injected into the anterior chamber through the paracentesis track to inflate the anterior chamber and induce miosis. Vigamox 0.1 mL containing 0.1 mg of drug was injected via the paracentesis track. The wound was checked for leaks and none were found. The conjunctiva was closed with cautery and the bridle sutures were removed.  Two drops of 0.3% Vigamox were placed on the eye.   An eye shield was placed on the eye.  The patient was discharged to the recovery room in good condition.    ____________________________ Loura Back Aedan Geimer, MD sad:LT D: 10/29/2013 13:42:11 ET T: 10/29/2013 23:55:36 ET JOB#: [id number]  cc: Remo Lipps A. Lorris Carducci, MD, <Dictator> Martie Lee MD ELECTRONICALLY SIGNED 11/05/2013 13:36

## 2014-07-01 DIAGNOSIS — D509 Iron deficiency anemia, unspecified: Secondary | ICD-10-CM | POA: Diagnosis not present

## 2014-07-01 LAB — SURGICAL PATHOLOGY

## 2014-07-02 DIAGNOSIS — Z6823 Body mass index (BMI) 23.0-23.9, adult: Secondary | ICD-10-CM | POA: Diagnosis not present

## 2014-07-02 DIAGNOSIS — G3184 Mild cognitive impairment, so stated: Secondary | ICD-10-CM | POA: Diagnosis not present

## 2014-07-02 DIAGNOSIS — R2681 Unsteadiness on feet: Secondary | ICD-10-CM | POA: Diagnosis not present

## 2014-07-04 ENCOUNTER — Other Ambulatory Visit: Payer: Self-pay | Admitting: Internal Medicine

## 2014-07-05 DIAGNOSIS — M6281 Muscle weakness (generalized): Secondary | ICD-10-CM | POA: Diagnosis not present

## 2014-07-05 DIAGNOSIS — R269 Unspecified abnormalities of gait and mobility: Secondary | ICD-10-CM | POA: Diagnosis not present

## 2014-07-05 DIAGNOSIS — J449 Chronic obstructive pulmonary disease, unspecified: Secondary | ICD-10-CM | POA: Diagnosis not present

## 2014-07-07 NOTE — H&P (Signed)
PATIENT NAME:  [name], WINSLETT MR#:  [id number] DATE OF BIRTH:  [birth year]  DATE OF ADMISSION:  04/13/2014  REFERRING PHYSICIAN: Ahmed Prima, MD  FAMILY PHYSICIAN: Venia Carbon, MD  REASON FOR ADMISSION: Acute respiratory distress.   HISTORY OF PRESENT ILLNESS: The patient is an 69 year old female with a history of COPD, diabetes, asthma, and previous lung cancer. Presents to the Emergency Room with a 4-5 day history of worsening shortness of breath, where she was found to be initially hypoxic and hypotensive. In the Emergency Room, the patient was given oxygen with DuoNeb SVN and IV steroids with mild improvement of her symptoms. She was also noted to be anemic with guaiac-positive stools. Denies any previous gastrointestinal history. She is now admitted for further evaluation.   PAST MEDICAL HISTORY:  1. Chronic obstructive pulmonary disease/asthma.  2. Type 2 diabetes.  3. Lung cancer, status post surgical excision.  4. History of breast cancer.  5. History of diverticulitis.  6. Peripheral neuropathy.  7. Status post cholecystectomy.   MEDICATIONS:  1. Vitamin D3 5000 units p.o. daily.  2. Spiriva 1 capsule inhaled daily.  3. Albuterol 2 puffs every 6 hours p.r.n.  4. MiraLax 17 grams p.o. daily.  5. Omeprazole 20 mg p.o. daily as needed.  6. Multivitamin 1 p.o. daily.  7. Singulair 10 mg p.o. daily.  8. Reglan 5 mg p.o. before meals and at bedtime.  9. Metformin 1000 mg p.o. b.i.d.  10. Lyrica 75 mg p.o. t.i.d.  11. Irbesartan 150 mg p.o. daily.  12. Lasix 20 mg p.o. daily as needed.  13. Dulera 5/200, 2 puffs b.i.d.  14. Flonase 2 puffs in each nostril daily.  15. Allegra 180 mg p.o. daily.  16. Pepcid 20 mg p.o. at bedtime.  17. Aricept 10 mg p.o. daily.  18. Celexa 20 mg p.o. daily.  19. Lipitor 40 mg p.o. at bedtime.   ALLERGIES: PENICILLIN AND TAPE.   SOCIAL HISTORY: The patient quit smoking over 10 years ago. Denies alcohol abuse. She is married.    FAMILY HISTORY: Positive for hypertension, coronary artery disease, stroke and diabetes. Negative for colon cancer. Negative for breast cancer, other than the patient's own history.   REVIEW OF SYSTEMS:  CONSTITUTIONAL: No fever or change in weight.  EYES: No blurred or double vision. No glaucoma.  EARS, NOSE AND THROAT: No tinnitus or hearing loss. No nasal discharge or bleeding. No difficulty swallowing.  RESPIRATORY: The patient has had cough and wheezing, but denies hemoptysis. No painful respiration.  CARDIOVASCULAR: No chest pain or orthopnea. No palpitations.  GASTROINTESTINAL: No nausea, vomiting, or diarrhea. No abdominal pain. No change in bowel habits.  GENITOURINARY: No dysuria or hematuria. No incontinence.  ENDOCRINE: No polyuria or polydipsia. No heat or cold intolerance.  HEMATOLOGIC: The patient denies anemia, easy bruising or bleeding.  LYMPHATIC: No swollen glands.  MUSCULOSKELETAL: The patient has pain in her neck, back, shoulders, knees, hips. No gout.  NEUROLOGIC: No numbness or migraines. Denies stroke or seizures.  PSYCHIATRIC: The patient denies anxiety, insomnia, depression.   PHYSICAL EXAMINATION:  GENERAL: The patient is in no acute distress.  VITAL SIGNS: Currently remarkable for a blood pressure of 113/58, heart rate of 80, respiratory rate of 26, temperature of 98.5 and a sat of 100% on 2 liters.  HEENT: Normocephalic, atraumatic. Pupils are equal, round, and reactive to light and accommodation. Extraocular movements are intact. Sclerae are anicteric. Conjunctivae are clear.  OROPHARYNX: Clear.  NECK: Supple without JVD. No  adenopathy or thyromegaly is noted.  LUNGS: Revealed scattered wheezes and rhonchi, without rales. No dullness. Respiratory effort is mildly increased.  CARDIAC: Regular rate and rhythm. Normal S1, S2. No significant rubs, murmurs or gallops. PMI is nondisplaced. Chest wall is nontender.  ABDOMEN: Soft, nontender, with normoactive bowel  sounds. No organomegaly or masses were appreciated. No hernias or bruits were noted. Stool was guaiac per the Emergency Room physician.  EXTREMITIES: Without clubbing, cyanosis, edema. Pulses were 2+ bilaterally.  SKIN: Warm, dry without rash or lesions.  NEUROLOGIC: Cranial nerves II through XII grossly intact. Deep tendon reflexes were symmetric. Motor and sensory exam is nonfocal.  PSYCHIATRIC: Revealed a patient who is alert and oriented to person, place, and time. She was cooperative and used good judgment.   LABORATORY DATA: EKG revealed sinus rhythm with no acute ischemic changes. Chest x-ray was negative for infiltrate or edema. Her white count was 7.2 with a hemoglobin of 8.3. Her MCV was 90. Troponin was less than 0.02. Glucose 314 with a BUN of 15, creatinine 1.32, GFR 42.   ASSESSMENT:  1. Acute respiratory distress.  2. Chronic obstructive pulmonary disease exacerbation.  3. Presumed bronchitis.  4. Anemia.  5. Guaiac-positive stools.  6. Type 2 diabetes.  7. A remote history of lung cancer.   PLAN: The patient will be admitted to the floor with oxygen, intravenous steroids, intravenous antibiotics and DuoNeb small volume nebulizers. We will follow her sugars closely, especially while on steroids. We will guaiac all stools and follow her hemoglobin daily. Will consult gastroenterology because of her guaiac-positive stools and anemia. Wean oxygen as tolerated. Followup labs in the morning. Further treatment and evaluation will depend upon the patient's progress.   TOTAL TIME SPENT ON THIS PATIENT: 50 minutes.    ____________________________ Leonie Douglas Doy Hutching, MD jds:ap D: 04/13/2014 13:51:15 ET T: 04/13/2014 14:14:46 ET JOB#: [id number]  cc: Leonie Douglas. Doy Hutching, MD, <Dictator> Venia Carbon, MD Breeanna Galgano Lennice Sites MD ELECTRONICALLY SIGNED 04/14/2014 13:34

## 2014-07-07 NOTE — Consult Note (Signed)
Details:   - GI Note:  EGD - > mild gastritis only Colon -> multiple flat colon polyps s/p polypectomy. One requring clips and tatoo.  Diverticulosis.  No source for anemia seen.   Recs:  - start ferrous sulfate 1 tab TID - recheck Hgb, iron studies in 6 weeks. If not improved, perform capsule endoscopy.   Electronic Signatures: Arther Dames (MD)  (Signed 08-Feb-16 15:46)  Authored: Details   Last Updated: 08-Feb-16 15:46 by Arther Dames (MD)

## 2014-07-07 NOTE — Discharge Summary (Signed)
PATIENT NAME:  [name], [name] MR#:  [id number] DATE OF BIRTH:  07-06-1945  DATE OF ADMISSION:  04/13/2014 DATE OF DISCHARGE:  04/16/2014  DISCHARGE DIAGNOSES: 1.  Acute chronic obstructive pulmonary disease exacerbation due to bronchitis.  2.  Acute gastrointestinal bleed, upper and lower GI scope done, follow in gastroenterology clinic.  3.  Anemia due to gastrointestinal bleed.  4.  Hypertension.  5.  Diabetes.  6.  Diabetic neuropathy.  DISCHARGE MEDICATIONS: 1.  Citalopram 20 mg oral tablet once a day.  2.  Donepezil 10 mg oral tablet once a day.  3.  Famotidine 20 mg once a day.  4.  Fexofenadine 1 tablet oral 180 mg once a day as needed for nasal drainage.  5.  Fish oil 1 capsule once a day.  6.  Fluticasone nasal spray 2 sprays each nostril once a day.  7.  Furosemide 20 mg oral once a day.  8.  Irbesartan 150 mg oral tablet once a day.  9.  Lyrica 75 mg oral capsule 3 times a day.  10.  Vitamin plus B complex capsule 2 times a day.  11.  Metformin 1000 mg oral tablet extended-release 2 times a day.  12.  Metoclopramide 5 mg oral tablet 4 times a day before meal and at bedtime.  13.  Montelukast 10 mg oral tablet once a day.  14.  Multivitamin once a day.  15.  Omeprazole 20 mg delayed-release capsule once a day as needed.  16.  Polyethylene glycol 17 grams for constipation as needed.  17.  Pro Air 90 mcg inhalation 2 puffs every 6 hours as needed.  18.  Spiriva 18 mcg inhalation capsule once a day.  19.  Vitamin D3 5000 international units once a day.  20.  Atorvastatin 40 mg oral tablet once a day.  21.  Prednisone 10 mg start at 60 mg and taper x 10 mg daily until complete.  22.  Albuterol 3 mL inhalation every 6 hours as needed for shortness of breath.  23.  Ferrous sulfate 325 mg 3 times a day.  24.  Azithromycin 500 mg oral tablet every 24 hours for 2 days. 25.  Pantoprazole 40 mg oral delayed-release tablet 2 times a day.   DISCHARGE INSTRUCTIONS: Advised to  have home health physical therapy and nurse aide at home.  DISCHARGE DIET: Low-sodium, carbohydrate-controlled diet.   HISTORY OF PRESENT ILLNESS: A 69 year old female with history of COPD and previous lung cancer who came to the Emergency Room for 4 to 5 day history of worsening shortness of breath and found to be hypoxic and hypotensive, given oxygen, nebulizer and steroids. Had improved symptoms slightly. Also noted to have guaiac-positive stool and so admitted for further evaluation.  HOSPITAL COURSE AND STAY:  1.  For COPD exacerbation, likely it was due to bronchitis. Chest x-ray was clear. She was given IV steroid taper to oral, continue nebulizer, Spiriva and Singulair. Her oxygen level was dropping, even after her symptoms improved, to 87% on exertion, so we arranged for oxygen at home.  2.  Anemia with heme positive stool. There was no evidence of active bleeding. Received transfusion as her hemoglobin was lower than her baseline and the patient does complain of weakness. Gastroenterology did upper and lower GI scope and found some gastritis and polyps, suggested to give her PPI b.i.d. and follow in the clinic after a month.  3.  Diabetes. Continued sliding scale coverage initially and then restarted her metformin.  4.  Diabetic neuropathy. Continued Lyrica.  5.  Dementia. Continued Aricept. 6.  Hypertension. Continued irbesartan. 7.  Depression. Continued Celexa.  CONSULTANTS IN THE HOSPITAL: Dr. Arther Dames for gastroenterology.   IMPORTANT DIAGNOSTIC DATA: Chest x-ray, PA and lateral, shows chronic basilar right perihilar midlung changes.   Blood culture was negative.   BNP was 699 on admission. WBC 7.2, hemoglobin 8.3, platelet count 213,000 and MCV 90.   Creatinine 1.32 on admission, sodium 138, potassium 4.1, chloride 102, and CO2 30.   Troponin less than 0.02.   Hemoglobin dropped down to 7.7 on 7th February and 7.5 on 8th February, so  after transfusion on 9th February  hemoglobin came up to 9.5.   TOTAL TIME SPENT ON THIS DISCHARGE: 40 minutes.  ____________________________ Ceasar Lund Anselm Jungling, MD vgv:sb D: 04/19/2014 08:53:30 ET T: 04/19/2014 12:08:14 ET JOB#: [id number]  cc: Ceasar Lund. Anselm Jungling, MD, <Dictator> Arther Dames, MD Venia Carbon, MD  Vaughan Basta MD ELECTRONICALLY SIGNED 04/24/2014 15:57

## 2014-07-07 NOTE — Consult Note (Signed)
PATIENT NAME:  [name], [name] MR#:  [id number] DATE OF BIRTH:  December 20, 1945  DATE OF CONSULTATION:  04/14/2014  REFERRING PHYSICIAN:  Fulton Reek, MD CONSULTING PHYSICIAN:  Arther Dames, MD  REASON FOR CONSULTATION: Anemia, positive FOBT.   HISTORY OF PRESENT ILLNESS: [name] is a 69 year old female with a past medical history notable for COPD and DM2 who is presenting to the hospital for evaluation of shortness of breath. In the setting of working this up, it was also noted that she does have a significant anemia. She also apparently had a stool FOBT done in the emergency room via DRE that was positive. [name] denies any rectal bleeding. Her husband thinks that he may have seen a dark stool or 2 about a month ago, but nothing recently. She is not having any trouble with weight loss. In terms of her endoscopic history, she did have a copy of her colonoscopy from 2013 done for a personal history of colon polyps. On this study, she did have a large ulcer on the ileocecal valve, although per their report, the biopsies were not concerning. She also reports an upper endoscopy at some point in the past, although they are not sure of the date on that or the findings. She has never previously been aware of having trouble with anemia and is not on any p.o. iron therapy.   PAST MEDICAL HISTORY: 1. COPD.  2. DM2.  3. Lung cancer status post excision.  4. History of breast cancer.  5. History of diverticulitis.  6. Peripheral neuropathy.  7. Status post cholecystectomy.   MEDICATIONS AT HOME: 1. Vitamin D3 - 5000 units daily.  2. Spiriva 1 inhaled daily.  3. Albuterol 2 puffs every 6 hours p.r.n.  4. MiraLax 17 grams daily.  5. Omeprazole 20 mg daily.  6. Multivitamin 1 daily.  7. Singulair 10 mg daily.  8. Reglan 5 mg p.r.n.  9. Metformin 1000 mg b.i.d.  10. Lyrica 75 mg 3 times a day.  11. Irbesartan 150 mg daily.  12. Lasix 20 mg daily.  13. Dulera 5/200, 2 puffs b.i.d.   14. Flonase 2 puffs daily.  15. Allegra 100 mg daily.  16. Pepcid 20 mg at bedtime.  17. Aricept 10 mg daily.  18. Celexa 20 mg daily.  19. Lipitor 40 mg at bedtime.   ALLERGIES: PENICILLIN AND TAPE.   SOCIAL HISTORY: She lives with her husband. Former smoker. She does not drink.   FAMILY HISTORY: She has a daughter who has a Crohn's disease. She also has a father who the husband thinks may have had a small bowel malignancy.   REVIEW OF SYSTEMS: A 10 system review is conducted. It is negative except as stated in the HPI.   PHYSICAL EXAMINATION: VITAL SIGNS: Temperature 96.9, pulse 77, respirations are 20. Blood pressure is 91/52, pulse oximetry is 95% on 2 liters.   GENERAL: Alert and oriented times 4.  No acute distress. Appears stated age.  HEENT: Normocephalic/atraumatic. Extraocular movements are intact. Anicteric.  NECK: Soft, supple. JVP appears normal. No adenopathy.  CHEST: Clear to auscultation. No wheeze or crackle. Respirations unlabored.  HEART: Regular. No murmur, rub, or gallop.  Normal S1 and S2.  ABDOMEN: Soft, nontender, nondistended.  Normal active bowel sounds in all four quadrants.  No organomegaly. No masses  EXTREMITIES: No swelling, well perfused.  SKIN: No rash or lesion. Skin color, texture, turgor normal.  NEUROLOGICAL: Grossly intact.  PSYCHIATRIC: Normal tone and affect.  MUSCULOSKELETAL: No joint  swelling or erythema.   LABORATORY DATA: Notable for her sodium 139, potassium 5.1, BUN 17, creatinine 1.28. Her BNP is 699. Her iron studies, her iron is 16, ferritin is 37, TIBC is 263, percent saturation is 6. Liver enzymes are normal but her albumin is low at 2.3. Cardiac enzymes are normal. White count is 6, hemoglobin is 7.7, hematocrit 24, platelets 220,000, MCV is 89.   ASSESSMENT AND PLAN: Anemia, positive fecal occult blood test: She does have an anemia on labs. Her iron studies are somewhat mixed. Her iron saturation and iron are low, while  her ferritin is normal. It does appear to be a bit of a mixed iron deficiency and anemia of chronic disease.   RECOMMENDATIONS: 1. Given her history of a large ileocecal ulcer on her previous colonoscopy in 2013, this would raise my suspicion for the possibility of Crohn's disease or progression of this ulcer. Although her iron studies are mixed, I would recommend going ahead with a colonoscopy in light of this. I would also recommend an upper endoscopy at the same time, given the partial iron deficiency anemia and the dark stools that were noted a month ago. [name] and her husband do need to take time to decide whether they want to have this done as an inpatient or an outpatient. They were followed by a gastroenterologist in [place] and would like to stick with that gastroenterologist. They will discuss this also with their daughter and decide whether they would like to have these studies done as an inpatient or outpatient.  2. Follow hemoglobin until stable.  3. Further recommendations pending above.   Thank you for this consult.     ____________________________ Arther Dames, MD mr:TT D: 04/14/2014 15:33:15 ET T: 04/14/2014 16:36:35 ET JOB#: [id number]  cc: Arther Dames, MD, <Dictator> Mellody Life MD ELECTRONICALLY SIGNED 04/22/2014 15:13

## 2014-07-08 ENCOUNTER — Telehealth: Payer: Self-pay | Admitting: Internal Medicine

## 2014-07-08 NOTE — Telephone Encounter (Signed)
Notes on your desk now

## 2014-07-08 NOTE — Telephone Encounter (Signed)
I do not see any notes in my in box...Marland KitchenMarland Kitchen

## 2014-07-08 NOTE — Telephone Encounter (Signed)
Pt had her consult with the [facility] clinic on 07/02/14 with Dr. Beverley Fiedler. Pt called our office on 07/03/14 and request we order another MRI of the brain so that Dr. Warner Mccreedy at [facility] can compare the two. The pt has a follow up in 6 months with Dr. Warner Mccreedy. I explained to the patient that if Dr. Warner Mccreedy is requesting an MRI then their office should be able to do so but if we are going to order this test then we will need the ov notes. I have requested these notes and in your inbox for review. Pt also states it needs to be in the same MRI machine. Please advise.

## 2014-07-09 NOTE — Telephone Encounter (Signed)
I reviewed Dr Haze Boyden note and he did not specifically request another MRI He was concerned due to recent falls that perhaps she had a bleed on the brain (around the skull). This can be found with an easier CT scan but unless she hit her head, I don't think it is needed If she is having headache, or ongoing trouble walking, I would like to see her to review things and decide about further studies

## 2014-07-09 NOTE — Telephone Encounter (Signed)
.  left message to have patient return my call.  

## 2014-07-09 NOTE — Telephone Encounter (Signed)
Spoke with patient and advised Dr. Alla German results and pt wanted to know if Dr. Warner Mccreedy suggested a MRI can she have it? I advised that Dr. Warner Mccreedy would have to order it or she could schedule an appt to see Dr. Silvio Pate to discuss. Pt states she will call Dr. Warner Mccreedy to see his advice first.

## 2014-07-12 DIAGNOSIS — J449 Chronic obstructive pulmonary disease, unspecified: Secondary | ICD-10-CM | POA: Diagnosis not present

## 2014-07-12 DIAGNOSIS — R269 Unspecified abnormalities of gait and mobility: Secondary | ICD-10-CM | POA: Diagnosis not present

## 2014-07-12 DIAGNOSIS — M6281 Muscle weakness (generalized): Secondary | ICD-10-CM | POA: Diagnosis not present

## 2014-07-16 ENCOUNTER — Other Ambulatory Visit: Payer: Self-pay | Admitting: Internal Medicine

## 2014-07-22 DIAGNOSIS — R2681 Unsteadiness on feet: Secondary | ICD-10-CM | POA: Diagnosis not present

## 2014-07-22 DIAGNOSIS — G3184 Mild cognitive impairment, so stated: Secondary | ICD-10-CM | POA: Diagnosis not present

## 2014-07-22 DIAGNOSIS — R413 Other amnesia: Secondary | ICD-10-CM | POA: Diagnosis not present

## 2014-07-24 ENCOUNTER — Other Ambulatory Visit: Payer: Self-pay

## 2014-07-24 MED ORDER — MOMETASONE FURO-FORMOTEROL FUM 200-5 MCG/ACT IN AERO: INHALATION_SPRAY | RESPIRATORY_TRACT | Status: DC

## 2014-07-24 NOTE — Telephone Encounter (Signed)
Approved:okay to refill for a year

## 2014-07-24 NOTE — Telephone Encounter (Signed)
rx sent to pharmacy by e-script [facility]

## 2014-07-24 NOTE — Telephone Encounter (Signed)
Pt left v/m requesting refill dulera to [facility] and one refill to [facility] [address]; pt is out of med. Dr Melvyn Novas had previously prescribed but pt said she does not plan to continue seeing Dr Melvyn Novas and request filled by Dr Silvio Pate. Pt had annual exam on 04/29/14.Please advise.

## 2014-07-26 DIAGNOSIS — J449 Chronic obstructive pulmonary disease, unspecified: Secondary | ICD-10-CM | POA: Diagnosis not present

## 2014-07-26 DIAGNOSIS — R269 Unspecified abnormalities of gait and mobility: Secondary | ICD-10-CM | POA: Diagnosis not present

## 2014-07-26 DIAGNOSIS — M6281 Muscle weakness (generalized): Secondary | ICD-10-CM | POA: Diagnosis not present

## 2014-07-30 DIAGNOSIS — M6281 Muscle weakness (generalized): Secondary | ICD-10-CM | POA: Diagnosis not present

## 2014-07-30 DIAGNOSIS — J449 Chronic obstructive pulmonary disease, unspecified: Secondary | ICD-10-CM | POA: Diagnosis not present

## 2014-07-30 DIAGNOSIS — R269 Unspecified abnormalities of gait and mobility: Secondary | ICD-10-CM | POA: Diagnosis not present

## 2014-08-09 DIAGNOSIS — J449 Chronic obstructive pulmonary disease, unspecified: Secondary | ICD-10-CM | POA: Diagnosis not present

## 2014-08-09 DIAGNOSIS — R269 Unspecified abnormalities of gait and mobility: Secondary | ICD-10-CM | POA: Diagnosis not present

## 2014-08-09 DIAGNOSIS — M6281 Muscle weakness (generalized): Secondary | ICD-10-CM | POA: Diagnosis not present

## 2014-08-14 DIAGNOSIS — M1611 Unilateral primary osteoarthritis, right hip: Secondary | ICD-10-CM | POA: Diagnosis not present

## 2014-08-14 DIAGNOSIS — M5416 Radiculopathy, lumbar region: Secondary | ICD-10-CM | POA: Diagnosis not present

## 2014-08-16 DIAGNOSIS — R269 Unspecified abnormalities of gait and mobility: Secondary | ICD-10-CM | POA: Diagnosis not present

## 2014-08-16 DIAGNOSIS — M6281 Muscle weakness (generalized): Secondary | ICD-10-CM | POA: Diagnosis not present

## 2014-08-16 DIAGNOSIS — J449 Chronic obstructive pulmonary disease, unspecified: Secondary | ICD-10-CM | POA: Diagnosis not present

## 2014-08-23 DIAGNOSIS — M6281 Muscle weakness (generalized): Secondary | ICD-10-CM | POA: Diagnosis not present

## 2014-08-23 DIAGNOSIS — J449 Chronic obstructive pulmonary disease, unspecified: Secondary | ICD-10-CM | POA: Diagnosis not present

## 2014-08-23 DIAGNOSIS — R269 Unspecified abnormalities of gait and mobility: Secondary | ICD-10-CM | POA: Diagnosis not present

## 2014-08-27 DIAGNOSIS — D500 Iron deficiency anemia secondary to blood loss (chronic): Secondary | ICD-10-CM | POA: Diagnosis not present

## 2014-08-27 DIAGNOSIS — Z8719 Personal history of other diseases of the digestive system: Secondary | ICD-10-CM | POA: Diagnosis not present

## 2014-08-28 ENCOUNTER — Telehealth: Payer: Self-pay

## 2014-08-28 NOTE — Telephone Encounter (Signed)
Diabetic Bundle. I contacted the pt and advised her A1C blood test is due. Pt stated she wanted to wait until she saw Dr. Lubertha Sayres to have her blood test done. Pt did not want to make a appointment at this time.

## 2014-09-02 ENCOUNTER — Other Ambulatory Visit: Payer: Self-pay

## 2014-09-06 ENCOUNTER — Ambulatory Visit: Payer: Medicare Other

## 2014-09-10 ENCOUNTER — Ambulatory Visit: Payer: Medicare Other

## 2014-09-10 DIAGNOSIS — R269 Unspecified abnormalities of gait and mobility: Secondary | ICD-10-CM | POA: Diagnosis not present

## 2014-09-10 DIAGNOSIS — M6281 Muscle weakness (generalized): Secondary | ICD-10-CM | POA: Diagnosis not present

## 2014-09-10 DIAGNOSIS — J449 Chronic obstructive pulmonary disease, unspecified: Secondary | ICD-10-CM | POA: Diagnosis not present

## 2014-09-18 DIAGNOSIS — D649 Anemia, unspecified: Secondary | ICD-10-CM | POA: Diagnosis not present

## 2014-09-18 DIAGNOSIS — E118 Type 2 diabetes mellitus with unspecified complications: Secondary | ICD-10-CM | POA: Diagnosis not present

## 2014-09-18 DIAGNOSIS — E789 Disorder of lipoprotein metabolism, unspecified: Secondary | ICD-10-CM | POA: Diagnosis not present

## 2014-09-19 DIAGNOSIS — R269 Unspecified abnormalities of gait and mobility: Secondary | ICD-10-CM | POA: Diagnosis not present

## 2014-09-19 DIAGNOSIS — J449 Chronic obstructive pulmonary disease, unspecified: Secondary | ICD-10-CM | POA: Diagnosis not present

## 2014-09-19 DIAGNOSIS — M6281 Muscle weakness (generalized): Secondary | ICD-10-CM | POA: Diagnosis not present

## 2014-09-24 DIAGNOSIS — E118 Type 2 diabetes mellitus with unspecified complications: Secondary | ICD-10-CM | POA: Diagnosis not present

## 2014-09-24 DIAGNOSIS — K219 Gastro-esophageal reflux disease without esophagitis: Secondary | ICD-10-CM | POA: Diagnosis not present

## 2014-09-24 DIAGNOSIS — D649 Anemia, unspecified: Secondary | ICD-10-CM | POA: Diagnosis not present

## 2014-09-24 DIAGNOSIS — G629 Polyneuropathy, unspecified: Secondary | ICD-10-CM | POA: Diagnosis not present

## 2014-09-25 ENCOUNTER — Ambulatory Visit: Payer: Medicare Other

## 2014-10-14 DIAGNOSIS — D509 Iron deficiency anemia, unspecified: Secondary | ICD-10-CM | POA: Diagnosis not present

## 2014-10-14 DIAGNOSIS — Z13220 Encounter for screening for lipoid disorders: Secondary | ICD-10-CM | POA: Diagnosis not present

## 2014-10-14 LAB — LIPID PANEL
Cholesterol: 232 mg/dL — AB (ref 0–200)
HDL: 46 mg/dL (ref 35–70)
LDL CALC: 161 mg/dL
Triglycerides: 130 mg/dL (ref 40–160)

## 2014-10-17 ENCOUNTER — Encounter: Payer: Self-pay | Admitting: Internal Medicine

## 2014-10-17 DIAGNOSIS — E785 Hyperlipidemia, unspecified: Secondary | ICD-10-CM

## 2014-10-17 MED ORDER — PRAVASTATIN SODIUM 40 MG PO TABS: 40.0000 mg | ORAL_TABLET | Freq: Every day | ORAL | Status: DC

## 2014-10-17 NOTE — Telephone Encounter (Signed)
Spoke with patient and advised results  Lab appt scheduled

## 2014-10-17 NOTE — Telephone Encounter (Signed)
Please call her to set up blood work in about 6-8 weeks

## 2014-10-28 ENCOUNTER — Ambulatory Visit
Admission: RE | Admit: 2014-10-28 | Discharge: 2014-10-28 | Disposition: A | Discharge status: Home / Self Care | Payer: Medicare Other | Source: Ambulatory Visit

## 2014-10-28 DIAGNOSIS — Z1231 Encounter for screening mammogram for malignant neoplasm of breast: Secondary | ICD-10-CM

## 2014-10-31 DIAGNOSIS — L03113 Cellulitis of right upper limb: Secondary | ICD-10-CM | POA: Diagnosis not present

## 2014-11-01 ENCOUNTER — Encounter: Payer: Self-pay | Admitting: Emergency Medicine

## 2014-11-01 ENCOUNTER — Emergency Department: Payer: Medicare Other

## 2014-11-01 ENCOUNTER — Emergency Department
Admission: EM | Admit: 2014-11-01 | Discharge: 2014-11-01 | Disposition: A | Discharge status: Home / Self Care | Payer: Medicare Other | Attending: Emergency Medicine | Admitting: Emergency Medicine

## 2014-11-01 DIAGNOSIS — R609 Edema, unspecified: Secondary | ICD-10-CM

## 2014-11-01 DIAGNOSIS — E1142 Type 2 diabetes mellitus with diabetic polyneuropathy: Secondary | ICD-10-CM | POA: Diagnosis not present

## 2014-11-01 DIAGNOSIS — L089 Local infection of the skin and subcutaneous tissue, unspecified: Secondary | ICD-10-CM | POA: Diagnosis present

## 2014-11-01 DIAGNOSIS — Z880 Allergy status to penicillin: Secondary | ICD-10-CM | POA: Diagnosis not present

## 2014-11-01 DIAGNOSIS — Z87891 Personal history of nicotine dependence: Secondary | ICD-10-CM | POA: Insufficient documentation

## 2014-11-01 DIAGNOSIS — L03113 Cellulitis of right upper limb: Secondary | ICD-10-CM | POA: Insufficient documentation

## 2014-11-01 DIAGNOSIS — M79641 Pain in right hand: Secondary | ICD-10-CM | POA: Diagnosis not present

## 2014-11-01 DIAGNOSIS — M7989 Other specified soft tissue disorders: Secondary | ICD-10-CM | POA: Diagnosis not present

## 2014-11-01 DIAGNOSIS — R52 Pain, unspecified: Secondary | ICD-10-CM

## 2014-11-01 LAB — COMPREHENSIVE METABOLIC PANEL
ALT: 20 U/L (ref 14–54)
AST: 19 U/L (ref 15–41)
Albumin: 3.6 g/dL (ref 3.5–5.0)
Alkaline Phosphatase: 61 U/L (ref 38–126)
Anion gap: 9 (ref 5–15)
BUN: 21 mg/dL — ABNORMAL HIGH (ref 6–20)
CO2: 25 mmol/L (ref 22–32)
Calcium: 9.4 mg/dL (ref 8.9–10.3)
Chloride: 107 mmol/L (ref 101–111)
Creatinine, Ser: 1.17 mg/dL — ABNORMAL HIGH (ref 0.44–1.00)
GFR, EST AFRICAN AMERICAN: 53 mL/min — AB (ref >60)
GFR, EST NON AFRICAN AMERICAN: 46 mL/min — AB (ref >60)
Glucose, Bld: 105 mg/dL — ABNORMAL HIGH (ref 65–99)
POTASSIUM: 3.8 mmol/L (ref 3.5–5.1)
Sodium: 141 mmol/L (ref 135–145)
TOTAL PROTEIN: 7.1 g/dL (ref 6.5–8.1)
Total Bilirubin: 0.5 mg/dL (ref 0.3–1.2)

## 2014-11-01 LAB — CBC WITH DIFFERENTIAL/PLATELET
BASOS ABS: 0 10*3/uL (ref 0–0.1)
Basophils Relative: 0 %
EOS PCT: 1 %
Eosinophils Absolute: 0.1 10*3/uL (ref 0–0.7)
HCT: 38.4 % (ref 35.0–47.0)
Hemoglobin: 12.5 g/dL (ref 12.0–16.0)
Lymphocytes Relative: 24 %
Lymphs Abs: 3.1 10*3/uL (ref 1.0–3.6)
MCH: 29.1 pg (ref 26.0–34.0)
MCHC: 32.6 g/dL (ref 32.0–36.0)
MCV: 89.3 fL (ref 80.0–100.0)
MONOS PCT: 6 %
Monocytes Absolute: 0.8 10*3/uL (ref 0.2–0.9)
Neutro Abs: 8.9 10*3/uL — ABNORMAL HIGH (ref 1.4–6.5)
Neutrophils Relative %: 69 %
PLATELETS: 196 10*3/uL (ref 150–440)
RBC: 4.3 MIL/uL (ref 3.80–5.20)
RDW: 15.6 % — AB (ref 11.5–14.5)
WBC: 12.9 10*3/uL — ABNORMAL HIGH (ref 3.6–11.0)

## 2014-11-01 MED ORDER — MUPIROCIN CALCIUM 2 % EX CREA: TOPICAL_CREAM | CUTANEOUS | Status: DC

## 2014-11-01 MED ORDER — VANCOMYCIN HCL IN DEXTROSE 1-5 GM/200ML-% IV SOLN
1000.0000 mg | Freq: Once | INTRAVENOUS | Status: AC
  Administered 2014-11-01: 1000 mg via INTRAVENOUS
  Filled 2014-11-01: qty 200

## 2014-11-01 MED ORDER — CLINDAMYCIN HCL 300 MG PO CAPS: 300.0000 mg | ORAL_CAPSULE | Freq: Three times a day (TID) | ORAL | Status: DC

## 2014-11-01 NOTE — Discharge Instructions (Signed)
Cellulitis Cellulitis is an infection of the skin and the tissue beneath it. The infected area is usually red and tender. Cellulitis occurs most often in the arms and lower legs.  CAUSES  Cellulitis is caused by bacteria that enter the skin through cracks or cuts in the skin. The most common types of bacteria that cause cellulitis are staphylococci and streptococci. SIGNS AND SYMPTOMS   Redness and warmth.  Swelling.  Tenderness or pain.  Fever. DIAGNOSIS  Your health care provider can usually determine what is wrong based on a physical exam. Blood tests may also be done. TREATMENT  Treatment usually involves taking an antibiotic medicine. HOME CARE INSTRUCTIONS   Take your antibiotic medicine as directed by your health care provider. Finish the antibiotic even if you start to feel better.  Keep the infected arm or leg elevated to reduce swelling.  Apply a warm cloth to the affected area up to 4 times per day to relieve pain.  Take medicines only as directed by your health care provider.  Keep all follow-up visits as directed by your health care provider. SEEK MEDICAL CARE IF:   You notice red streaks coming from the infected area.  Your red area gets larger or turns dark in color.  Your bone or joint underneath the infected area becomes painful after the skin has healed.  Your infection returns in the same area or another area.  You notice a swollen bump in the infected area.  You develop new symptoms.  You have a fever. SEEK IMMEDIATE MEDICAL CARE IF:   You feel very sleepy.  You develop vomiting or diarrhea.  You have a general ill feeling (malaise) with muscle aches and pains. MAKE SURE YOU:   Understand these instructions.  Will watch your condition.  Will get help right away if you are not doing well or get worse. Document Released: 12/02/2004 Document Revised: 07/09/2013 Document Reviewed: 05/10/2011 [facility] Patient Information 2015 [place], [place].  This information is not intended to replace advice given to you by your health care provider. Make sure you discuss any questions you have with your health care provider.   Stop taking the Doxycycline, switch to Clindamycin

## 2014-11-01 NOTE — ED Provider Notes (Signed)
[facility] Emergency Department Provider Note     Time seen: ----------------------------------------- 2:41 PM on 11/01/2014 -----------------------------------------    I have reviewed the triage vital signs and the nursing notes.   HISTORY  Chief Complaint Wound Infection    HPI [name] is a 69 y.o. female brought to ER for a scratch she sustained to her right hand 4 days ago. Patient states she taking oral antibiotics starting yesterdaybut she still having redness swelling and pain to the right hand. She denies fevers chills or other complaints.   Past Medical History  Diagnosis Date  . Renal cell carcinoma   . Asthma   . Diabetes mellitus type II   . GERD (gastroesophageal reflux disease)   . Gastroparesis   . Diverticulosis   . Anxiety   . Depression   . History of breast cancer   . History of adenomatous polyp of colon   . Hyperlipidemia   . COPD (chronic obstructive pulmonary disease)   . Pulmonary nodule     multiple  . Osteoarthritis   . Carcinoid tumor of lung 1991  . Mild cognitive impairment     Patient Active Problem List   Diagnosis Date Noted  . Edema 10/15/2013  . Diabetic polyneuropathy 02/24/2012  . Mild cognitive impairment   . Routine general medical examination at a health care facility 08/10/2010  . Type 2 diabetes, controlled, with neuropathy 05/16/2009  . OSTEOARTHRITIS 05/16/2009  . GASTROPARESIS 12/23/2008  . DIVERTICULOSIS-COLON 12/23/2008  . CONSTIPATION 12/23/2008  . COPD (chronic obstructive pulmonary disease) 02/15/2008  . PULMONARY FIBROSIS ILD POST INFLAMMATORY CHRONIC 01/03/2008  . HYPERLIPIDEMIA 12/22/2007  . ANXIETY 12/22/2007  . Episodic mood disorder 12/22/2007  . ALLERGIC RHINITIS 12/22/2007  . Multiple pulmonary nodules 12/22/2007  . GERD 12/22/2007  . BREAST CANCER, HX OF 12/22/2007  . COLONIC POLYPS, HX OF 12/22/2007    Past Surgical History  Procedure Laterality Date  .  Cholecystectomy  1975  . Tonsillectomy and adenoidectomy  1953  . Breast lumpectomy  1998    right breast  . Lung surgery  1994    carcinoid removal  . Laparoscopic nephrectomy  01/2009    right    Allergies Budesonide-formoterol fumarate; Lipitor; and Penicillins  Social History Social History  Substance Use Topics  . Smoking status: Former Smoker -- 1.00 packs/day for 30 years    Types: Cigarettes    Quit date: 03/08/1993  . Smokeless tobacco: Never Used  . Alcohol Use: No    Review of Systems Constitutional: Negative for fever. Eyes: Negative for visual changes. ENT: Negative for sore throat. Cardiovascular: Negative for chest pain. Respiratory: Negative for shortness of breath. Gastrointestinal: Negative for abdominal pain, vomiting and diarrhea. Genitourinary: Negative for dysuria. Musculoskeletal: Negative for back pain. Positive for right hand pain Skin: Positive for right hand redness and swelling Neurological: Negative for headaches, focal weakness or numbness.  10-point ROS otherwise negative.  ____________________________________________   PHYSICAL EXAM:  VITAL SIGNS: ED Triage Vitals  Enc Vitals Group     BP 11/01/14 1413 106/70 mmHg     Pulse Rate 11/01/14 1413 67     Resp 11/01/14 1413 16     Temp 11/01/14 1413 97.9 F (36.6 C)     Temp Source 11/01/14 1413 Oral     SpO2 11/01/14 1413 97 %     Weight 11/01/14 1413 131 lb (59.421 kg)     Height 11/01/14 1413 '5\' 3"'$  (1.6 m)     Head  Cir --      Peak Flow --      Pain Score 11/01/14 1408 5     Pain Loc --      Pain Edu? --      Excl. in [place]? --     Constitutional: Alert and oriented. Well appearing and in no distress. Eyes: Conjunctivae are normal. PERRL. Normal extraocular movements. Musculoskeletal: No limited range of motion of the right hand, there is obvious swelling and mild tenderness of the right hand Neurologic:  Normal speech and language. No gross focal neurologic deficits are  appreciated. Speech is normal. No gait instability. Skin: Right hand erythema and edema particularly over the dorsum. There is a superficial laceration noted over the lateral aspect in anatomical position.  ____________________________________________  ED COURSE:  Pertinent labs & imaging results that were available during my care of the patient were reviewed by me and considered in my medical decision making (see chart for details). Patient with mild wound infection, no signs of tenosynovitis or deep space infection of the hand. Will receive IV vancomycin. We have obtained a wound culture, she is stable for outpatient follow-up ____________________________________________    LABS (pertinent positives/negatives)  Labs Reviewed  CBC WITH DIFFERENTIAL/PLATELET - Abnormal; Notable for the following:    WBC 12.9 (*)    RDW 15.6 (*)    Neutro Abs 8.9 (*)    All other components within normal limits  WOUND CULTURE  COMPREHENSIVE METABOLIC PANEL    RADIOLOGY Images were viewed by me  Hand x-ray IMPRESSION: Soft tissue swelling only. ____________________________________________  FINAL ASSESSMENT AND PLAN  Cellulitis  Plan: Patient with labs and imaging as dictated above. Patient was given IV vancomycin. I will change her doxycycline to clindamycin, she continued to Flagyl as prescribed by her doctor. I think that the antibiotics have not really taken effect yet as she just started yesterday. She is stable for outpatient follow-up   Earleen Newport, MD   Earleen Newport, MD 11/01/14 1520

## 2014-11-01 NOTE — ED Notes (Signed)
Dog scratch to right hand 4 days ago.  States she has taken oral antibiotics but not improving.

## 2014-11-06 ENCOUNTER — Other Ambulatory Visit: Payer: Self-pay

## 2014-11-06 ENCOUNTER — Encounter: Payer: Self-pay | Admitting: *Deleted

## 2014-11-06 ENCOUNTER — Emergency Department
Admission: EM | Admit: 2014-11-06 | Discharge: 2014-11-06 | Disposition: A | Discharge status: Home / Self Care | Payer: Medicare Other | Attending: Emergency Medicine | Admitting: Emergency Medicine

## 2014-11-06 ENCOUNTER — Emergency Department: Payer: Medicare Other

## 2014-11-06 DIAGNOSIS — Z87891 Personal history of nicotine dependence: Secondary | ICD-10-CM | POA: Insufficient documentation

## 2014-11-06 DIAGNOSIS — S7001XA Contusion of right hip, initial encounter: Secondary | ICD-10-CM | POA: Diagnosis not present

## 2014-11-06 DIAGNOSIS — Z79899 Other long term (current) drug therapy: Secondary | ICD-10-CM | POA: Diagnosis not present

## 2014-11-06 DIAGNOSIS — S41111A Laceration without foreign body of right upper arm, initial encounter: Secondary | ICD-10-CM

## 2014-11-06 DIAGNOSIS — E1142 Type 2 diabetes mellitus with diabetic polyneuropathy: Secondary | ICD-10-CM | POA: Insufficient documentation

## 2014-11-06 DIAGNOSIS — Z880 Allergy status to penicillin: Secondary | ICD-10-CM | POA: Diagnosis not present

## 2014-11-06 DIAGNOSIS — Y9301 Activity, walking, marching and hiking: Secondary | ICD-10-CM | POA: Diagnosis not present

## 2014-11-06 DIAGNOSIS — Y998 Other external cause status: Secondary | ICD-10-CM | POA: Insufficient documentation

## 2014-11-06 DIAGNOSIS — S41101A Unspecified open wound of right upper arm, initial encounter: Secondary | ICD-10-CM | POA: Diagnosis not present

## 2014-11-06 DIAGNOSIS — Z7951 Long term (current) use of inhaled steroids: Secondary | ICD-10-CM | POA: Diagnosis not present

## 2014-11-06 DIAGNOSIS — M25521 Pain in right elbow: Secondary | ICD-10-CM | POA: Diagnosis not present

## 2014-11-06 DIAGNOSIS — S79911A Unspecified injury of right hip, initial encounter: Secondary | ICD-10-CM | POA: Diagnosis present

## 2014-11-06 DIAGNOSIS — S59901A Unspecified injury of right elbow, initial encounter: Secondary | ICD-10-CM | POA: Diagnosis not present

## 2014-11-06 DIAGNOSIS — W010XXA Fall on same level from slipping, tripping and stumbling without subsequent striking against object, initial encounter: Secondary | ICD-10-CM | POA: Diagnosis not present

## 2014-11-06 DIAGNOSIS — Z792 Long term (current) use of antibiotics: Secondary | ICD-10-CM | POA: Diagnosis not present

## 2014-11-06 DIAGNOSIS — Y9289 Other specified places as the place of occurrence of the external cause: Secondary | ICD-10-CM | POA: Diagnosis not present

## 2014-11-06 LAB — WOUND CULTURE: SPECIAL REQUESTS: NORMAL

## 2014-11-06 NOTE — ED Notes (Signed)
Pt to triage via wheelchair.  Pt fell at home today.  Pt has right hip pain with swelling and bruising.  Pt has right elbow pain with skin tears also.  Pt states she was walking and just fell.

## 2014-11-06 NOTE — ED Provider Notes (Signed)
[facility] Emergency Department Provider Note  ____________________________________________  Time seen: 9:30 PM  I have reviewed the triage vital signs and the nursing notes.   HISTORY  Chief Complaint Hip Pain    HPI [name] is a 69 y.o. female who comes to the ED after a fall at home today. She states that she was walking and tripped over an electrical cord. She fell onto her right side and has pain in the right hip and elbow. She doesn't think her she hit her head, no loss of consciousness. No headache or neck pain. No chest pain shortness of breath fever chills nausea vomiting vision changes numbness tingling or weakness.     Past Medical History  Diagnosis Date  . Renal cell carcinoma   . Asthma   . Diabetes mellitus type II   . GERD (gastroesophageal reflux disease)   . Gastroparesis   . Diverticulosis   . Anxiety   . Depression   . History of breast cancer   . History of adenomatous polyp of colon   . Hyperlipidemia   . COPD (chronic obstructive pulmonary disease)   . Pulmonary nodule     multiple  . Osteoarthritis   . Carcinoid tumor of lung 1991  . Mild cognitive impairment      Patient Active Problem List   Diagnosis Date Noted  . Edema 10/15/2013  . Diabetic polyneuropathy 02/24/2012  . Mild cognitive impairment   . Routine general medical examination at a health care facility 08/10/2010  . Type 2 diabetes, controlled, with neuropathy 05/16/2009  . OSTEOARTHRITIS 05/16/2009  . GASTROPARESIS 12/23/2008  . DIVERTICULOSIS-COLON 12/23/2008  . CONSTIPATION 12/23/2008  . COPD (chronic obstructive pulmonary disease) 02/15/2008  . PULMONARY FIBROSIS ILD POST INFLAMMATORY CHRONIC 01/03/2008  . HYPERLIPIDEMIA 12/22/2007  . ANXIETY 12/22/2007  . Episodic mood disorder 12/22/2007  . ALLERGIC RHINITIS 12/22/2007  . Multiple pulmonary nodules 12/22/2007  . GERD 12/22/2007  . BREAST CANCER, HX OF 12/22/2007  . COLONIC  POLYPS, HX OF 12/22/2007     Past Surgical History  Procedure Laterality Date  . Cholecystectomy  1975  . Tonsillectomy and adenoidectomy  1953  . Breast lumpectomy  1998    right breast  . Lung surgery  1994    carcinoid removal  . Laparoscopic nephrectomy  01/2009    right     Current Outpatient Rx  Name  Route  Sig  Dispense  Refill  . albuterol (PROAIR HFA) 108 (90 BASE) MCG/ACT inhaler   Inhalation   Inhale 2 puffs into the lungs every 6 (six) hours as needed for wheezing or shortness of breath.   18 g   0   . Cholecalciferol (VITAMIN D3) 5000 UNITS CAPS   Oral   Take 1 capsule by mouth every morning.         . citalopram (CELEXA) 20 MG tablet   Oral   Take 1 tablet (20 mg total) by mouth daily.   14 tablet   0   . clindamycin (CLEOCIN) 300 MG capsule   Oral   Take 1 capsule (300 mg total) by mouth 3 (three) times daily.   30 capsule   0   . donepezil (ARICEPT) 10 MG tablet   Oral   Take 1 tablet (10 mg total) by mouth at bedtime.   90 tablet   3   . ferrous sulfate 325 (65 FE) MG tablet   Oral   Take by mouth.         Marland Kitchen  fexofenadine (ALLEGRA) 180 MG tablet   Oral   Take 180 mg by mouth daily as needed (nasal drainage).         . fluticasone (FLONASE) 50 MCG/ACT nasal spray      USE 2 SPRAYS NASALLY DAILY   48 g   11   . furosemide (LASIX) 20 MG tablet      TAKE 1 TABLET BY MOUTH DAILY AS NEEDED FOR SWELLING   30 tablet   0   . glucose blood (FREESTYLE TEST STRIPS) test strip      Use as instructed   100 each   12     DX E11.40   . L-Methylfolate-Algae-B12-B6 (METANX) 3-90.314-2-35 MG CAPS   Oral   Take 180 mg by mouth 2 (two) times daily.         Marland Kitchen LYRICA 75 MG capsule   Oral   Take 75 mg by mouth 3 (three) times daily.          . metformin (FORTAMET) 1000 MG (OSM) 24 hr tablet   Oral   Take 1 tablet (1,000 mg total) by mouth 2 (two) times daily.   180 tablet   3   . metoCLOPramide (REGLAN) 5 MG tablet   Oral    Take 1 tablet (5 mg total) by mouth 4 (four) times daily -  before meals and at bedtime. Patient taking differently: Take 5 mg by mouth 4 (four) times daily as needed.    120 tablet   3   . mometasone-formoterol (DULERA) 200-5 MCG/ACT AERO      Take 2 puffs first thing in am and then another 2 puffs about 12 hours later.   1 Inhaler   0   . montelukast (SINGULAIR) 10 MG tablet      TAKE 1 TABLET BY MOUTH DAILY.   30 tablet   11   . Multiple Vitamin (MULTIVITAMIN) tablet   Oral   Take 1 tablet by mouth every morning.          . mupirocin cream (BACTROBAN) 2 %      Apply to affected area 3 times daily   30 g   0   . Omega-3 Fatty Acids (FISH OIL) 1200 MG CAPS   Oral   Take 1 capsule by mouth every morning. (place in the freezer)         . pantoprazole (PROTONIX) 40 MG tablet   Oral   Take by mouth.         . polyethylene glycol powder (GLYCOLAX/MIRALAX) powder      Take 17 grams (1 capful) every morning and 9 grams (1/2 capful) every evening   850 g   10   . pravastatin (PRAVACHOL) 40 MG tablet   Oral   Take 1 tablet (40 mg total) by mouth daily.   90 tablet   3   . SPIRIVA HANDIHALER 18 MCG inhalation capsule      INHALE THE CONTENTS OF ONE CAPSULE VIA HANDIHALER     DAILY   90 capsule   3   . vitamin C (ASCORBIC ACID) 500 MG tablet   Oral   Take 500 mg by mouth every morning.            Allergies Budesonide-formoterol fumarate; Lipitor; and Penicillins   Family History  Problem Relation Age of Onset  . Colon cancer Father   . Lung cancer Father   . Stroke Mother     Social History Social History  Substance Use Topics  . Smoking status: Former Smoker -- 1.00 packs/day for 30 years    Types: Cigarettes    Quit date: 03/08/1993  . Smokeless tobacco: Never Used  . Alcohol Use: No    Review of Systems  Constitutional:   No fever or chills. No weight changes Eyes:   No blurry vision or double vision.  ENT:   No sore  throat. Cardiovascular:   No chest pain. Respiratory:   No dyspnea or cough. Gastrointestinal:   Negative for abdominal pain, vomiting and diarrhea.  No BRBPR or melena. Genitourinary:   Negative for dysuria, urinary retention, bloody urine, or difficulty urinating. Musculoskeletal:   Right hip and elbow pain. Skin:   Negative for rash. Recent cellulitis of right hand which is now improved. Neurological:   Negative for headaches, focal weakness or numbness. Psychiatric:  No anxiety or depression.   Endocrine:  No hot/cold intolerance, changes in energy, or sleep difficulty.  10-point ROS otherwise negative.  ____________________________________________   PHYSICAL EXAM:  VITAL SIGNS: ED Triage Vitals  Enc Vitals Group     BP 11/06/14 1958 112/59 mmHg     Pulse Rate 11/06/14 1958 80     Resp 11/06/14 1958 18     Temp 11/06/14 1958 98.4 F (36.9 C)     Temp Source 11/06/14 1958 Oral     SpO2 11/06/14 1958 91 %     Weight 11/06/14 1958 132 lb (59.875 kg)     Height 11/06/14 1958 '5\' 4"'$  (1.626 m)     Head Cir --      Peak Flow --      Pain Score 11/06/14 2004 8     Pain Loc --      Pain Edu? --      Excl. in [place]? --      Constitutional:   Alert and oriented. Well appearing and in no distress. Eyes:   No scleral icterus. No conjunctival pallor. PERRL. EOMI ENT   Head:   Normocephalic and atraumatic.   Nose:   No congestion/rhinnorhea. No septal hematoma   Mouth/Throat:   MMM, no pharyngeal erythema. No peritonsillar mass. No uvula shift.   Neck:   No stridor. No SubQ emphysema. No meningismus. Hematological/Lymphatic/Immunilogical:   No cervical lymphadenopathy. Cardiovascular:   RRR. Normal and symmetric distal pulses are present in all extremities. No murmurs, rubs, or gallops. Respiratory:   Normal respiratory effort without tachypnea nor retractions. Breath sounds are clear and equal bilaterally. No wheezes/rales/rhonchi. Gastrointestinal:   Soft and  nontender. No distention. There is no CVA tenderness.  No rebound, rigidity, or guarding. Genitourinary:   deferred Musculoskeletal:   normal range of motion in all extremities. No joint effusions.  No edema. Right hip contusion and swelling over the greater trochanter. No deformity or instability. Right upper extremity has 2 superficial skin tears over the lateral elbow and mid forearm. Wounds are hemostatic Neurologic:   Normal speech and language.  CN 2-10 normal. Motor grossly intact. No pronator drift.  Normal gait. No gross focal neurologic deficits are appreciated.  Skin:    Skin is warm, dry and intact. No rash noted.  No petechiae, purpura, or bullae. Psychiatric:   Mood and affect are normal. Speech and behavior are normal. Patient exhibits appropriate insight and judgment.  ____________________________________________    LABS (pertinent positives/negatives) (all labs ordered are listed, but only abnormal results are displayed) Labs Reviewed - No data to display ____________________________________________   EKG  Interpreted by me  Normal sinus rhythm rate of 77, normal axis and intervals, normal QRS and ST segments and T waves  ____________________________________________    RADIOLOGY  X-ray right hip unremarkable X-ray right elbow unremarkable  ____________________________________________   PROCEDURES   ____________________________________________   INITIAL IMPRESSION / ASSESSMENT AND PLAN / ED COURSE  Pertinent labs & imaging results that were available during my care of the patient were reviewed by me and considered in my medical decision making (see chart for details).  Patient presents with contusions of the right forearm and right hip. She also has skin tears on the right arm. Skin tears were dressed with a clean dry bulky gauze dressing to protect them. Patient counseled on NSAID use for the contusions and musculoskeletal pain that is likely too  involved. She'll also use rest ice compression and elevation therapy and follow up with primary care. Low suspicion for any underlying cardiopulmonary or neurologic or infectious pathology that predispose the fall. It sounds like an innocent mechanical fall due to walking hazards in the home..     ____________________________________________   FINAL CLINICAL IMPRESSION(S) / ED DIAGNOSES  Final diagnoses:  Contusion of right hip, initial encounter  Skin tear of right upper arm without complication, initial encounter      Carrie Mew, MD 11/06/14 2152

## 2014-11-06 NOTE — Discharge Instructions (Signed)
Contusion A contusion is a deep bruise. Contusions are the result of an injury that caused bleeding under the skin. The contusion may turn blue, purple, or yellow. Minor injuries will give you a painless contusion, but more severe contusions may stay painful and swollen for a few weeks.  CAUSES  A contusion is usually caused by a blow, trauma, or direct force to an area of the body. SYMPTOMS   Swelling and redness of the injured area.  Bruising of the injured area.  Tenderness and soreness of the injured area.  Pain. DIAGNOSIS  The diagnosis can be made by taking a history and physical exam. An X-ray, CT scan, or MRI may be needed to determine if there were any associated injuries, such as fractures. TREATMENT  Specific treatment will depend on what area of the body was injured. In general, the best treatment for a contusion is resting, icing, elevating, and applying cold compresses to the injured area. Over-the-counter medicines may also be recommended for pain control. Ask your caregiver what the best treatment is for your contusion. HOME CARE INSTRUCTIONS   Put ice on the injured area.  Put ice in a plastic bag.  Place a towel between your skin and the bag.  Leave the ice on for 15-20 minutes, 3-4 times a day, or as directed by your health care provider.  Only take over-the-counter or prescription medicines for pain, discomfort, or fever as directed by your caregiver. Your caregiver may recommend avoiding anti-inflammatory medicines (aspirin, ibuprofen, and naproxen) for 48 hours because these medicines may increase bruising.  Rest the injured area.  If possible, elevate the injured area to reduce swelling. SEEK IMMEDIATE MEDICAL CARE IF:   You have increased bruising or swelling.  You have pain that is getting worse.  Your swelling or pain is not relieved with medicines. MAKE SURE YOU:   Understand these instructions.  Will watch your condition.  Will get help right  away if you are not doing well or get worse. Document Released: 12/02/2004 Document Revised: 02/27/2013 Document Reviewed: 12/28/2010 [facility] Patient Information 2015 [place], [place]. This information is not intended to replace advice given to you by your health care provider. Make sure you discuss any questions you have with your health care provider.  Skin Tear Care A skin tear is a wound in which the top layer of skin has peeled off. This is a common problem with aging because the skin becomes thinner and more fragile as a person gets older. In addition, some medicines, such as oral corticosteroids, can lead to skin thinning if taken for long periods of time.  A skin tear is often repaired with tape or skin adhesive strips. This keeps the skin that has been peeled off in contact with the healthier skin beneath. Depending on the location of the wound, a bandage (dressing) may be applied over the tape or skin adhesive strips. Sometimes, during the healing process, the skin turns black and dies. Even when this happens, the torn skin acts as a good dressing until the skin underneath gets healthier and repairs itself. HOME CARE INSTRUCTIONS   Change dressings once per day or as directed by your caregiver.  Gently clean the skin tear and the area around the tear using saline solution or mild soap and water.  Do not rub the injured skin dry. Let the area air dry.  Apply petroleum jelly or an antibiotic cream or ointment to keep the tear moist. This will help the wound heal. Do  not allow a scab to form.  If the dressing sticks before the next dressing change, moisten it with warm soapy water and gently remove it.  Protect the injured skin until it has healed.  Only take over-the-counter or prescription medicines as directed by your caregiver.  Take showers or baths using warm soapy water. Apply a new dressing after the shower or bath.  Keep all follow-up appointments as directed by your  caregiver.  SEEK IMMEDIATE MEDICAL CARE IF:   You have redness, swelling, or increasing pain in the skin tear.  You havepus coming from the skin tear.  You have chills.  You have a red streak that goes away from the skin tear.  You have a bad smell coming from the tear or dressing.  You have a fever or persistent symptoms for more than 2-3 days.  You have a fever and your symptoms suddenly get worse. MAKE SURE YOU:  Understand these instructions.  Will watch this condition.  Will get help right away if your child is not doing well or gets worse. Document Released: 11/17/2000 Document Revised: 11/17/2011 Document Reviewed: 09/06/2011 [facility] Patient Information 2015 [place], [place]. This information is not intended to replace advice given to you by your health care provider. Make sure you discuss any questions you have with your health care provider.

## 2014-11-07 LAB — HM DIABETES EYE EXAM

## 2014-11-08 ENCOUNTER — Telehealth: Payer: Self-pay | Admitting: *Deleted

## 2014-11-08 NOTE — Telephone Encounter (Signed)
Spoke with patient and she states she's doing fine and the hip will take time to heal.

## 2014-12-02 ENCOUNTER — Encounter: Payer: Self-pay | Admitting: Internal Medicine

## 2014-12-02 ENCOUNTER — Ambulatory Visit (INDEPENDENT_AMBULATORY_CARE_PROVIDER_SITE_OTHER): Payer: Medicare Other | Admitting: Internal Medicine

## 2014-12-02 ENCOUNTER — Ambulatory Visit (INDEPENDENT_AMBULATORY_CARE_PROVIDER_SITE_OTHER)
Admission: RE | Admit: 2014-12-02 | Discharge: 2014-12-02 | Disposition: A | Discharge status: Home / Self Care | Payer: Medicare Other | Source: Ambulatory Visit | Attending: Internal Medicine | Admitting: Internal Medicine

## 2014-12-02 VITALS — BP 116/60 | HR 72 | Temp 97.6°F | Wt 132.2 lb

## 2014-12-02 DIAGNOSIS — M25551 Pain in right hip: Secondary | ICD-10-CM

## 2014-12-02 DIAGNOSIS — Y92099 Unspecified place in other non-institutional residence as the place of occurrence of the external cause: Secondary | ICD-10-CM | POA: Diagnosis not present

## 2014-12-02 DIAGNOSIS — Z23 Encounter for immunization: Secondary | ICD-10-CM

## 2014-12-02 DIAGNOSIS — E785 Hyperlipidemia, unspecified: Secondary | ICD-10-CM

## 2014-12-02 DIAGNOSIS — Y92009 Unspecified place in unspecified non-institutional (private) residence as the place of occurrence of the external cause: Secondary | ICD-10-CM

## 2014-12-02 DIAGNOSIS — W19XXXA Unspecified fall, initial encounter: Secondary | ICD-10-CM

## 2014-12-02 NOTE — Progress Notes (Signed)
Subjective:    Patient ID: [name], female    DOB: [birth year], 69 y.o.   MRN: [id number]  HPI  Pt presents to the clinic today with c/o right hip pain. This started 2 weeks ago after a fall that occurred at her home. She was walking to the door and she tripped over the cord to the radio. She landed on her right sight. She did have some brusing which has improved. She has mild pain in the right shoulder and pain in the right hip. She was seen in the ER for the same directly after the fall. Xray of the right hand, elbow and hip were unremarkable. She is very concerned though, because the swelling to the right hip has gotten worse. It is slightly painful to touch but she reports she is not having any difficulty walking. She has not taken anything OTC.  Review of Systems      Past Medical History  Diagnosis Date  . Renal cell carcinoma   . Asthma   . Diabetes mellitus type II   . GERD (gastroesophageal reflux disease)   . Gastroparesis   . Diverticulosis   . Anxiety   . Depression   . History of breast cancer   . History of adenomatous polyp of colon   . Hyperlipidemia   . COPD (chronic obstructive pulmonary disease)   . Pulmonary nodule     multiple  . Osteoarthritis   . Carcinoid tumor of lung 1991  . Mild cognitive impairment     Current Outpatient Prescriptions  Medication Sig Dispense Refill  . albuterol (PROAIR HFA) 108 (90 BASE) MCG/ACT inhaler Inhale 2 puffs into the lungs every 6 (six) hours as needed for wheezing or shortness of breath. 18 g 0  . Cholecalciferol (VITAMIN D3) 5000 UNITS CAPS Take 1 capsule by mouth every morning.    . citalopram (CELEXA) 20 MG tablet Take 1 tablet (20 mg total) by mouth daily. 14 tablet 0  . ferrous sulfate 325 (65 FE) MG tablet Take by mouth.    . fexofenadine (ALLEGRA) 180 MG tablet Take 180 mg by mouth daily as needed (nasal drainage).    . fluticasone (FLONASE) 50 MCG/ACT nasal spray USE 2 SPRAYS NASALLY DAILY 48 g 11  .  furosemide (LASIX) 20 MG tablet TAKE 1 TABLET BY MOUTH DAILY AS NEEDED FOR SWELLING 30 tablet 0  . glucose blood (FREESTYLE TEST STRIPS) test strip Use as instructed 100 each 12  . L-Methylfolate-Algae-B12-B6 (METANX) 3-90.314-2-35 MG CAPS Take 180 mg by mouth 2 (two) times daily.    Marland Kitchen LYRICA 75 MG capsule Take 75 mg by mouth 3 (three) times daily.     . metformin (FORTAMET) 1000 MG (OSM) 24 hr tablet Take 1 tablet (1,000 mg total) by mouth 2 (two) times daily. 180 tablet 3  . metoCLOPramide (REGLAN) 5 MG tablet Take 1 tablet (5 mg total) by mouth 4 (four) times daily -  before meals and at bedtime. (Patient taking differently: Take 5 mg by mouth 4 (four) times daily as needed. ) 120 tablet 3  . mometasone-formoterol (DULERA) 200-5 MCG/ACT AERO Take 2 puffs first thing in am and then another 2 puffs about 12 hours later. 1 Inhaler 0  . montelukast (SINGULAIR) 10 MG tablet TAKE 1 TABLET BY MOUTH DAILY. 30 tablet 11  . Multiple Vitamin (MULTIVITAMIN) tablet Take 1 tablet by mouth every morning.     . Omega-3 Fatty Acids (FISH OIL) 1200 MG CAPS Take 1 capsule  by mouth every morning. (place in the freezer)    . pantoprazole (PROTONIX) 40 MG tablet Take by mouth.    . polyethylene glycol powder (GLYCOLAX/MIRALAX) powder Take 17 grams (1 capful) every morning and 9 grams (1/2 capful) every evening 850 g 10  . SPIRIVA HANDIHALER 18 MCG inhalation capsule INHALE THE CONTENTS OF ONE CAPSULE VIA HANDIHALER     DAILY 90 capsule 3  . pravastatin (PRAVACHOL) 40 MG tablet Take 1 tablet (40 mg total) by mouth daily. (Patient not taking: Reported on 12/02/2014) 90 tablet 3   No current facility-administered medications for this visit.    Allergies  Allergen Reactions  . Budesonide-Formoterol Fumarate Other (See Comments)    REACTION: coughed worse  . Lipitor [Atorvastatin] Other (See Comments)    Nosebleeds  . Penicillins Rash    REACTION: rash    Family History  Problem Relation Age of Onset  . Colon  cancer Father   . Lung cancer Father   . Stroke Mother     Social History   Social History  . Marital Status: Married    Spouse Name: N/A  . Number of Children: 2  . Years of Education: N/A   Occupational History  . retired Science writer    Social History Main Topics  . Smoking status: Former Smoker -- 1.00 packs/day for 30 years    Types: Cigarettes    Quit date: 03/08/1993  . Smokeless tobacco: Never Used  . Alcohol Use: No  . Drug Use: No  . Sexual Activity: Not on file   Other Topics Concern  . Not on file   Social History Narrative   No living will   Requests husband as health care POA   Would accept attempts at resuscitation   No tube feeds if cognitively unaware              Constitutional: Denies fever, malaise, fatigue, headache or abrupt weight changes.  Respiratory: Denies difficulty breathing, shortness of breath, cough or sputum production.   Cardiovascular: Denies chest pain, chest tightness, palpitations or swelling in the hands or feet.  Musculoskeletal: Pt reports right shoulder pain and right hip pain/swelling. Denies decrease in range of motion, difficulty with gait, muscle pain.  Skin: Pt reports bruising to right lower side. Denies redness, rashes, lesions or ulcercations.  Neurological: Denies dizziness, difficulty with memory, difficulty with speech or problems with balance and coordination. .  No other specific complaints in a complete review of systems (except as listed in HPI above).  Objective:   Physical Exam  BP 116/60 mmHg  Pulse 72  Temp(Src) 97.6 F (36.4 C) (Oral)  Wt 132 lb 4 oz (59.988 kg)  SpO2 94% Wt Readings from Last 3 Encounters:  12/02/14 132 lb 4 oz (59.988 kg)  11/06/14 132 lb (59.875 kg)  11/01/14 131 lb (59.421 kg)    General: Appears her stated age, well developed, well nourished in NAD. Skin: Warm, dry and intact. Ecchymosis noted of the RLE, seems to be improving. HEENT: Head: normal shape and size. ; No  apparent abrasions to the head. Cardiovascular: Normal rate and rhythm.  Pulmonary/Chest: Normal effort and diminshed vesicular breath sounds. No respiratory distress.  Musculoskeletal: Normal internal and external rotation of the right shoulder. No pain with palpation of the right shoulder. Negative drop can test. Normal internal and external ROM of the right hip. Baseball size hematoma directly over the right trochanter. Neurological: Alert and oriented.    BMET    Component Value  Date/Time   NA 141 11/01/2014 1445   NA 139 04/14/2014 0409   K 3.8 11/01/2014 1445   K 5.1 04/14/2014 0409   CL 107 11/01/2014 1445   CL 105 04/14/2014 0409   CO2 25 11/01/2014 1445   CO2 27 04/14/2014 0409   GLUCOSE 105* 11/01/2014 1445   GLUCOSE 181* 04/14/2014 0409   BUN 21* 11/01/2014 1445   BUN 17 04/14/2014 0409   CREATININE 1.17* 11/01/2014 1445   CREATININE 1.20* 04/16/2014 1031   CREATININE 1.28 04/14/2014 0409   CALCIUM 9.4 11/01/2014 1445   CALCIUM 8.7 04/14/2014 0409   GFRNONAA 46* 11/01/2014 1445   GFRNONAA 44* 04/14/2014 0409   GFRAA 53* 11/01/2014 1445   GFRAA 53* 04/14/2014 0409    Lipid Panel     Component Value Date/Time   CHOL 150 04/29/2014 1529   TRIG 236.0* 04/29/2014 1529   HDL 49.90 04/29/2014 1529   CHOLHDL 3 04/29/2014 1529   VLDL 47.2* 04/29/2014 1529   LDLCALC 83 03/14/2013 1119    CBC    Component Value Date/Time   WBC 12.9* 11/01/2014 1445   WBC 10.7 04/15/2014 0453   RBC 4.30 11/01/2014 1445   RBC 2.65* 04/15/2014 0453   HGB 12.5 11/01/2014 1445   HGB 7.5* 04/15/2014 0453   HCT 38.4 11/01/2014 1445   HCT 23.6* 04/15/2014 0453   PLT 196 11/01/2014 1445   PLT 284 04/15/2014 0453   MCV 89.3 11/01/2014 1445   MCV 89 04/15/2014 0453   MCH 29.1 11/01/2014 1445   MCH 28.4 04/15/2014 0453   MCHC 32.6 11/01/2014 1445   MCHC 32.0 04/15/2014 0453   RDW 15.6* 11/01/2014 1445   RDW 16.6* 04/15/2014 0453   LYMPHSABS 3.1 11/01/2014 1445   LYMPHSABS 1.6  04/15/2014 0453   MONOABS 0.8 11/01/2014 1445   MONOABS 0.4 04/15/2014 0453   EOSABS 0.1 11/01/2014 1445   EOSABS 0.0 04/15/2014 0453   BASOSABS 0.0 11/01/2014 1445   BASOSABS 0.0 04/15/2014 0453    Hgb A1C Lab Results  Component Value Date   HGBA1C 8.4* 04/29/2014         Assessment & Plan:   Right hip pain s/p fall at home:  Consulted with Dr. Lorelei Pont He feels like this is just a hematoma Will check xray of right hip again today per his recommendation Reassurance given that this will resolve on its own in 6-8 weeks Return precautions given- redness, heat, increased pain Discussed fall precautions and home safety  RTC as needed or if symptoms persist or worsen

## 2014-12-02 NOTE — Patient Instructions (Signed)

## 2014-12-02 NOTE — Progress Notes (Signed)
Pre visit review using our clinic review tool, if applicable. No additional management support is needed unless otherwise documented below in the visit note. 

## 2014-12-03 ENCOUNTER — Other Ambulatory Visit: Payer: Medicare Other

## 2014-12-03 LAB — LIPID PANEL
CHOL/HDL RATIO: 5
Cholesterol: 210 mg/dL — ABNORMAL HIGH (ref 0–200)
HDL: 45.4 mg/dL (ref >39.00)
LDL Cholesterol: 138 mg/dL — ABNORMAL HIGH (ref 0–99)
NONHDL: 164.24
Triglycerides: 129 mg/dL (ref 0.0–149.0)
VLDL: 25.8 mg/dL (ref 0.0–40.0)

## 2014-12-03 LAB — HEPATIC FUNCTION PANEL
ALBUMIN: 3.8 g/dL (ref 3.5–5.2)
ALK PHOS: 66 U/L (ref 39–117)
ALT: 16 U/L (ref 0–35)
AST: 20 U/L (ref 0–37)
BILIRUBIN DIRECT: 0.1 mg/dL (ref 0.0–0.3)
TOTAL PROTEIN: 6.5 g/dL (ref 6.0–8.3)
Total Bilirubin: 0.3 mg/dL (ref 0.2–1.2)

## 2014-12-03 NOTE — Addendum Note (Signed)
Addended by: Daralene Milch C on: 12/03/2014 10:09 AM   Modules accepted: Orders

## 2014-12-04 NOTE — Addendum Note (Signed)
Addended by: Despina Hidden on: 12/04/2014 03:07 PM   Modules accepted: Orders, Medications

## 2014-12-05 ENCOUNTER — Encounter: Payer: Self-pay | Admitting: Internal Medicine

## 2014-12-10 DIAGNOSIS — M810 Age-related osteoporosis without current pathological fracture: Secondary | ICD-10-CM | POA: Diagnosis not present

## 2014-12-10 DIAGNOSIS — F0390 Unspecified dementia without behavioral disturbance: Secondary | ICD-10-CM | POA: Diagnosis not present

## 2014-12-10 DIAGNOSIS — R2681 Unsteadiness on feet: Secondary | ICD-10-CM | POA: Diagnosis not present

## 2014-12-10 DIAGNOSIS — Z6822 Body mass index (BMI) 22.0-22.9, adult: Secondary | ICD-10-CM | POA: Diagnosis not present

## 2014-12-17 DIAGNOSIS — R2681 Unsteadiness on feet: Secondary | ICD-10-CM | POA: Diagnosis not present

## 2014-12-17 DIAGNOSIS — M85852 Other specified disorders of bone density and structure, left thigh: Secondary | ICD-10-CM | POA: Diagnosis not present

## 2014-12-17 DIAGNOSIS — M810 Age-related osteoporosis without current pathological fracture: Secondary | ICD-10-CM | POA: Diagnosis not present

## 2014-12-17 DIAGNOSIS — M85862 Other specified disorders of bone density and structure, left lower leg: Secondary | ICD-10-CM | POA: Diagnosis not present

## 2014-12-31 ENCOUNTER — Other Ambulatory Visit: Payer: Self-pay | Admitting: Internal Medicine

## 2015-01-03 DIAGNOSIS — J449 Chronic obstructive pulmonary disease, unspecified: Secondary | ICD-10-CM | POA: Diagnosis not present

## 2015-01-03 DIAGNOSIS — R269 Unspecified abnormalities of gait and mobility: Secondary | ICD-10-CM | POA: Diagnosis not present

## 2015-01-03 DIAGNOSIS — M6281 Muscle weakness (generalized): Secondary | ICD-10-CM | POA: Diagnosis not present

## 2015-01-09 DIAGNOSIS — R269 Unspecified abnormalities of gait and mobility: Secondary | ICD-10-CM | POA: Diagnosis not present

## 2015-01-09 DIAGNOSIS — J449 Chronic obstructive pulmonary disease, unspecified: Secondary | ICD-10-CM | POA: Diagnosis not present

## 2015-01-09 DIAGNOSIS — M6281 Muscle weakness (generalized): Secondary | ICD-10-CM | POA: Diagnosis not present

## 2015-01-21 DIAGNOSIS — J449 Chronic obstructive pulmonary disease, unspecified: Secondary | ICD-10-CM | POA: Diagnosis not present

## 2015-01-21 DIAGNOSIS — M6281 Muscle weakness (generalized): Secondary | ICD-10-CM | POA: Diagnosis not present

## 2015-01-21 DIAGNOSIS — R269 Unspecified abnormalities of gait and mobility: Secondary | ICD-10-CM | POA: Diagnosis not present

## 2015-02-04 DIAGNOSIS — R269 Unspecified abnormalities of gait and mobility: Secondary | ICD-10-CM | POA: Diagnosis not present

## 2015-02-04 DIAGNOSIS — J449 Chronic obstructive pulmonary disease, unspecified: Secondary | ICD-10-CM | POA: Diagnosis not present

## 2015-02-04 DIAGNOSIS — M6281 Muscle weakness (generalized): Secondary | ICD-10-CM | POA: Diagnosis not present

## 2015-02-11 DIAGNOSIS — J449 Chronic obstructive pulmonary disease, unspecified: Secondary | ICD-10-CM | POA: Diagnosis not present

## 2015-02-11 DIAGNOSIS — M6281 Muscle weakness (generalized): Secondary | ICD-10-CM | POA: Diagnosis not present

## 2015-02-11 DIAGNOSIS — R269 Unspecified abnormalities of gait and mobility: Secondary | ICD-10-CM | POA: Diagnosis not present

## 2015-03-11 DIAGNOSIS — M6281 Muscle weakness (generalized): Secondary | ICD-10-CM | POA: Diagnosis not present

## 2015-03-11 DIAGNOSIS — R269 Unspecified abnormalities of gait and mobility: Secondary | ICD-10-CM | POA: Diagnosis not present

## 2015-03-11 DIAGNOSIS — J449 Chronic obstructive pulmonary disease, unspecified: Secondary | ICD-10-CM | POA: Diagnosis not present

## 2015-03-18 DIAGNOSIS — M6281 Muscle weakness (generalized): Secondary | ICD-10-CM | POA: Diagnosis not present

## 2015-03-18 DIAGNOSIS — R269 Unspecified abnormalities of gait and mobility: Secondary | ICD-10-CM | POA: Diagnosis not present

## 2015-03-18 DIAGNOSIS — J449 Chronic obstructive pulmonary disease, unspecified: Secondary | ICD-10-CM | POA: Diagnosis not present

## 2015-03-20 DIAGNOSIS — E789 Disorder of lipoprotein metabolism, unspecified: Secondary | ICD-10-CM | POA: Diagnosis not present

## 2015-03-20 DIAGNOSIS — E118 Type 2 diabetes mellitus with unspecified complications: Secondary | ICD-10-CM | POA: Diagnosis not present

## 2015-03-26 DIAGNOSIS — J449 Chronic obstructive pulmonary disease, unspecified: Secondary | ICD-10-CM | POA: Diagnosis not present

## 2015-03-26 DIAGNOSIS — M6281 Muscle weakness (generalized): Secondary | ICD-10-CM | POA: Diagnosis not present

## 2015-03-26 DIAGNOSIS — R269 Unspecified abnormalities of gait and mobility: Secondary | ICD-10-CM | POA: Diagnosis not present

## 2015-03-27 DIAGNOSIS — E789 Disorder of lipoprotein metabolism, unspecified: Secondary | ICD-10-CM | POA: Diagnosis not present

## 2015-03-27 DIAGNOSIS — Z79899 Other long term (current) drug therapy: Secondary | ICD-10-CM | POA: Diagnosis not present

## 2015-03-27 DIAGNOSIS — E118 Type 2 diabetes mellitus with unspecified complications: Secondary | ICD-10-CM | POA: Diagnosis not present

## 2015-03-27 DIAGNOSIS — C3490 Malignant neoplasm of unspecified part of unspecified bronchus or lung: Secondary | ICD-10-CM | POA: Diagnosis not present

## 2015-03-27 DIAGNOSIS — G629 Polyneuropathy, unspecified: Secondary | ICD-10-CM | POA: Diagnosis not present

## 2015-04-01 ENCOUNTER — Other Ambulatory Visit: Payer: Self-pay | Admitting: Internal Medicine

## 2015-04-02 DIAGNOSIS — R269 Unspecified abnormalities of gait and mobility: Secondary | ICD-10-CM | POA: Diagnosis not present

## 2015-04-02 DIAGNOSIS — J449 Chronic obstructive pulmonary disease, unspecified: Secondary | ICD-10-CM | POA: Diagnosis not present

## 2015-04-02 DIAGNOSIS — M6281 Muscle weakness (generalized): Secondary | ICD-10-CM | POA: Diagnosis not present

## 2015-04-09 DIAGNOSIS — M6281 Muscle weakness (generalized): Secondary | ICD-10-CM | POA: Diagnosis not present

## 2015-04-09 DIAGNOSIS — J449 Chronic obstructive pulmonary disease, unspecified: Secondary | ICD-10-CM | POA: Diagnosis not present

## 2015-04-09 DIAGNOSIS — R269 Unspecified abnormalities of gait and mobility: Secondary | ICD-10-CM | POA: Diagnosis not present

## 2015-04-16 DIAGNOSIS — R269 Unspecified abnormalities of gait and mobility: Secondary | ICD-10-CM | POA: Diagnosis not present

## 2015-04-16 DIAGNOSIS — M6281 Muscle weakness (generalized): Secondary | ICD-10-CM | POA: Diagnosis not present

## 2015-04-16 DIAGNOSIS — J449 Chronic obstructive pulmonary disease, unspecified: Secondary | ICD-10-CM | POA: Diagnosis not present

## 2015-04-30 DIAGNOSIS — R269 Unspecified abnormalities of gait and mobility: Secondary | ICD-10-CM | POA: Diagnosis not present

## 2015-04-30 DIAGNOSIS — J449 Chronic obstructive pulmonary disease, unspecified: Secondary | ICD-10-CM | POA: Diagnosis not present

## 2015-04-30 DIAGNOSIS — M6281 Muscle weakness (generalized): Secondary | ICD-10-CM | POA: Diagnosis not present

## 2015-05-01 ENCOUNTER — Ambulatory Visit (INDEPENDENT_AMBULATORY_CARE_PROVIDER_SITE_OTHER): Payer: Medicare Other | Admitting: Obstetrics and Gynecology

## 2015-05-01 ENCOUNTER — Encounter: Payer: Self-pay | Admitting: Obstetrics and Gynecology

## 2015-05-01 VITALS — BP 133/70 | HR 74 | Ht 64.0 in | Wt 130.1 lb

## 2015-05-01 DIAGNOSIS — N952 Postmenopausal atrophic vaginitis: Secondary | ICD-10-CM | POA: Diagnosis not present

## 2015-05-01 DIAGNOSIS — N816 Rectocele: Secondary | ICD-10-CM

## 2015-05-01 DIAGNOSIS — N8110 Cystocele, unspecified: Secondary | ICD-10-CM | POA: Diagnosis not present

## 2015-05-01 DIAGNOSIS — N814 Uterovaginal prolapse, unspecified: Secondary | ICD-10-CM | POA: Diagnosis not present

## 2015-05-01 DIAGNOSIS — IMO0002 Reserved for concepts with insufficient information to code with codable children: Secondary | ICD-10-CM | POA: Insufficient documentation

## 2015-05-01 NOTE — Progress Notes (Signed)
GYN ENCOUNTER NOTE  Subjective:       [name] is a 70 y.o. [phone] female is here for gynecologic evaluation of the following issues:  1. Pelvic organ prolapse  The patient is a 70 year old married white female para 2002, menopausal, on no hormone replacement therapy, who presents for evaluation of probable pelvic organ prolapse. The patient has noted a bulge or mass at the opening of the vagina over the past 6-9 months. Comorbidities complicating this problem included history of breast cancer (not an ERT candidate) and 30-pack-year smoking history with subsequent COPD exacerbating prolapse.  GU symptoms: Urinary frequency is approximately 8-10 times a day. She does not experience nocturia. She has no experience of stress incontinence. She does notice occasional urge to go to the bathroom. Patient feels that she completely empties her bladder during voids.  GI symptoms: Bowel movements occur daily. Patient does not have chronic constipation. She does not have to splint with bowel movements.  Patient is not sexually active..     Gynecologic History No LMP recorded. Patient is postmenopausal. History of breast cancer. No history of ERT use No history of abnormal Pap smears.  Obstetric History OB History  Gravida Para Term Preterm AB SAB TAB Ectopic Multiple Living  '2 2 2       2    '$ # Outcome Date GA Lbr Len/2nd Weight Sex Delivery Anes PTL Lv  2 Term 1974   7 lb 14.4 oz (3.583 kg) F Vag-Spont   Y  1 Term 1972   9 lb 1.6 oz (4.128 kg) F Vag-Spont   Y      Past Medical History  Diagnosis Date  . Asthma   . Diabetes mellitus type II   . GERD (gastroesophageal reflux disease)   . Gastroparesis   . Diverticulosis   . Anxiety   . Depression   . History of breast cancer   . History of adenomatous polyp of colon   . Hyperlipidemia   . COPD (chronic obstructive pulmonary disease) ([place])   . Pulmonary nodule     multiple  . Osteoarthritis   . Carcinoid tumor of  lung 1991  . Mild cognitive impairment   . Renal cell carcinoma   . Breast cancer [facility])     h/o- radiation    Past Surgical History  Procedure Laterality Date  . Cholecystectomy  1975  . Tonsillectomy and adenoidectomy  1953  . Lung surgery  1994    carcinoid removal  . Laparoscopic nephrectomy  01/2009    right  . Breast lumpectomy  1998    right breast    Current Outpatient Prescriptions on File Prior to Visit  Medication Sig Dispense Refill  . albuterol (PROAIR HFA) 108 (90 BASE) MCG/ACT inhaler Inhale 2 puffs into the lungs every 6 (six) hours as needed for wheezing or shortness of breath. 18 g 0  . Cholecalciferol (VITAMIN D3) 5000 UNITS CAPS Take 1 capsule by mouth every morning.    . citalopram (CELEXA) 20 MG tablet TAKE 1 TABLET DAILY 90 tablet 3  . ferrous sulfate 325 (65 FE) MG tablet Take by mouth.    . fexofenadine (ALLEGRA) 180 MG tablet Take 180 mg by mouth daily as needed (nasal drainage).    . fluticasone (FLONASE) 50 MCG/ACT nasal spray USE 2 SPRAYS NASALLY DAILY 48 g 11  . glucose blood (FREESTYLE TEST STRIPS) test strip Use as instructed 100 each 12  . L-Methylfolate-Algae-B12-B6 (METANX) 3-90.314-2-35 MG CAPS Take 180 mg by  mouth 2 (two) times daily.    Marland Kitchen LYRICA 75 MG capsule Take 75 mg by mouth 3 (three) times daily.     . metformin (FORTAMET) 1000 MG (OSM) 24 hr tablet TAKE 1 TABLET TWICE A DAY 180 tablet 0  . mometasone-formoterol (DULERA) 200-5 MCG/ACT AERO Take 2 puffs first thing in am and then another 2 puffs about 12 hours later. 1 Inhaler 0  . Multiple Vitamin (MULTIVITAMIN) tablet Take 1 tablet by mouth every morning.     . Omega-3 Fatty Acids (FISH OIL) 1200 MG CAPS Take 1 capsule by mouth every morning. (place in the freezer)    . pantoprazole (PROTONIX) 40 MG tablet Take by mouth.    . polyethylene glycol powder (GLYCOLAX/MIRALAX) powder Take 17 grams (1 capful) every morning and 9 grams (1/2 capful) every evening 850 g 10  . SPIRIVA HANDIHALER 18  MCG inhalation capsule INHALE THE CONTENTS OF ONE CAPSULE VIA HANDIHALER     DAILY 90 capsule 3   No current facility-administered medications on file prior to visit.    Allergies  Allergen Reactions  . Budesonide-Formoterol Fumarate Other (See Comments)    REACTION: coughed worse  . Lipitor [Atorvastatin] Other (See Comments)    Nosebleeds  . Penicillins Rash    REACTION: rash    Social History   Social History  . Marital Status: Married    Spouse Name: N/A  . Number of Children: 2  . Years of Education: N/A   Occupational History  . retired Science writer    Social History Main Topics  . Smoking status: Former Smoker -- 1.00 packs/day for 30 years    Types: Cigarettes    Quit date: 03/08/1993  . Smokeless tobacco: Never Used  . Alcohol Use: No  . Drug Use: No  . Sexual Activity: Not Currently    Birth Control/ Protection: Post-menopausal   Other Topics Concern  . Not on file   Social History Narrative   No living will   Requests husband as health care POA   Would accept attempts at resuscitation   No tube feeds if cognitively unaware             Family History  Problem Relation Age of Onset  . Colon cancer Father   . Lung cancer Father   . Stroke Mother   . Breast cancer Neg Hx   . Ovarian cancer Neg Hx   . Diabetes Neg Hx     The following portions of the patient's history were reviewed and updated as appropriate: allergies, current medications, past family history, past medical history, past social history, past surgical history and problem list.  Review of Systems Review of Systems - General ROS: negative for - chills, fatigue, fever, hot flashes, malaise or night sweats Hematological and Lymphatic ROS: negative for - bleeding problems or swollen lymph nodes Gastrointestinal ROS: negative for - abdominal pain, blood in stools, change in bowel habits and nausea/vomiting Musculoskeletal ROS: negative for - joint pain, muscle pain or muscular  weakness Genito-Urinary ROS: negative for - change in menstrual cycle, dysmenorrhea, dyspareunia, dysuria, genital discharge, genital ulcers, hematuria, incontinence, irregular/heavy menses, nocturia or pelvic pain  Objective:   BP 133/70 mmHg  Pulse 74  Ht '5\' 4"'$  (1.626 m)  Wt 130 lb 1.6 oz (59.013 kg)  BMI 22.32 kg/m2 CONSTITUTIONAL: Well-developed, well-nourished female in no acute distress.  HENT:  Normocephalic, atraumatic.  NECK: Normal range of motion, supple, no masses.  Normal thyroid.  SKIN: Skin is warm  and dry. No rash noted. Not diaphoretic. No erythema. No pallor. [place]: Alert and oriented to person, place, and time. PSYCHIATRIC: Normal mood and affect. Normal behavior. Normal judgment and thought content. CARDIOVASCULAR:Not Examined RESPIRATORY: Not Examined BREASTS: Not Examined ABDOMEN: Soft, non distended; Non tender.  No Organomegaly. PELVIC:  External Genitalia: Normal  BUS: Normal  Vagina: Atrophy present, moderate; third degree cystocele with Valsalva; moderate rectocele; uterine prolapse to the introitus  Cervix: Normal; parous, no cervical motion tenderness, mobile  Uterus: Normal size, shape,consistency, mobile  Adnexa: Normal  RV: Normal external exam; normal sphincter tone; widely splayed levators confirming rectocele  Bladder: Nontender MUSCULOSKELETAL: Normal range of motion. No tenderness.  No cyanosis, clubbing, or edema.     Assessment:   1. Third-degree cystocele, asymptomatic 2. Moderate rectocele, astigmatic 3. Uterine prolapse to the introitus 4. History of breast cancer, not an ERT candidate 5. Vaginal atrophy, moderate 6. COPD secondary to long tobacco use exacerbating prolapse symptoms  Plan:   1. Defects are explained to the patient; literature is given 2. Patient is to return in 2 weeks for pessary trial. She is to bring a family member long in order to help answer questions and remember information due to the patient's memory  problems 3. Should pessary dropping unsuccessful, consideration will be given to [facility] with anterior/posterior colporrhaphy is versus just living with the defects (which may not help quality of life).  Brayton Mars, MD  Note: This dictation was prepared with Dragon dictation along with smaller phrase technology. Any transcriptional errors that result from this process are unintentional.

## 2015-05-01 NOTE — Patient Instructions (Signed)
1. Pelvic organ prolapse includes:  Cystocele (bladder prolapse)  Rectocele (rectal prolapse)  Uterine prolapse 2. Treatment for this condition includes medical or surgical therapy. 3. I recommend the patient come back in 2 weeks with a family member. At that visit we will do a pessary trial (medical management of prolapse.). 4. If this medical management is unsuccessful, the patient may either live with the prolapse issues (with possible decreased quality of living), or she may choose to have surgery in the form of transvaginal hysterectomy with cystocele repair and rectocele repair.    Pelvic Organ Prolapse Pelvic organ prolapse is the stretching, bulging, or dropping of pelvic organs into an abnormal position. It happens when the muscles and tissues that surround and support pelvic structures are stretched or weak. Pelvic organ prolapse can involve:  Vagina (vaginal prolapse).  Uterus (uterine prolapse).  Bladder (cystocele).  Rectum (rectocele).  Intestines (enterocele). When organs other than the vagina are involved, they often bulge into the vagina or protrude from the vagina, depending on how severe the prolapse is. CAUSES Causes of this condition include:  Pregnancy, labor, and childbirth.  Long-lasting (chronic) cough.  Chronic constipation.  Obesity.  Past pelvic surgery.  Aging. During and after menopause, a decreased production of the hormone estrogen can weaken pelvic ligaments and muscles.  Consistently lifting more than 50 lb (23 kg).  Buildup of fluid in the abdomen due to certain diseases and other conditions. SYMPTOMS Symptoms of this condition include:  Loss of bladder control when you cough, sneeze, strain, and exercise (stress incontinence). This may be worse immediately following childbirth, and it may gradually improve over time.  Feeling pressure in your pelvis or vagina. This pressure may increase when you cough or when you are having a bowel  movement.  A bulge that protrudes from the opening of your vagina or against your vaginal wall. If your uterus protrudes through the opening of your vagina and rubs against your clothing, you may also experience soreness, ulcers, infection, pain, and bleeding.  Increased effort to have a bowel movement or urinate.  Pain in your low back.  Pain, discomfort, or disinterest in sexual intercourse.  Repeated bladder infections (urinary tract infections).  Difficulty inserting or inability to insert a tampon or applicator. In some people, this condition does not cause any symptoms. DIAGNOSIS Your health care provider may perform an internal and external vaginal and rectal exam. During the exam, you may be asked to cough and strain while you are lying down, sitting, and standing up. Your health care provider will determine if other tests are required, such as bladder function tests. TREATMENT In most cases, this condition needs to be treated only if it produces symptoms. No treatment is guaranteed to correct the prolapse or relieve the symptoms completely. Treatment may include:  Lifestyle changes, such as:  Avoiding drinking beverages that contain caffeine.  Increasing your intake of high-fiber foods. This can help to decrease constipation and straining during bowel movements.  Emptying your bladder at scheduled times (bladder training therapy). This can help to reduce or avoid urinary incontinence.  Losing weight if you are overweight or obese.  Estrogen. Estrogen may help mild prolapse by increasing the strength and tone of pelvic floor muscles.  Kegel exercises. These may help mild cases of prolapse by strengthening and tightening the muscles of the pelvic floor.  Pessary insertion. A pessary is a soft, flexible device that is placed into your vagina by your health care provider to help support the  vaginal walls and keep pelvic organs in place.  Surgery. This is often the only form of  treatment for severe prolapse. Different types of surgeries are available. HOME CARE INSTRUCTIONS  Wear a sanitary pad or absorbent product if you have urinary incontinence.  Avoid heavy lifting and straining with exercise and work. Do not hold your breath when you perform mild to moderate lifting and exercise activities. Limit your activities as directed by your health care provider.  Take medicines only as directed by your health care provider.  Perform Kegel exercises as directed by your health care provider.  If you have a pessary, take care of it as directed by your health care provider. SEEK MEDICAL CARE IF:  Your symptoms interfere with your daily activities or sex life.  You need medicine to help with the discomfort.  You notice bleeding from the vagina that is not related to your period.  You have a fever.  You have pain or bleeding when you urinate.  You have bleeding when you have a bowel movement.  You lose urine when you have sex.  You have chronic constipation.  You have a pessary that falls out.  You have vaginal discharge that has a bad smell.  You have low abdominal pain or cramping that is unusual for you.   This information is not intended to replace advice given to you by your health care provider. Make sure you discuss any questions you have with your health care provider.   Document Released: 09/19/2013 Document Reviewed: 09/19/2013 Elsevier Interactive Patient Education [employer].

## 2015-05-02 ENCOUNTER — Ambulatory Visit (INDEPENDENT_AMBULATORY_CARE_PROVIDER_SITE_OTHER): Payer: Medicare Other | Admitting: Internal Medicine

## 2015-05-02 ENCOUNTER — Encounter: Payer: Self-pay | Admitting: Internal Medicine

## 2015-05-02 VITALS — BP 124/70 | HR 82 | Temp 97.3°F | Ht 60.0 in | Wt 129.5 lb

## 2015-05-02 DIAGNOSIS — E1140 Type 2 diabetes mellitus with diabetic neuropathy, unspecified: Secondary | ICD-10-CM | POA: Diagnosis not present

## 2015-05-02 DIAGNOSIS — G3184 Mild cognitive impairment, so stated: Secondary | ICD-10-CM | POA: Diagnosis not present

## 2015-05-02 DIAGNOSIS — Z0000 Encounter for general adult medical examination without abnormal findings: Secondary | ICD-10-CM

## 2015-05-02 DIAGNOSIS — F39 Unspecified mood [affective] disorder: Secondary | ICD-10-CM

## 2015-05-02 DIAGNOSIS — Z23 Encounter for immunization: Secondary | ICD-10-CM

## 2015-05-02 DIAGNOSIS — J449 Chronic obstructive pulmonary disease, unspecified: Secondary | ICD-10-CM | POA: Diagnosis not present

## 2015-05-02 DIAGNOSIS — Z7189 Other specified counseling: Secondary | ICD-10-CM

## 2015-05-02 LAB — CBC WITH DIFFERENTIAL/PLATELET
BASOS PCT: 0.6 % (ref 0.0–3.0)
Basophils Absolute: 0.1 10*3/uL (ref 0.0–0.1)
EOS ABS: 0.1 10*3/uL (ref 0.0–0.7)
Eosinophils Relative: 1.2 % (ref 0.0–5.0)
HEMATOCRIT: 39.2 % (ref 36.0–46.0)
Hemoglobin: 12.9 g/dL (ref 12.0–15.0)
LYMPHS ABS: 3.1 10*3/uL (ref 0.7–4.0)
Lymphocytes Relative: 35 % (ref 12.0–46.0)
MCHC: 32.9 g/dL (ref 30.0–36.0)
MCV: 91.7 fl (ref 78.0–100.0)
MONO ABS: 0.5 10*3/uL (ref 0.1–1.0)
Monocytes Relative: 6.1 % (ref 3.0–12.0)
NEUTROS ABS: 5.1 10*3/uL (ref 1.4–7.7)
NEUTROS PCT: 57.1 % (ref 43.0–77.0)
PLATELETS: 216 10*3/uL (ref 150.0–400.0)
RBC: 4.27 Mil/uL (ref 3.87–5.11)
RDW: 14.7 % (ref 11.5–15.5)
WBC: 9 10*3/uL (ref 4.0–10.5)

## 2015-05-02 LAB — HEMOGLOBIN A1C: HEMOGLOBIN A1C: 5.9 % (ref 4.6–6.5)

## 2015-05-02 LAB — COMPREHENSIVE METABOLIC PANEL
ALT: 19 U/L (ref 0–35)
AST: 17 U/L (ref 0–37)
Albumin: 4.1 g/dL (ref 3.5–5.2)
Alkaline Phosphatase: 45 U/L (ref 39–117)
BILIRUBIN TOTAL: 0.5 mg/dL (ref 0.2–1.2)
BUN: 18 mg/dL (ref 6–23)
CHLORIDE: 106 meq/L (ref 96–112)
CO2: 26 meq/L (ref 19–32)
CREATININE: 1.29 mg/dL — AB (ref 0.40–1.20)
Calcium: 9.3 mg/dL (ref 8.4–10.5)
GFR: 43.21 mL/min — ABNORMAL LOW (ref >60.00)
Glucose, Bld: 97 mg/dL (ref 70–99)
Potassium: 4.5 mEq/L (ref 3.5–5.1)
SODIUM: 139 meq/L (ref 135–145)
Total Protein: 6.8 g/dL (ref 6.0–8.3)

## 2015-05-02 LAB — T4, FREE: FREE T4: 0.93 ng/dL (ref 0.60–1.60)

## 2015-05-02 LAB — MICROALBUMIN / CREATININE URINE RATIO
CREATININE, U: 35.9 mg/dL
Microalb Creat Ratio: 1.9 mg/g (ref 0.0–30.0)

## 2015-05-02 LAB — LIPID PANEL
CHOLESTEROL: 185 mg/dL (ref 0–200)
HDL: 58.5 mg/dL (ref >39.00)
LDL CALC: 99 mg/dL (ref 0–99)
NonHDL: 126.76
Total CHOL/HDL Ratio: 3
Triglycerides: 141 mg/dL (ref 0.0–149.0)
VLDL: 28.2 mg/dL (ref 0.0–40.0)

## 2015-05-02 LAB — HM DIABETES FOOT EXAM

## 2015-05-02 MED ORDER — MONTELUKAST SODIUM 10 MG PO TABS: 10.0000 mg | ORAL_TABLET | Freq: Every day | ORAL | Status: DC

## 2015-05-02 NOTE — Assessment & Plan Note (Signed)
Daughter thinks they may have done forms She will check

## 2015-05-02 NOTE — Progress Notes (Signed)
Subjective:    Patient ID: [name], female    DOB: 04/23/1945, 70 y.o.   MRN: [id number]  HPI Here with daughter [name] for Medicare wellness and follow up on chronic medical conditions Reviewed form and advanced directives Reviewed other doctors No tobacco or alcohol Tries to do exercise Several falls---with injury No depression or anhedonia Independent with instrumental ADLs Vision is good. Hearing aides-help her some but thinks it is worse (will go for recheck)  Husband recently diagnosed with ulcerative colitis She is now cooking special for him Energy levels have been very good Positive experience with PT Joe  Memory seems stable Off donepezil without any decline Reviewed recent [facility] geriatrics note  Now having pollen symptoms Congestion, etc Regular cough--wet now Afraid of COPD worsening Frontal headache from congestion  Checks sugars once a week or so They are okay--she doesn't remember numbers No hypoglycemic reactions Numbness in feet but rare pain only. No ulcers  Saw gyn yesterday for urinary problems Will be going back for pessary  Current Outpatient Prescriptions on File Prior to Visit  Medication Sig Dispense Refill  . albuterol (PROAIR HFA) 108 (90 BASE) MCG/ACT inhaler Inhale 2 puffs into the lungs every 6 (six) hours as needed for wheezing or shortness of breath. 18 g 0  . alendronate (FOSAMAX) 70 MG tablet TK 1 T PO Q 7 DAYS  11  . Cholecalciferol (VITAMIN D3) 5000 UNITS CAPS Take 1 capsule by mouth every morning.    . citalopram (CELEXA) 20 MG tablet TAKE 1 TABLET DAILY 90 tablet 3  . fexofenadine (ALLEGRA) 180 MG tablet Take 180 mg by mouth daily as needed (nasal drainage).    . fluticasone (FLONASE) 50 MCG/ACT nasal spray USE 2 SPRAYS NASALLY DAILY 48 g 11  . glucose blood (FREESTYLE TEST STRIPS) test strip Use as instructed 100 each 12  . L-Methylfolate-Algae-B12-B6 (METANX) 3-90.314-2-35 MG CAPS Take 180 mg by mouth 2 (two) times  daily.    Marland Kitchen LYRICA 75 MG capsule Take 75 mg by mouth 3 (three) times daily.     . metformin (FORTAMET) 1000 MG (OSM) 24 hr tablet TAKE 1 TABLET TWICE A DAY 180 tablet 0  . mometasone-formoterol (DULERA) 200-5 MCG/ACT AERO Take 2 puffs first thing in am and then another 2 puffs about 12 hours later. 1 Inhaler 0  . Multiple Vitamin (MULTIVITAMIN) tablet Take 1 tablet by mouth every morning.     . Omega-3 Fatty Acids (FISH OIL) 1200 MG CAPS Take 1 capsule by mouth every morning. (place in the freezer)    . polyethylene glycol powder (GLYCOLAX/MIRALAX) powder Take 17 grams (1 capful) every morning and 9 grams (1/2 capful) every evening 850 g 10  . pravastatin (PRAVACHOL) 40 MG tablet     . SPIRIVA HANDIHALER 18 MCG inhalation capsule INHALE THE CONTENTS OF ONE CAPSULE VIA HANDIHALER     DAILY 90 capsule 3   No current facility-administered medications on file prior to visit.    Allergies  Allergen Reactions  . Budesonide-Formoterol Fumarate Other (See Comments)    REACTION: coughed worse  . Lipitor [Atorvastatin] Other (See Comments)    Nosebleeds  . Penicillins Rash    REACTION: rash    Past Medical History  Diagnosis Date  . Asthma   . Diabetes mellitus type II   . GERD (gastroesophageal reflux disease)   . Gastroparesis   . Diverticulosis   . Anxiety   . Depression   . History of breast cancer   .  History of adenomatous polyp of colon   . Hyperlipidemia   . COPD (chronic obstructive pulmonary disease) ([place])   . Pulmonary nodule     multiple  . Osteoarthritis   . Carcinoid tumor of lung 1991  . Mild cognitive impairment   . Renal cell carcinoma   . Breast cancer [facility])     h/o- radiation    Past Surgical History  Procedure Laterality Date  . Cholecystectomy  1975  . Tonsillectomy and adenoidectomy  1953  . Lung surgery  1994    carcinoid removal  . Laparoscopic nephrectomy  01/2009    right  . Breast lumpectomy  1998    right breast    Family History  Problem  Relation Age of Onset  . Colon cancer Father   . Lung cancer Father   . Stroke Mother   . Breast cancer Neg Hx   . Ovarian cancer Neg Hx   . Diabetes Neg Hx     Social History   Social History  . Marital Status: Married    Spouse Name: N/A  . Number of Children: 2  . Years of Education: N/A   Occupational History  . retired Science writer    Social History Main Topics  . Smoking status: Former Smoker -- 1.00 packs/day for 30 years    Types: Cigarettes    Quit date: 03/08/1993  . Smokeless tobacco: Never Used  . Alcohol Use: No  . Drug Use: No  . Sexual Activity: Not Currently    Birth Control/ Protection: Post-menopausal   Other Topics Concern  . Not on file   Social History Narrative   No living will   Requests husband as health care POA   Would accept attempts at resuscitation   No tube feeds if cognitively unaware            Review of Systems Appetite is okay Weight stable Wears seat belt Full dentures---does notice dry mouth No back pain. Some trouble with left shoulder chronically but no other sig joint issues Bowels are normal. No blood No rash or suspicious skin lesions Sleeps well    Objective:   Physical Exam  Constitutional: She appears well-developed and well-nourished. No distress.  HENT:  Mouth/Throat: Oropharynx is clear and moist. No oropharyngeal exudate.  Neck: Normal range of motion. Neck supple. No thyromegaly present.  Cardiovascular: Normal rate, regular rhythm, normal heart sounds and intact distal pulses.  Exam reveals no gallop.   No murmur heard. Pulmonary/Chest: Effort normal. No respiratory distress. She has no wheezes. She has no rales.  ?slightly decreased breath sounds but clear Occasional cough  Abdominal: Soft. There is no tenderness.  Musculoskeletal: She exhibits no edema or tenderness.  Lymphadenopathy:    She has no cervical adenopathy.  Neurological:  Mild decreased sensation in feet  Skin: No rash noted. No erythema.    Psychiatric: She has a normal mood and affect. Her behavior is normal.          Assessment & Plan:

## 2015-05-02 NOTE — Assessment & Plan Note (Addendum)
Seems to still have good control lyrica effective for her neuropathy

## 2015-05-02 NOTE — Assessment & Plan Note (Signed)
VS early dementia Off donepezil with no decline Will just watch

## 2015-05-02 NOTE — Assessment & Plan Note (Signed)
Generally doing okay but seems to have allergy trigger Will start montelukast

## 2015-05-02 NOTE — Progress Notes (Signed)
Pre visit review using our clinic review tool, if applicable. No additional management support is needed unless otherwise documented below in the visit note. 

## 2015-05-02 NOTE — Assessment & Plan Note (Signed)
Mood is better ?related to early cognitive problems and now more stable

## 2015-05-02 NOTE — Assessment & Plan Note (Signed)
I have personally reviewed the Medicare Annual Wellness questionnaire and have noted 1. The patient's medical and social history 2. Their use of alcohol, tobacco or illicit drugs 3. Their current medications and supplements 4. The patient's functional ability including ADL's, fall risks, home safety risks and hearing or visual             impairment. 5. Diet and physical activities 6. Evidence for depression or mood disorders  The patients weight, height, BMI and visual acuity have been recorded in the chart I have made referrals, counseling and provided education to the patient based review of the above and I have provided the pt with a written personalized care plan for preventive services.  I have provided you with a copy of your personalized plan for preventive services. Please take the time to review along with your updated medication list.  Needs prevnar Colon due 2/18 Gets regular mammograms due to past breast cancer

## 2015-05-02 NOTE — Addendum Note (Signed)
Addended by: Despina Hidden on: 05/02/2015 10:42 AM   Modules accepted: Orders

## 2015-05-07 ENCOUNTER — Other Ambulatory Visit: Payer: Self-pay | Admitting: Cardiothoracic Surgery

## 2015-05-07 DIAGNOSIS — M7542 Impingement syndrome of left shoulder: Secondary | ICD-10-CM | POA: Diagnosis not present

## 2015-05-07 DIAGNOSIS — R911 Solitary pulmonary nodule: Secondary | ICD-10-CM

## 2015-05-09 ENCOUNTER — Other Ambulatory Visit: Payer: Self-pay | Admitting: Cardiothoracic Surgery

## 2015-05-09 DIAGNOSIS — J984 Other disorders of lung: Secondary | ICD-10-CM

## 2015-05-13 ENCOUNTER — Other Ambulatory Visit: Payer: Self-pay | Admitting: Specialist

## 2015-05-13 DIAGNOSIS — M7542 Impingement syndrome of left shoulder: Secondary | ICD-10-CM

## 2015-05-14 DIAGNOSIS — J449 Chronic obstructive pulmonary disease, unspecified: Secondary | ICD-10-CM | POA: Diagnosis not present

## 2015-05-14 DIAGNOSIS — R269 Unspecified abnormalities of gait and mobility: Secondary | ICD-10-CM | POA: Diagnosis not present

## 2015-05-14 DIAGNOSIS — M6281 Muscle weakness (generalized): Secondary | ICD-10-CM | POA: Diagnosis not present

## 2015-05-15 ENCOUNTER — Encounter: Payer: Self-pay | Admitting: Obstetrics and Gynecology

## 2015-05-15 ENCOUNTER — Ambulatory Visit (INDEPENDENT_AMBULATORY_CARE_PROVIDER_SITE_OTHER): Payer: Medicare Other | Admitting: Obstetrics and Gynecology

## 2015-05-15 VITALS — BP 133/70 | HR 74 | Ht 60.0 in | Wt 128.3 lb

## 2015-05-15 DIAGNOSIS — N8110 Cystocele, unspecified: Secondary | ICD-10-CM | POA: Diagnosis not present

## 2015-05-15 DIAGNOSIS — N816 Rectocele: Secondary | ICD-10-CM

## 2015-05-15 DIAGNOSIS — IMO0002 Reserved for concepts with insufficient information to code with codable children: Secondary | ICD-10-CM

## 2015-05-15 DIAGNOSIS — N814 Uterovaginal prolapse, unspecified: Secondary | ICD-10-CM

## 2015-05-15 DIAGNOSIS — N952 Postmenopausal atrophic vaginitis: Secondary | ICD-10-CM | POA: Diagnosis not present

## 2015-05-16 NOTE — Progress Notes (Signed)
GYN ENCOUNTER NOTE  Subjective:       [name] is a 70 y.o. [phone] female is here for gynecologic evaluation of the following issues:  1. Pelvic organ prolapse-pessary trial.     Patient has third-degree cystocele, asymptomatic; moderate rectocele asymptomatic; uterine prolapse to the introitus. Comorbidities complicating this problem included history of breast cancer (not an ERT candidate) and 30-pack-year smoking history with subsequent COPD exacerbating prolapse.   Gynecologic History No LMP recorded. Patient is postmenopausal.  Obstetric History OB History  Gravida Para Term Preterm AB SAB TAB Ectopic Multiple Living  '2 2 2       2    '$ # Outcome Date GA Lbr Len/2nd Weight Sex Delivery Anes PTL Lv  2 Term 1974   7 lb 14.4 oz (3.583 kg) F Vag-Spont   Y  1 Term 1972   9 lb 1.6 oz (4.128 kg) F Vag-Spont   Y      Past Medical History  Diagnosis Date  . Asthma   . Diabetes mellitus type II   . GERD (gastroesophageal reflux disease)   . Gastroparesis   . Diverticulosis   . Anxiety   . Depression   . History of breast cancer   . History of adenomatous polyp of colon   . Hyperlipidemia   . COPD (chronic obstructive pulmonary disease) ([place])   . Pulmonary nodule     multiple  . Osteoarthritis   . Carcinoid tumor of lung 1991  . Mild cognitive impairment   . Renal cell carcinoma   . Breast cancer [facility])     h/o- radiation    Past Surgical History  Procedure Laterality Date  . Cholecystectomy  1975  . Tonsillectomy and adenoidectomy  1953  . Lung surgery  1994    carcinoid removal  . Laparoscopic nephrectomy  01/2009    right  . Breast lumpectomy  1998    right breast    Current Outpatient Prescriptions on File Prior to Visit  Medication Sig Dispense Refill  . alendronate (FOSAMAX) 70 MG tablet TK 1 T PO Q 7 DAYS  11  . Cholecalciferol (VITAMIN D3) 5000 UNITS CAPS Take 1 capsule by mouth every morning.    . citalopram (CELEXA) 20 MG tablet TAKE 1 TABLET  DAILY 90 tablet 3  . fexofenadine (ALLEGRA) 180 MG tablet Take 180 mg by mouth daily as needed (nasal drainage).    . fluticasone (FLONASE) 50 MCG/ACT nasal spray USE 2 SPRAYS NASALLY DAILY 48 g 11  . glucose blood (FREESTYLE TEST STRIPS) test strip Use as instructed 100 each 12  . L-Methylfolate-Algae-B12-B6 (METANX) 3-90.314-2-35 MG CAPS Take 180 mg by mouth 2 (two) times daily.    Marland Kitchen LYRICA 75 MG capsule Take 75 mg by mouth 3 (three) times daily.     . metformin (FORTAMET) 1000 MG (OSM) 24 hr tablet TAKE 1 TABLET TWICE A DAY 180 tablet 0  . mometasone-formoterol (DULERA) 200-5 MCG/ACT AERO Take 2 puffs first thing in am and then another 2 puffs about 12 hours later. 1 Inhaler 0  . montelukast (SINGULAIR) 10 MG tablet Take 1 tablet (10 mg total) by mouth at bedtime. 90 tablet 3  . Multiple Vitamin (MULTIVITAMIN) tablet Take 1 tablet by mouth every morning.     . Omega-3 Fatty Acids (FISH OIL) 1200 MG CAPS Take 1 capsule by mouth every morning. (place in the freezer)    . polyethylene glycol powder (GLYCOLAX/MIRALAX) powder Take 17 grams (1 capful) every morning and  9 grams (1/2 capful) every evening 850 g 10  . pravastatin (PRAVACHOL) 40 MG tablet     . SPIRIVA HANDIHALER 18 MCG inhalation capsule INHALE THE CONTENTS OF ONE CAPSULE VIA HANDIHALER     DAILY 90 capsule 3   No current facility-administered medications on file prior to visit.    Allergies  Allergen Reactions  . Budesonide-Formoterol Fumarate Other (See Comments)    REACTION: coughed worse  . Lipitor [Atorvastatin] Other (See Comments)    Nosebleeds  . Penicillins Rash    REACTION: rash    Social History   Social History  . Marital Status: Married    Spouse Name: N/A  . Number of Children: 2  . Years of Education: N/A   Occupational History  . retired Science writer    Social History Main Topics  . Smoking status: Former Smoker -- 1.00 packs/day for 30 years    Types: Cigarettes    Quit date: 03/08/1993  . Smokeless  tobacco: Never Used  . Alcohol Use: No  . Drug Use: No  . Sexual Activity: Not Currently    Birth Control/ Protection: Post-menopausal   Other Topics Concern  . Not on file   Social History Narrative   No living will   Requests husband as health care POA   Would accept attempts at resuscitation   No tube feeds if cognitively unaware             Family History  Problem Relation Age of Onset  . Colon cancer Father   . Lung cancer Father   . Stroke Mother   . Breast cancer Neg Hx   . Ovarian cancer Neg Hx   . Diabetes Neg Hx     The following portions of the patient's history were reviewed and updated as appropriate: allergies, current medications, past family history, past medical history, past social history, past surgical history and problem list.  Review of Systems Review of Systems - General ROS: negative for - chills, fatigue, fever, hot flashes, malaise or night sweats Hematological and Lymphatic ROS: negative for - bleeding problems or swollen lymph nodes Gastrointestinal ROS: negative for - abdominal pain, blood in stools, change in bowel habits and nausea/vomiting Musculoskeletal ROS: negative for - joint pain, muscle pain or muscular weakness Genito-Urinary ROS: negative for - change in menstrual cycle, dysmenorrhea, dyspareunia, dysuria, genital discharge, genital ulcers, hematuria, incontinence, irregular/heavy menses, nocturia or pelvic painjj  Objective:   BP 133/70 mmHg  Pulse 74  Ht 5' (1.524 m)  Wt 128 lb 4.8 oz (58.196 kg)  BMI 25.06 kg/m2 CONSTITUTIONAL: Well-developed, well-nourished female in no acute distress.  HENT:  Normocephalic, atraumatic.  NECK: Normal range of motion, supple, no masses.  Normal thyroid.  SKIN: Skin is warm and dry. No rash noted. Not diaphoretic. No erythema. No pallor. [place]: Alert and oriented to person, place, and time. PSYCHIATRIC: Normal mood and affect. Normal behavior. Normal judgment and thought  content. CARDIOVASCULAR:Not Examined RESPIRATORY: Not Examined BREASTS: Not Examined ABDOMEN: Soft, non distended; Non tender.  No Organomegaly. PELVIC: PELVIC: External Genitalia: Normal BUS: Normal Vagina: Atrophy present, moderate; third degree cystocele with Valsalva; moderate rectocele; uterine prolapse to the introitus Cervix: Normal; parous, no cervical motion tenderness, mobile Uterus: Normal size, shape,consistency, mobile Adnexa: Normal RV: Normal external exam; normal sphincter tone; widely splayed levators confirming rectocele Bladder: Nontender   PROCEDURE: Pessary trial  #5 ring with support-to large  #4 ring with support-acceptable fit  72 mm gelhorn  Patient desires #4 ring  with support     Assessment:     1. Third-degree cystocele, asymptomatic 2. Moderate rectocele, astigmatic 3. Uterine prolapse to the introitus 4. History of breast cancer, not an ERT candidate 5. Vaginal atrophy, moderate 6. COPD secondary to long tobacco use exacerbating prolapse symptoms 7. Successful pessary fitting with #4 ring with support diaphragm  Plan:   1. #4 ring with support diaphragm-ordered 2. Patient will be contacted 5 phone when pessary is available  Brayton Mars, MD  Note: This dictation was prepared with Dragon dictation along with smaller phrase technology. Any transcriptional errors that result from this process are unintentional.

## 2015-05-16 NOTE — Patient Instructions (Signed)
1. #4 ring with support diaphragm is ordered 2. Patient will be contacted when pessary arrives.

## 2015-05-21 ENCOUNTER — Other Ambulatory Visit: Payer: Medicare Other

## 2015-05-21 ENCOUNTER — Ambulatory Visit: Payer: Medicare Other | Admitting: Cardiothoracic Surgery

## 2015-05-22 DIAGNOSIS — H2511 Age-related nuclear cataract, right eye: Secondary | ICD-10-CM | POA: Diagnosis not present

## 2015-05-28 ENCOUNTER — Ambulatory Visit: Payer: Medicare Other

## 2015-05-28 DIAGNOSIS — R269 Unspecified abnormalities of gait and mobility: Secondary | ICD-10-CM | POA: Diagnosis not present

## 2015-05-28 DIAGNOSIS — J449 Chronic obstructive pulmonary disease, unspecified: Secondary | ICD-10-CM | POA: Diagnosis not present

## 2015-05-28 DIAGNOSIS — M6281 Muscle weakness (generalized): Secondary | ICD-10-CM | POA: Diagnosis not present

## 2015-05-29 ENCOUNTER — Ambulatory Visit (INDEPENDENT_AMBULATORY_CARE_PROVIDER_SITE_OTHER): Payer: Medicare Other | Admitting: Obstetrics and Gynecology

## 2015-05-29 ENCOUNTER — Encounter: Payer: Self-pay | Admitting: Obstetrics and Gynecology

## 2015-05-29 VITALS — BP 130/69 | HR 99 | Ht 60.0 in | Wt 128.8 lb

## 2015-05-29 DIAGNOSIS — N816 Rectocele: Secondary | ICD-10-CM | POA: Diagnosis not present

## 2015-05-29 DIAGNOSIS — N8110 Cystocele, unspecified: Secondary | ICD-10-CM

## 2015-05-29 DIAGNOSIS — N814 Uterovaginal prolapse, unspecified: Secondary | ICD-10-CM | POA: Diagnosis not present

## 2015-05-29 DIAGNOSIS — IMO0002 Reserved for concepts with insufficient information to code with codable children: Secondary | ICD-10-CM

## 2015-05-29 DIAGNOSIS — N952 Postmenopausal atrophic vaginitis: Secondary | ICD-10-CM | POA: Diagnosis not present

## 2015-05-29 NOTE — Progress Notes (Signed)
Chief complaint: 1. Pessary insertion  Patient presents today for #4 ring with support pessary for pelvic organ prolapse.  OBJECTIVE:. BP 130/69 mmHg  Pulse 99  Ht 5' (1.524 m)  Wt 128 lb 12.8 oz (58.423 kg)  BMI 25.15 kg/m2 PELVIC: External Genitalia: Normal BUS: Normal Vagina: Atrophy present, moderate; third degree cystocele with Valsalva; moderate rectocele; uterine prolapse to the introitus Cervix: Normal; parous, no cervical motion tenderness, mobile Uterus: Normal size, shape,consistency, mobile Adnexa: Normal RV: Normal external exam; normal sphincter tone; widely splayed levators confirming rectocele Bladder: Nontender  Patient pushed The #4 ring with support Out. Patient pushed The #5 ring with support out. Patient was refitted with a 78 mm gel for pessary with success.    PROCEDURE: Pessary trial #5 ring with support-Pushed out #4 ring with support-Pushed out 78 mm gelhorn-Fitted  ASSESSMENT: 1. Third-degree cystocele, asymptomatic 2. Moderate rectocele, astigmatic 3. Uterine prolapse to the introitus 4. History of breast cancer, not an ERT candidate 5. Vaginal atrophy, moderate 6. COPD secondary to long tobacco use exacerbating prolapse symptoms 7. UNSuccessful pessary fitting with #4 ring with support diaphragm 8.  Pessary refitting with a 78 mm gellhorn-successful  PLAN: 1.  Order Gellhorn 78 mm pessary 2.  Return in 2 weeks when the pessary arrives for insertion  A total of 25 minutes were spent face-to-face with the patient during this encounter and over half of that time involved counseling and coordination of care.  Brayton Mars, MD  Note: This dictation was prepared with Dragon dictation along with smaller phrase technology. Any transcriptional errors that result from this process are unintentional.

## 2015-06-02 NOTE — Patient Instructions (Signed)
1.  Refitting of pelvis for pessary is completed today. 2.  Return in 2 weeks for insertion of the GEL HORN pessary

## 2015-06-05 DIAGNOSIS — R682 Dry mouth, unspecified: Secondary | ICD-10-CM | POA: Diagnosis not present

## 2015-06-05 DIAGNOSIS — H6123 Impacted cerumen, bilateral: Secondary | ICD-10-CM | POA: Diagnosis not present

## 2015-06-19 ENCOUNTER — Encounter: Payer: Self-pay | Admitting: Obstetrics and Gynecology

## 2015-06-19 ENCOUNTER — Ambulatory Visit (INDEPENDENT_AMBULATORY_CARE_PROVIDER_SITE_OTHER): Payer: Medicare Other | Admitting: Obstetrics and Gynecology

## 2015-06-19 VITALS — BP 127/70 | HR 86 | Ht 60.0 in | Wt 131.2 lb

## 2015-06-19 DIAGNOSIS — N814 Uterovaginal prolapse, unspecified: Secondary | ICD-10-CM

## 2015-06-19 DIAGNOSIS — N8110 Cystocele, unspecified: Secondary | ICD-10-CM | POA: Diagnosis not present

## 2015-06-19 DIAGNOSIS — N816 Rectocele: Secondary | ICD-10-CM | POA: Diagnosis not present

## 2015-06-19 DIAGNOSIS — IMO0002 Reserved for concepts with insufficient information to code with codable children: Secondary | ICD-10-CM

## 2015-06-19 NOTE — Progress Notes (Signed)
Chief complaint: 1.  pessary insertion  2.   Pelvic organ prolapse ( third-degree cystocele , moderate rectocele, uterine prolapse to introitus)   Patient presents for 78 mm gel horn pessary insertion   OBJECTIVE: PELVIC: External Genitalia: Normal BUS: Normal Vagina: Atrophy present, moderate; third degree cystocele with Valsalva; moderate rectocele; uterine prolapse to the introitus Cervix: Normal; parous, no cervical motion tenderness, mobile Uterus: Normal size, shape,consistency, mobile Adnexa: Normal RV: Normal external exam; normal sphincter tone; widely splayed levators confirming rectocele Bladder: Nontender   PROCEDURE: pessary insertion -78 mm gel horn (short stem)   ASSESSMENT:  1. Pelvic organ prolapse ( third-degree cystocele , moderate rectocele, uterine prolapse to introitus)   PLAN:  1. 78 mm gel horn pessary is inserted   2. Return in 2 weeks for follow-up  Brayton Mars, MD  Note: This dictation was prepared with Dragon dictation along with smaller phrase technology. Any transcriptional errors that result from this process are unintentional.

## 2015-06-19 NOTE — Patient Instructions (Signed)
1. Return in 2 weeks for pessary check  2. Insertion Trimosan gel intravaginal once a week

## 2015-06-23 ENCOUNTER — Telehealth: Payer: Self-pay | Admitting: Obstetrics and Gynecology

## 2015-06-23 NOTE — Telephone Encounter (Signed)
Patient called and stated her pessary fell out on Thursday when she got home from her appointment. She would like a call back.Thanks

## 2015-06-23 NOTE — Telephone Encounter (Signed)
Pt states pessary fell out at home. She is not sure if she wants to try another one. Advised I will ask mad what her options are and call her back.

## 2015-06-25 NOTE — Telephone Encounter (Signed)
Pt given her options and declines surgery and pessary reinserted. Cx 4/27 appt.

## 2015-06-26 DIAGNOSIS — E118 Type 2 diabetes mellitus with unspecified complications: Secondary | ICD-10-CM | POA: Diagnosis not present

## 2015-06-26 DIAGNOSIS — E119 Type 2 diabetes mellitus without complications: Secondary | ICD-10-CM | POA: Diagnosis not present

## 2015-06-26 DIAGNOSIS — E789 Disorder of lipoprotein metabolism, unspecified: Secondary | ICD-10-CM | POA: Diagnosis not present

## 2015-07-02 DIAGNOSIS — E118 Type 2 diabetes mellitus with unspecified complications: Secondary | ICD-10-CM | POA: Diagnosis not present

## 2015-07-02 DIAGNOSIS — G629 Polyneuropathy, unspecified: Secondary | ICD-10-CM | POA: Diagnosis not present

## 2015-07-02 DIAGNOSIS — R0602 Shortness of breath: Secondary | ICD-10-CM | POA: Diagnosis not present

## 2015-07-03 ENCOUNTER — Ambulatory Visit (INDEPENDENT_AMBULATORY_CARE_PROVIDER_SITE_OTHER): Payer: Medicare Other | Admitting: Obstetrics and Gynecology

## 2015-07-03 ENCOUNTER — Ambulatory Visit: Payer: Medicare Other | Admitting: Obstetrics and Gynecology

## 2015-07-03 ENCOUNTER — Encounter: Payer: Self-pay | Admitting: Obstetrics and Gynecology

## 2015-07-03 VITALS — BP 130/80 | HR 78 | Ht 60.0 in | Wt 127.4 lb

## 2015-07-03 DIAGNOSIS — N814 Uterovaginal prolapse, unspecified: Secondary | ICD-10-CM

## 2015-07-03 DIAGNOSIS — N952 Postmenopausal atrophic vaginitis: Secondary | ICD-10-CM | POA: Diagnosis not present

## 2015-07-03 DIAGNOSIS — N816 Rectocele: Secondary | ICD-10-CM | POA: Diagnosis not present

## 2015-07-03 DIAGNOSIS — N8110 Cystocele, unspecified: Secondary | ICD-10-CM

## 2015-07-03 DIAGNOSIS — IMO0002 Reserved for concepts with insufficient information to code with codable children: Secondary | ICD-10-CM

## 2015-07-03 NOTE — Progress Notes (Signed)
Chief complaint: 1. Pessary fitting 2. Pelvic organ prolapse (third-degree cystocele, moderate rectocele, uterine prolapse to the introitus)   Previously fitted 2 and three-quarter inch Gellhorn pessary was expelled. Patient desires pessary fitting with possible larger pessary or alternative pessary.  OBJECTIVE: BP 130/80 mmHg  Pulse 78  Ht 5' (1.524 m)  Wt 127 lb 6.4 oz (57.788 kg)  BMI 24.88 kg/m2 PELVIC: External Genitalia: Normal BUS: Normal Vagina: Atrophy present, moderate; third degree cystocele with Valsalva; moderate rectocele; uterine prolapse to the introitus Cervix: Normal; parous, no cervical motion tenderness, mobile Uterus: Normal size, shape,consistency, mobile Adnexa: Normal RV: Normal external exam; normal sphincter tone; widely splayed levators confirming rectocele Bladder: Nontender  PROCEDURE: Pessary fitting  Gel Jeanet Lupe pessary, 3 inch-too large  Donut pessary, #4-excellent fit  ASSESSMENT: 1. Pelvic organ prolapse ( third-degree cystocele , moderate rectocele, uterine prolapse to introitus) 2. Successful fitting of #4 donut pessary  PLAN: 1. Patient to return when pessary arrives for insertion  A total of 15 minutes were spent face-to-face with the patient during this encounter and over half of that time dealt with counseling and coordination of care.  Note: This dictation was prepared with Dragon dictation along with smaller phrase technology. Any transcriptional errors that result from this process are unintentional.  Brayton Mars, MD

## 2015-07-03 NOTE — Patient Instructions (Addendum)
1. #4 donut pessary is fitted 2. Patient is to be notified by phone when pessary arrives

## 2015-07-10 ENCOUNTER — Other Ambulatory Visit: Payer: Self-pay

## 2015-07-10 MED ORDER — TIOTROPIUM BROMIDE MONOHYDRATE 18 MCG IN CAPS: ORAL_CAPSULE | RESPIRATORY_TRACT | Status: DC

## 2015-07-10 NOTE — Telephone Encounter (Signed)
Pt request refill spiriva to [facility]; pt last annual 05/02/15; refilled per protocol and pt voiced understanding.

## 2015-07-15 ENCOUNTER — Encounter: Payer: Self-pay | Admitting: Obstetrics and Gynecology

## 2015-07-15 ENCOUNTER — Ambulatory Visit (INDEPENDENT_AMBULATORY_CARE_PROVIDER_SITE_OTHER): Payer: Medicare Other | Admitting: Obstetrics and Gynecology

## 2015-07-15 VITALS — BP 115/70 | HR 87 | Ht 60.0 in | Wt 128.7 lb

## 2015-07-15 DIAGNOSIS — N816 Rectocele: Secondary | ICD-10-CM

## 2015-07-15 DIAGNOSIS — IMO0002 Reserved for concepts with insufficient information to code with codable children: Secondary | ICD-10-CM

## 2015-07-15 DIAGNOSIS — N8110 Cystocele, unspecified: Secondary | ICD-10-CM

## 2015-07-15 DIAGNOSIS — N814 Uterovaginal prolapse, unspecified: Secondary | ICD-10-CM

## 2015-07-15 NOTE — Progress Notes (Signed)
Patient ID: [name], female   DOB: 1946/01/01, 70 y.o.   MRN: [id number]  Chief Compliant: 1. Cystocele - 3rd degree 2. Rectocele- Moderate 3. Uterine Prolapse - to introitus  71 yo, G2P2 female presents today for pessary insertion.  Patient had previous successful fitting of #4 donut pessary.    BP 115/70 mmHg  Pulse 87  Ht 5' (1.524 m)  Wt 128 lb 11.2 oz (58.378 kg)  BMI 25.14 kg/m2  PELVIC: External Genitalia: Normal BUS: Normal Vagina: Atrophy present, moderate; third degree cystocele with Valsalva; moderate rectocele; uterine prolapse to the introitus Cervix: Normal; parous, no cervical motion tenderness, mobile Uterus: Normal size, shape,consistency, mobile Adnexa: Normal RV: Normal external exam; normal sphincter tone; widely splayed levators confirming rectocele Bladder: Nontender  PROCEDURE: Pessary insertion Donut pessary, #4, previously fitted, re-inserted using lubrication, without complication.  Assessment:  1. Uterine prolapse to introitus 2. Cystocele - 3rd degree 3. Rectocele - moderate 4. Vaginal atrophy  Plan: 1. #4 donut pessary reinserted. 2. Use Trimosan gel as directed weekly. 3. Return in 2 weeks for follow-up. 4. Return before follow up if abnormal discharge or pain develops or pessary falls out.  Lindsay P. Penninger, PA-S Brayton Mars, MD   I have seen, interviewed, and examined the patient in conjunction with the [facility].A. student and affirm the diagnosis and management plan. Rogerio Boutelle A. Preslyn Warr, MD, FACOG  Note: This dictation was prepared with Dragon dictation along with smaller phrase technology. Any transcriptional errors that result from this process are unintentional.

## 2015-07-15 NOTE — Patient Instructions (Signed)
1. #4 donut pessary reinserted 2. Use Trimosan gel as directed weekly 3. Return in 2 weeks for follow-up

## 2015-07-16 DIAGNOSIS — J209 Acute bronchitis, unspecified: Secondary | ICD-10-CM | POA: Diagnosis not present

## 2015-07-27 ENCOUNTER — Other Ambulatory Visit: Payer: Self-pay | Admitting: Internal Medicine

## 2015-07-29 ENCOUNTER — Ambulatory Visit: Payer: Medicare Other | Admitting: Obstetrics and Gynecology

## 2015-08-19 ENCOUNTER — Other Ambulatory Visit: Payer: Self-pay | Admitting: Internal Medicine

## 2015-09-08 ENCOUNTER — Encounter: Payer: Self-pay | Admitting: Internal Medicine

## 2015-09-08 MED ORDER — FLUTICASONE PROPIONATE 50 MCG/ACT NA SUSP: NASAL | Status: DC

## 2015-09-24 ENCOUNTER — Other Ambulatory Visit: Payer: Self-pay | Admitting: Internal Medicine

## 2015-09-24 DIAGNOSIS — Z1231 Encounter for screening mammogram for malignant neoplasm of breast: Secondary | ICD-10-CM

## 2015-10-08 ENCOUNTER — Other Ambulatory Visit: Payer: Self-pay

## 2015-10-08 MED ORDER — MOMETASONE FURO-FORMOTEROL FUM 200-5 MCG/ACT IN AERO: INHALATION_SPRAY | RESPIRATORY_TRACT | 3 refills | Status: DC

## 2015-10-08 NOTE — Telephone Encounter (Signed)
Rx sent electronically.  

## 2015-10-09 DIAGNOSIS — Z23 Encounter for immunization: Secondary | ICD-10-CM | POA: Diagnosis not present

## 2015-10-09 DIAGNOSIS — T148 Other injury of unspecified body region: Secondary | ICD-10-CM | POA: Diagnosis not present

## 2015-10-10 DIAGNOSIS — M79662 Pain in left lower leg: Secondary | ICD-10-CM | POA: Diagnosis not present

## 2015-10-10 DIAGNOSIS — W010XXA Fall on same level from slipping, tripping and stumbling without subsequent striking against object, initial encounter: Secondary | ICD-10-CM | POA: Diagnosis not present

## 2015-10-10 DIAGNOSIS — S81012A Laceration without foreign body, left knee, initial encounter: Secondary | ICD-10-CM | POA: Diagnosis not present

## 2015-10-10 DIAGNOSIS — M79672 Pain in left foot: Secondary | ICD-10-CM | POA: Diagnosis not present

## 2015-10-10 DIAGNOSIS — T1490 Injury, unspecified: Secondary | ICD-10-CM | POA: Diagnosis not present

## 2015-10-10 DIAGNOSIS — S9032XA Contusion of left foot, initial encounter: Secondary | ICD-10-CM | POA: Diagnosis not present

## 2015-10-10 DIAGNOSIS — Z79899 Other long term (current) drug therapy: Secondary | ICD-10-CM | POA: Diagnosis not present

## 2015-10-10 DIAGNOSIS — S8992XA Unspecified injury of left lower leg, initial encounter: Secondary | ICD-10-CM | POA: Diagnosis not present

## 2015-10-10 DIAGNOSIS — S8002XA Contusion of left knee, initial encounter: Secondary | ICD-10-CM | POA: Diagnosis not present

## 2015-10-20 ENCOUNTER — Ambulatory Visit: Payer: Medicare Other | Admitting: Internal Medicine

## 2015-10-22 ENCOUNTER — Encounter: Payer: Self-pay | Admitting: Internal Medicine

## 2015-10-22 ENCOUNTER — Ambulatory Visit (INDEPENDENT_AMBULATORY_CARE_PROVIDER_SITE_OTHER): Payer: Medicare Other | Admitting: Internal Medicine

## 2015-10-22 DIAGNOSIS — L97829 Non-pressure chronic ulcer of other part of left lower leg with unspecified severity: Secondary | ICD-10-CM | POA: Insufficient documentation

## 2015-10-22 DIAGNOSIS — S81012A Laceration without foreign body, left knee, initial encounter: Secondary | ICD-10-CM | POA: Diagnosis not present

## 2015-10-22 DIAGNOSIS — L97822 Non-pressure chronic ulcer of other part of left lower leg with fat layer exposed: Secondary | ICD-10-CM

## 2015-10-22 MED ORDER — MOMETASONE FURO-FORMOTEROL FUM 200-5 MCG/ACT IN AERO: INHALATION_SPRAY | RESPIRATORY_TRACT | 3 refills | Status: DC

## 2015-10-22 MED ORDER — PRAVASTATIN SODIUM 40 MG PO TABS: 40.0000 mg | ORAL_TABLET | Freq: Every day | ORAL | 3 refills | Status: DC

## 2015-10-22 MED ORDER — SILVER SULFADIAZINE 1 % EX CREA: 1.0000 "application " | TOPICAL_CREAM | Freq: Every day | CUTANEOUS | 0 refills | Status: DC

## 2015-10-22 NOTE — Assessment & Plan Note (Signed)
Dead skin debrided with scalpel ~15cc of congealed blood removed from under wound then Covered with silvadene and gauze Will repeat tomorrow at home and recheck 2 days

## 2015-10-22 NOTE — Patient Instructions (Signed)
Clean that open area on the knee well in the shower tomorrow morning. Blot any excessive water and dry well--then put the white antibiotic cream on it and a dressing. I will check it again on Friday.

## 2015-10-22 NOTE — Assessment & Plan Note (Signed)
Healing well Keep steristrips on

## 2015-10-22 NOTE — Progress Notes (Signed)
Subjective:    Patient ID: [name], female    DOB: 1946/01/27, 70 y.o.   MRN: [id number]  HPI Here for ER follow up [place] while at her place in [place]--tripped on carpet 13 days ago Seen at urgent care the next day--- glued it but didn't hold Then to ER Staples put in and then steristrips  Has had redness around the left knee wound Black area where dead skin was Still stings a lot Has been on antibiotic from the ER--cephalexin  Current Outpatient Prescriptions on File Prior to Visit  Medication Sig Dispense Refill  . alendronate (FOSAMAX) 70 MG tablet TK 1 T PO Q 7 DAYS  11  . Cholecalciferol (VITAMIN D3) 5000 UNITS CAPS Take 1 capsule by mouth every morning.    . citalopram (CELEXA) 20 MG tablet TAKE 1 TABLET DAILY 90 tablet 3  . fexofenadine (ALLEGRA) 180 MG tablet Take 180 mg by mouth daily as needed (nasal drainage).    . fluticasone (FLONASE) 50 MCG/ACT nasal spray USE 2 SPRAYS NASALLY DAILY 48 g 3  . glucose blood (FREESTYLE TEST STRIPS) test strip Use as instructed 100 each 12  . LYRICA 75 MG capsule Take 75 mg by mouth 3 (three) times daily.     . metformin (FORTAMET) 1000 MG (OSM) 24 hr tablet TAKE 1 TABLET TWICE A DAY 180 tablet 0  . mometasone-formoterol (DULERA) 200-5 MCG/ACT AERO Take 2 puffs first thing in am and then another 2 puffs about 12 hours later. 1 Inhaler 3  . montelukast (SINGULAIR) 10 MG tablet Take 1 tablet (10 mg total) by mouth at bedtime. 90 tablet 3  . Multiple Vitamin (MULTIVITAMIN) tablet Take 1 tablet by mouth every morning.     . Omega-3 Fatty Acids (FISH OIL) 1200 MG CAPS Take 1 capsule by mouth every morning. (place in the freezer)    . polyethylene glycol powder (GLYCOLAX/MIRALAX) powder Take 17 grams (1 capful) every morning and 9 grams (1/2 capful) every evening 850 g 10  . pravastatin (PRAVACHOL) 40 MG tablet     . tiotropium (SPIRIVA HANDIHALER) 18 MCG inhalation capsule INHALE THE CONTENTS OF ONE CAPSULE VIA HANDIHALER      DAILY 90 capsule 3  . L-Methylfolate-Algae-B12-B6 (METANX) 3-90.314-2-35 MG CAPS Take 180 mg by mouth 2 (two) times daily.     No current facility-administered medications on file prior to visit.     Allergies  Allergen Reactions  . Budesonide-Formoterol Fumarate Other (See Comments)    REACTION: coughed worse  . Lipitor [Atorvastatin] Other (See Comments)    Nosebleeds  . Penicillins Rash    REACTION: rash    Past Medical History:  Diagnosis Date  . Anxiety   . Asthma   . Breast cancer [facility])    h/o- radiation  . Carcinoid tumor of lung 1991  . COPD (chronic obstructive pulmonary disease) ([place])   . Depression   . Diabetes mellitus type II   . Diverticulosis   . Gastroparesis   . GERD (gastroesophageal reflux disease)   . History of adenomatous polyp of colon   . History of breast cancer   . Hyperlipidemia   . Mild cognitive impairment   . Osteoarthritis   . Pulmonary nodule    multiple  . Renal cell carcinoma     Past Surgical History:  Procedure Laterality Date  . BREAST LUMPECTOMY  1998   right breast  . CHOLECYSTECTOMY  1975  . LAPAROSCOPIC NEPHRECTOMY  01/2009   right  .  LUNG SURGERY  1994   carcinoid removal  . TONSILLECTOMY AND ADENOIDECTOMY  1953    Family History  Problem Relation Age of Onset  . Colon cancer Father   . Lung cancer Father   . Stroke Mother   . Breast cancer Neg Hx   . Ovarian cancer Neg Hx   . Diabetes Neg Hx     Social History   Social History  . Marital status: Married    Spouse name: N/A  . Number of children: 2  . Years of education: N/A   Occupational History  . retired Science writer Retired   Social History Main Topics  . Smoking status: Former Smoker    Packs/day: 1.00    Years: 30.00    Types: Cigarettes    Quit date: 03/08/1993  . Smokeless tobacco: Never Used  . Alcohol use No  . Drug use: No  . Sexual activity: Not Currently    Birth control/ protection: Post-menopausal   Other Topics Concern  . Not on  file   Social History Narrative   No living will   Requests husband as health care POA   Would accept attempts at resuscitation   No tube feeds if cognitively unaware            Review of Systems No fever Hasn't felt sick Walks okay Blood in foot drifted down by gravity--no pain in foot    Objective:   Physical Exam  Musculoskeletal:  Left knee swollen but not warm or tender Elliptical wound above patella seems well healed. steristrips in place--just trimmed. 6 staples removed Devitalized black skin over 3cm ulcer just under lateral patella          Assessment & Plan:

## 2015-10-24 ENCOUNTER — Encounter: Payer: Self-pay | Admitting: Internal Medicine

## 2015-10-24 ENCOUNTER — Ambulatory Visit (INDEPENDENT_AMBULATORY_CARE_PROVIDER_SITE_OTHER): Payer: Medicare Other | Admitting: Internal Medicine

## 2015-10-24 DIAGNOSIS — L97822 Non-pressure chronic ulcer of other part of left lower leg with fat layer exposed: Secondary | ICD-10-CM | POA: Diagnosis not present

## 2015-10-24 MED ORDER — CEPHALEXIN 500 MG PO CAPS: 500.0000 mg | ORAL_CAPSULE | Freq: Three times a day (TID) | ORAL | 0 refills | Status: DC

## 2015-10-24 NOTE — Assessment & Plan Note (Signed)
Improved Undermining basically resolved so no need to pack Discussed daily cleansing with soap and water, pat dry then silvadene daily Wound cleansed today and dressing applied Continue cephalexin

## 2015-10-24 NOTE — Progress Notes (Signed)
Subjective:    Patient ID: [name], female    DOB: 12-18-1945, 70 y.o.   MRN: [id number]  HPI Here for recheck of left knee wound With husband  Looking better Did drain blood when cleaned yesterday Having some pain--not bad  Current Outpatient Prescriptions on File Prior to Visit  Medication Sig Dispense Refill  . alendronate (FOSAMAX) 70 MG tablet TK 1 T PO Q 7 DAYS  11  . Cholecalciferol (VITAMIN D3) 5000 UNITS CAPS Take 1 capsule by mouth every morning.    . citalopram (CELEXA) 20 MG tablet TAKE 1 TABLET DAILY 90 tablet 3  . fexofenadine (ALLEGRA) 180 MG tablet Take 180 mg by mouth daily as needed (nasal drainage).    . fluticasone (FLONASE) 50 MCG/ACT nasal spray USE 2 SPRAYS NASALLY DAILY 48 g 3  . glucose blood (FREESTYLE TEST STRIPS) test strip Use as instructed 100 each 12  . L-Methylfolate-Algae-B12-B6 (METANX) 3-90.314-2-35 MG CAPS Take 180 mg by mouth 2 (two) times daily.    Marland Kitchen LYRICA 75 MG capsule Take 75 mg by mouth 3 (three) times daily.     . metformin (FORTAMET) 1000 MG (OSM) 24 hr tablet TAKE 1 TABLET TWICE A DAY 180 tablet 0  . mometasone-formoterol (DULERA) 200-5 MCG/ACT AERO Take 2 puffs first thing in am and then another 2 puffs about 12 hours later. 3 Inhaler 3  . montelukast (SINGULAIR) 10 MG tablet Take 1 tablet (10 mg total) by mouth at bedtime. 90 tablet 3  . Multiple Vitamin (MULTIVITAMIN) tablet Take 1 tablet by mouth every morning.     . Omega-3 Fatty Acids (FISH OIL) 1200 MG CAPS Take 1 capsule by mouth every morning. (place in the freezer)    . polyethylene glycol powder (GLYCOLAX/MIRALAX) powder Take 17 grams (1 capful) every morning and 9 grams (1/2 capful) every evening 850 g 10  . pravastatin (PRAVACHOL) 40 MG tablet Take 1 tablet (40 mg total) by mouth daily. 90 tablet 3  . silver sulfADIAZINE (SILVADENE) 1 % cream Apply 1 application topically daily. 50 g 0  . tiotropium (SPIRIVA HANDIHALER) 18 MCG inhalation capsule INHALE THE CONTENTS OF  ONE CAPSULE VIA HANDIHALER     DAILY 90 capsule 3   No current facility-administered medications on file prior to visit.     Allergies  Allergen Reactions  . Budesonide-Formoterol Fumarate Other (See Comments)    REACTION: coughed worse  . Lipitor [Atorvastatin] Other (See Comments)    Nosebleeds  . Penicillins Rash    REACTION: rash    Past Medical History:  Diagnosis Date  . Anxiety   . Asthma   . Breast cancer [facility])    h/o- radiation  . Carcinoid tumor of lung 1991  . COPD (chronic obstructive pulmonary disease) ([place])   . Depression   . Diabetes mellitus type II   . Diverticulosis   . Gastroparesis   . GERD (gastroesophageal reflux disease)   . History of adenomatous polyp of colon   . History of breast cancer   . Hyperlipidemia   . Mild cognitive impairment   . Osteoarthritis   . Pulmonary nodule    multiple  . Renal cell carcinoma     Past Surgical History:  Procedure Laterality Date  . BREAST LUMPECTOMY  1998   right breast  . CHOLECYSTECTOMY  1975  . LAPAROSCOPIC NEPHRECTOMY  01/2009   right  . LUNG SURGERY  1994   carcinoid removal  . [place]  Family History  Problem Relation Age of Onset  . Colon cancer Father   . Lung cancer Father   . Stroke Mother   . Breast cancer Neg Hx   . Ovarian cancer Neg Hx   . Diabetes Neg Hx     Social History   Social History  . Marital status: Married    Spouse name: N/A  . Number of children: 2  . Years of education: N/A   Occupational History  . retired Science writer Retired   Social History Main Topics  . Smoking status: Former Smoker    Packs/day: 1.00    Years: 30.00    Types: Cigarettes    Quit date: 03/08/1993  . Smokeless tobacco: Never Used  . Alcohol use No  . Drug use: No  . Sexual activity: Not Currently    Birth control/ protection: Post-menopausal   Other Topics Concern  . Not on file   Social History Narrative   No living will   Requests husband as  health care POA   Would accept attempts at resuscitation   No tube feeds if cognitively unaware            Review of Systems No fever Eating okay Doesn't feel sick    Objective:   Physical Exam  Constitutional: She appears well-developed. No distress.  Skin:  Ulcer is about the same--the undermining is basically gone--the skin is now adherent with just some serous drainage No sig necrotic tissue anymore Not inflamed at base Redness persists around the patella and the elliptical wound above--which is now more firmly granulated          Assessment & Plan:

## 2015-10-28 ENCOUNTER — Encounter: Payer: Self-pay | Admitting: Internal Medicine

## 2015-10-28 ENCOUNTER — Ambulatory Visit (INDEPENDENT_AMBULATORY_CARE_PROVIDER_SITE_OTHER): Payer: Medicare Other | Admitting: Internal Medicine

## 2015-10-28 DIAGNOSIS — L97822 Non-pressure chronic ulcer of other part of left lower leg with fat layer exposed: Secondary | ICD-10-CM

## 2015-10-28 NOTE — Assessment & Plan Note (Signed)
Markedly improved Redressed with silvadene and her bandage Discussed ongoing care--- should take another week or 2. Can stop silvadene and dressings when eschar solid Follow up prn

## 2015-10-28 NOTE — Progress Notes (Signed)
Pre visit review using our clinic review tool, if applicable. No additional management support is needed unless otherwise documented below in the visit note. 

## 2015-10-28 NOTE — Progress Notes (Signed)
Subjective:    Patient ID: [name], female    DOB: 1945/09/20, 70 y.o.   MRN: [id number]  HPI Here for follow up of wound on left knee With husband  Wound is better She found 2 more staples under the steristrips--once they came off  Using silvadene cream daily after cleaning in shower Redness is improved Not painful Still on the antibiotic  Current Outpatient Prescriptions on File Prior to Visit  Medication Sig Dispense Refill  . alendronate (FOSAMAX) 70 MG tablet TK 1 T PO Q 7 DAYS  11  . cephALEXin (KEFLEX) 500 MG capsule Take 1 capsule (500 mg total) by mouth 3 (three) times daily. 21 capsule 0  . Cholecalciferol (VITAMIN D3) 5000 UNITS CAPS Take 1 capsule by mouth every morning.    . citalopram (CELEXA) 20 MG tablet TAKE 1 TABLET DAILY 90 tablet 3  . fexofenadine (ALLEGRA) 180 MG tablet Take 180 mg by mouth daily as needed (nasal drainage).    . fluticasone (FLONASE) 50 MCG/ACT nasal spray USE 2 SPRAYS NASALLY DAILY 48 g 3  . glucose blood (FREESTYLE TEST STRIPS) test strip Use as instructed 100 each 12  . L-Methylfolate-Algae-B12-B6 (METANX) 3-90.314-2-35 MG CAPS Take 180 mg by mouth 2 (two) times daily.    Marland Kitchen LYRICA 75 MG capsule Take 75 mg by mouth 3 (three) times daily.     . metformin (FORTAMET) 1000 MG (OSM) 24 hr tablet TAKE 1 TABLET TWICE A DAY 180 tablet 0  . mometasone-formoterol (DULERA) 200-5 MCG/ACT AERO Take 2 puffs first thing in am and then another 2 puffs about 12 hours later. 3 Inhaler 3  . montelukast (SINGULAIR) 10 MG tablet Take 1 tablet (10 mg total) by mouth at bedtime. 90 tablet 3  . Multiple Vitamin (MULTIVITAMIN) tablet Take 1 tablet by mouth every morning.     . Omega-3 Fatty Acids (FISH OIL) 1200 MG CAPS Take 1 capsule by mouth every morning. (place in the freezer)    . polyethylene glycol powder (GLYCOLAX/MIRALAX) powder Take 17 grams (1 capful) every morning and 9 grams (1/2 capful) every evening 850 g 10  . pravastatin (PRAVACHOL) 40 MG  tablet Take 1 tablet (40 mg total) by mouth daily. 90 tablet 3  . silver sulfADIAZINE (SILVADENE) 1 % cream Apply 1 application topically daily. 50 g 0  . tiotropium (SPIRIVA HANDIHALER) 18 MCG inhalation capsule INHALE THE CONTENTS OF ONE CAPSULE VIA HANDIHALER     DAILY 90 capsule 3   No current facility-administered medications on file prior to visit.     Allergies  Allergen Reactions  . Budesonide-Formoterol Fumarate Other (See Comments)    REACTION: coughed worse  . Lipitor [Atorvastatin] Other (See Comments)    Nosebleeds  . Penicillins Rash    REACTION: rash    Past Medical History:  Diagnosis Date  . Anxiety   . Asthma   . Breast cancer [facility])    h/o- radiation  . Carcinoid tumor of lung 1991  . COPD (chronic obstructive pulmonary disease) ([place])   . Depression   . Diabetes mellitus type II   . Diverticulosis   . Gastroparesis   . GERD (gastroesophageal reflux disease)   . History of adenomatous polyp of colon   . History of breast cancer   . Hyperlipidemia   . Mild cognitive impairment   . Osteoarthritis   . Pulmonary nodule    multiple  . Renal cell carcinoma     Past Surgical History:  Procedure Laterality  Date  . BREAST LUMPECTOMY  1998   right breast  . CHOLECYSTECTOMY  1975  . LAPAROSCOPIC NEPHRECTOMY  01/2009   right  . LUNG SURGERY  1994   carcinoid removal  . TONSILLECTOMY AND ADENOIDECTOMY  1953    Family History  Problem Relation Age of Onset  . Colon cancer Father   . Lung cancer Father   . Stroke Mother   . Breast cancer Neg Hx   . Ovarian cancer Neg Hx   . Diabetes Neg Hx     Social History   Social History  . Marital status: Married    Spouse name: N/A  . Number of children: 2  . Years of education: N/A   Occupational History  . retired Science writer Retired   Social History Main Topics  . Smoking status: Former Smoker    Packs/day: 1.00    Years: 30.00    Types: Cigarettes    Quit date: 03/08/1993  . Smokeless tobacco: Never  Used  . Alcohol use No  . Drug use: No  . Sexual activity: Not Currently    Birth control/ protection: Post-menopausal   Other Topics Concern  . Not on file   Social History Narrative   No living will   Requests husband as health care POA   Would accept attempts at resuscitation   No tube feeds if cognitively unaware            Review of Systems No fever No diarrhea  Appetite is okay    Objective:   Physical Exam  Skin:  Ulcer has closed considerably---much shallower and healing at the edges Granulation all looks healthy at this point  2 retained staples in laceration above patella removed          Assessment & Plan:

## 2015-10-29 ENCOUNTER — Ambulatory Visit: Payer: Medicare Other | Admitting: Internal Medicine

## 2015-10-30 ENCOUNTER — Ambulatory Visit: Payer: Medicare Other

## 2015-11-05 ENCOUNTER — Ambulatory Visit
Admission: RE | Admit: 2015-11-05 | Discharge: 2015-11-05 | Disposition: A | Discharge status: Home / Self Care | Payer: Medicare Other | Source: Ambulatory Visit | Attending: Internal Medicine | Admitting: Internal Medicine

## 2015-11-05 DIAGNOSIS — Z1231 Encounter for screening mammogram for malignant neoplasm of breast: Secondary | ICD-10-CM

## 2015-11-06 ENCOUNTER — Encounter: Payer: Self-pay | Admitting: Family Medicine

## 2015-11-06 ENCOUNTER — Ambulatory Visit (INDEPENDENT_AMBULATORY_CARE_PROVIDER_SITE_OTHER): Payer: Medicare Other | Admitting: Family Medicine

## 2015-11-06 VITALS — BP 120/70 | HR 93 | Temp 97.8°F | Wt 125.0 lb

## 2015-11-06 DIAGNOSIS — S81802A Unspecified open wound, left lower leg, initial encounter: Secondary | ICD-10-CM

## 2015-11-06 DIAGNOSIS — L97822 Non-pressure chronic ulcer of other part of left lower leg with fat layer exposed: Secondary | ICD-10-CM | POA: Diagnosis not present

## 2015-11-06 MED ORDER — SILVER SULFADIAZINE 1 % EX CREA: 1.0000 "application " | TOPICAL_CREAM | Freq: Every day | CUTANEOUS | 0 refills | Status: DC

## 2015-11-06 NOTE — Progress Notes (Signed)
Subjective:    Patient ID: [name], female    DOB: 1945/11/30, 70 y.o.   MRN: [id number]  HPI This is a 70 yo female who is accompanied by her husband. She is here for new wound on left lower leg that occurred about 7 days ago. She was sitting and her dog jumped on her lap, his nail tore the skin on her left, lower medial leg. She has been keeping the area clean, dry and bandaged. She comes in today because it has a blister. It has decreased in size and the redness has improved over the last two days. There is no drainage. Some intermittent pain. No fever.    She would also like her left knee wound checked. She fell about 4 weeks ago and had a laceration. It was initially glued, then stapled and steri strips were applied. She finished her cephalexin yesterday. She has been in multiple times to see Dr. Silvio Pate. Wound is decreasing in size. She is washing with soap and water daily and applying SSD. She continues to keep covered with non-occlusive bandage.   Past Medical History:  Diagnosis Date  . Anxiety   . Asthma   . Breast cancer [facility])    h/o- radiation  . Carcinoid tumor of lung 1991  . COPD (chronic obstructive pulmonary disease) ([place])   . Depression   . Diabetes mellitus type II   . Diverticulosis   . Gastroparesis   . GERD (gastroesophageal reflux disease)   . History of adenomatous polyp of colon   . History of breast cancer   . Hyperlipidemia   . Mild cognitive impairment   . Osteoarthritis   . Pulmonary nodule    multiple  . Renal cell carcinoma    Past Surgical History:  Procedure Laterality Date  . BREAST LUMPECTOMY  1998   right breast  . CHOLECYSTECTOMY  1975  . LAPAROSCOPIC NEPHRECTOMY  01/2009   right  . LUNG SURGERY  1994   carcinoid removal  . TONSILLECTOMY AND ADENOIDECTOMY  1953   Family History  Problem Relation Age of Onset  . Colon cancer Father   . Lung cancer Father   . Stroke Mother   . Breast cancer Neg Hx   . Ovarian cancer Neg Hx    . Diabetes Neg Hx    Social History  Substance Use Topics  . Smoking status: Former Smoker    Packs/day: 1.00    Years: 30.00    Types: Cigarettes    Quit date: 03/08/1993  . Smokeless tobacco: Never Used  . Alcohol use No    Review of Systems Per HPI    Objective:   Physical Exam  Constitutional: She is oriented to person, place, and time. She appears well-developed and well-nourished. No distress.  HENT:  Head: Normocephalic and atraumatic.  Cardiovascular: Normal rate.   Pulmonary/Chest: Effort normal.  Neurological: She is alert and oriented to person, place, and time.  Skin: Skin is warm and dry. She is not diaphoretic.  Left knee with shallow 1.5 cm ulceration. Small amount serous drainage, good granulation, no erythema.  Left medial shin with 7x5 cm elliptical shaped intact skin flap. Blister over center. Edges pink, not warm, no drainage.   Psychiatric: She has a normal mood and affect. Her behavior is normal. Thought content normal.  Vitals reviewed.     BP 120/70   Pulse 93   Temp 97.8 F (36.6 C)   Wt 125 lb (56.7 kg)   SpO2 Marland Kitchen)  89%   BMI 24.41 kg/m  Wt Readings from Last 3 Encounters:  11/06/15 125 lb (56.7 kg)  10/28/15 126 lb (57.2 kg)  10/24/15 126 lb (57.2 kg)  7x5 cm elliptical      Assessment & Plan:  1. Leg wound, left, initial encounter - does not appear infected, no need for systemic antibiotic. Discussed wound care, blister should resolve spontaneously, instructed to avoid manipulating it. Keep clean and dry, can apply silver sulfadiazine qd, likely that skin flap will eventually detach and can be carefully cut away - RTC precautions reviewed- increased pain, fever, increased redness or purulent drainage - silver sulfADIAZINE (SILVADENE) 1 % cream; Apply 1 application topically daily.  Dispense: 50 g; Refill: 0  2. Ulcer of left knee, with fat layer exposed ([place]) - continues to heal, can leave open to air some daily - RTC precautions as  above - silver sulfADIAZINE (SILVADENE) 1 % cream; Apply 1 application topically daily.  Dispense: 50 g; Refill: 0   Clarene Reamer, FNP-BC  [facility]  11/06/2015 1:13 PM

## 2015-11-06 NOTE — Progress Notes (Signed)
Pre visit review using our clinic review tool, if applicable. No additional management support is needed unless otherwise documented below in the visit note. 

## 2015-11-06 NOTE — Patient Instructions (Signed)
Wound Care °Taking care of your wound properly can help to prevent pain and infection. It can also help your wound to heal more quickly.  °HOW TO CARE FOR YOUR WOUND  °· Take or apply over-the-counter and prescription medicines only as told by your health care provider. °· If you were prescribed antibiotic medicine, take or apply it as told by your health care provider. Do not stop using the antibiotic even if your condition improves. °· Clean the wound each day or as told by your health care provider. °¨ Wash the wound with mild soap and water. °¨ Rinse the wound with water to remove all soap. °¨ Pat the wound dry with a clean towel. Do not rub it. °· There are many different ways to close and cover a wound. For example, a wound can be covered with stitches (sutures), skin glue, or adhesive strips. Follow instructions from your health care provider about: °¨ How to take care of your wound. °¨ When and how you should change your bandage (dressing). °¨ When you should remove your dressing. °¨ Removing whatever was used to close your wound. °· Check your wound every day for signs of infection. Watch for: °¨ Redness, swelling, or pain. °¨ Fluid, blood, or pus. °· Keep the dressing dry until your health care provider says it can be removed. Do not take baths, swim, use a hot tub, or do anything that would put your wound underwater until your health care provider approves. °· Raise (elevate) the injured area above the level of your heart while you are sitting or lying down. °· Do not scratch or pick at the wound. °· Keep all follow-up visits as told by your health care provider. This is important. °SEEK MEDICAL CARE IF: °· You received a tetanus shot and you have swelling, severe pain, redness, or bleeding at the injection site. °· You have a fever. °· Your pain is not controlled with medicine. °· You have increased redness, swelling, or pain at the site of your wound. °· You have fluid, blood, or pus coming from your  wound. °· You notice a bad smell coming from your wound or your dressing. °SEEK IMMEDIATE MEDICAL CARE IF: °· You have a red streak going away from your wound. °  °This information is not intended to replace advice given to you by your health care provider. Make sure you discuss any questions you have with your health care provider. °  °Document Released: 12/02/2007 Document Revised: 07/09/2014 Document Reviewed: 02/18/2014 °Elsevier Interactive Patient Education ©2016 [employer] ° °

## 2015-12-04 DIAGNOSIS — E789 Disorder of lipoprotein metabolism, unspecified: Secondary | ICD-10-CM | POA: Diagnosis not present

## 2015-12-04 DIAGNOSIS — E119 Type 2 diabetes mellitus without complications: Secondary | ICD-10-CM | POA: Diagnosis not present

## 2015-12-10 DIAGNOSIS — G629 Polyneuropathy, unspecified: Secondary | ICD-10-CM | POA: Diagnosis not present

## 2015-12-10 DIAGNOSIS — Z23 Encounter for immunization: Secondary | ICD-10-CM | POA: Diagnosis not present

## 2015-12-10 DIAGNOSIS — E118 Type 2 diabetes mellitus with unspecified complications: Secondary | ICD-10-CM | POA: Diagnosis not present

## 2015-12-10 DIAGNOSIS — R0602 Shortness of breath: Secondary | ICD-10-CM | POA: Diagnosis not present

## 2015-12-25 DIAGNOSIS — M5136 Other intervertebral disc degeneration, lumbar region: Secondary | ICD-10-CM | POA: Diagnosis not present

## 2015-12-25 DIAGNOSIS — M545 Low back pain: Secondary | ICD-10-CM | POA: Diagnosis not present

## 2015-12-25 DIAGNOSIS — M791 Myalgia: Secondary | ICD-10-CM | POA: Diagnosis not present

## 2015-12-25 DIAGNOSIS — M9905 Segmental and somatic dysfunction of pelvic region: Secondary | ICD-10-CM | POA: Diagnosis not present

## 2015-12-25 DIAGNOSIS — M5418 Radiculopathy, sacral and sacrococcygeal region: Secondary | ICD-10-CM | POA: Diagnosis not present

## 2015-12-25 DIAGNOSIS — M5137 Other intervertebral disc degeneration, lumbosacral region: Secondary | ICD-10-CM | POA: Diagnosis not present

## 2015-12-25 DIAGNOSIS — M9904 Segmental and somatic dysfunction of sacral region: Secondary | ICD-10-CM | POA: Diagnosis not present

## 2015-12-25 DIAGNOSIS — M5417 Radiculopathy, lumbosacral region: Secondary | ICD-10-CM | POA: Diagnosis not present

## 2015-12-25 DIAGNOSIS — M25511 Pain in right shoulder: Secondary | ICD-10-CM | POA: Diagnosis not present

## 2015-12-26 DIAGNOSIS — M5417 Radiculopathy, lumbosacral region: Secondary | ICD-10-CM | POA: Diagnosis not present

## 2015-12-26 DIAGNOSIS — M791 Myalgia: Secondary | ICD-10-CM | POA: Diagnosis not present

## 2015-12-26 DIAGNOSIS — M545 Low back pain: Secondary | ICD-10-CM | POA: Diagnosis not present

## 2015-12-26 DIAGNOSIS — M25511 Pain in right shoulder: Secondary | ICD-10-CM | POA: Diagnosis not present

## 2015-12-26 DIAGNOSIS — M5136 Other intervertebral disc degeneration, lumbar region: Secondary | ICD-10-CM | POA: Diagnosis not present

## 2015-12-26 DIAGNOSIS — M5137 Other intervertebral disc degeneration, lumbosacral region: Secondary | ICD-10-CM | POA: Diagnosis not present

## 2015-12-26 DIAGNOSIS — M9904 Segmental and somatic dysfunction of sacral region: Secondary | ICD-10-CM | POA: Diagnosis not present

## 2015-12-26 DIAGNOSIS — M5418 Radiculopathy, sacral and sacrococcygeal region: Secondary | ICD-10-CM | POA: Diagnosis not present

## 2015-12-26 DIAGNOSIS — M9905 Segmental and somatic dysfunction of pelvic region: Secondary | ICD-10-CM | POA: Diagnosis not present

## 2015-12-29 DIAGNOSIS — M5137 Other intervertebral disc degeneration, lumbosacral region: Secondary | ICD-10-CM | POA: Diagnosis not present

## 2015-12-29 DIAGNOSIS — M5418 Radiculopathy, sacral and sacrococcygeal region: Secondary | ICD-10-CM | POA: Diagnosis not present

## 2015-12-29 DIAGNOSIS — M25511 Pain in right shoulder: Secondary | ICD-10-CM | POA: Diagnosis not present

## 2015-12-29 DIAGNOSIS — M545 Low back pain: Secondary | ICD-10-CM | POA: Diagnosis not present

## 2015-12-29 DIAGNOSIS — M5136 Other intervertebral disc degeneration, lumbar region: Secondary | ICD-10-CM | POA: Diagnosis not present

## 2015-12-29 DIAGNOSIS — M791 Myalgia: Secondary | ICD-10-CM | POA: Diagnosis not present

## 2015-12-29 DIAGNOSIS — M9905 Segmental and somatic dysfunction of pelvic region: Secondary | ICD-10-CM | POA: Diagnosis not present

## 2015-12-29 DIAGNOSIS — M9904 Segmental and somatic dysfunction of sacral region: Secondary | ICD-10-CM | POA: Diagnosis not present

## 2015-12-29 DIAGNOSIS — M5417 Radiculopathy, lumbosacral region: Secondary | ICD-10-CM | POA: Diagnosis not present

## 2015-12-30 DIAGNOSIS — M5137 Other intervertebral disc degeneration, lumbosacral region: Secondary | ICD-10-CM | POA: Diagnosis not present

## 2015-12-30 DIAGNOSIS — M5417 Radiculopathy, lumbosacral region: Secondary | ICD-10-CM | POA: Diagnosis not present

## 2015-12-30 DIAGNOSIS — M791 Myalgia: Secondary | ICD-10-CM | POA: Diagnosis not present

## 2015-12-30 DIAGNOSIS — M25511 Pain in right shoulder: Secondary | ICD-10-CM | POA: Diagnosis not present

## 2015-12-30 DIAGNOSIS — M9905 Segmental and somatic dysfunction of pelvic region: Secondary | ICD-10-CM | POA: Diagnosis not present

## 2015-12-30 DIAGNOSIS — M5136 Other intervertebral disc degeneration, lumbar region: Secondary | ICD-10-CM | POA: Diagnosis not present

## 2015-12-30 DIAGNOSIS — M545 Low back pain: Secondary | ICD-10-CM | POA: Diagnosis not present

## 2015-12-30 DIAGNOSIS — M9904 Segmental and somatic dysfunction of sacral region: Secondary | ICD-10-CM | POA: Diagnosis not present

## 2015-12-30 DIAGNOSIS — M5418 Radiculopathy, sacral and sacrococcygeal region: Secondary | ICD-10-CM | POA: Diagnosis not present

## 2016-01-05 DIAGNOSIS — M791 Myalgia: Secondary | ICD-10-CM | POA: Diagnosis not present

## 2016-01-05 DIAGNOSIS — M9905 Segmental and somatic dysfunction of pelvic region: Secondary | ICD-10-CM | POA: Diagnosis not present

## 2016-01-05 DIAGNOSIS — M5136 Other intervertebral disc degeneration, lumbar region: Secondary | ICD-10-CM | POA: Diagnosis not present

## 2016-01-05 DIAGNOSIS — M5418 Radiculopathy, sacral and sacrococcygeal region: Secondary | ICD-10-CM | POA: Diagnosis not present

## 2016-01-05 DIAGNOSIS — M9904 Segmental and somatic dysfunction of sacral region: Secondary | ICD-10-CM | POA: Diagnosis not present

## 2016-01-05 DIAGNOSIS — M545 Low back pain: Secondary | ICD-10-CM | POA: Diagnosis not present

## 2016-01-05 DIAGNOSIS — M5417 Radiculopathy, lumbosacral region: Secondary | ICD-10-CM | POA: Diagnosis not present

## 2016-01-05 DIAGNOSIS — M5137 Other intervertebral disc degeneration, lumbosacral region: Secondary | ICD-10-CM | POA: Diagnosis not present

## 2016-01-05 DIAGNOSIS — M25511 Pain in right shoulder: Secondary | ICD-10-CM | POA: Diagnosis not present

## 2016-01-06 DIAGNOSIS — M5418 Radiculopathy, sacral and sacrococcygeal region: Secondary | ICD-10-CM | POA: Diagnosis not present

## 2016-01-06 DIAGNOSIS — M9904 Segmental and somatic dysfunction of sacral region: Secondary | ICD-10-CM | POA: Diagnosis not present

## 2016-01-06 DIAGNOSIS — M791 Myalgia: Secondary | ICD-10-CM | POA: Diagnosis not present

## 2016-01-06 DIAGNOSIS — M9905 Segmental and somatic dysfunction of pelvic region: Secondary | ICD-10-CM | POA: Diagnosis not present

## 2016-01-06 DIAGNOSIS — M5136 Other intervertebral disc degeneration, lumbar region: Secondary | ICD-10-CM | POA: Diagnosis not present

## 2016-01-06 DIAGNOSIS — M545 Low back pain: Secondary | ICD-10-CM | POA: Diagnosis not present

## 2016-01-06 DIAGNOSIS — M5417 Radiculopathy, lumbosacral region: Secondary | ICD-10-CM | POA: Diagnosis not present

## 2016-01-06 DIAGNOSIS — M5137 Other intervertebral disc degeneration, lumbosacral region: Secondary | ICD-10-CM | POA: Diagnosis not present

## 2016-01-06 DIAGNOSIS — M25511 Pain in right shoulder: Secondary | ICD-10-CM | POA: Diagnosis not present

## 2016-01-07 ENCOUNTER — Encounter: Payer: Self-pay | Admitting: Internal Medicine

## 2016-01-08 DIAGNOSIS — Z6821 Body mass index (BMI) 21.0-21.9, adult: Secondary | ICD-10-CM | POA: Diagnosis not present

## 2016-01-08 DIAGNOSIS — R2681 Unsteadiness on feet: Secondary | ICD-10-CM | POA: Diagnosis not present

## 2016-01-08 DIAGNOSIS — Z853 Personal history of malignant neoplasm of breast: Secondary | ICD-10-CM | POA: Diagnosis not present

## 2016-01-08 DIAGNOSIS — Z8553 Personal history of malignant neoplasm of renal pelvis: Secondary | ICD-10-CM | POA: Diagnosis not present

## 2016-01-08 DIAGNOSIS — E1122 Type 2 diabetes mellitus with diabetic chronic kidney disease: Secondary | ICD-10-CM | POA: Diagnosis not present

## 2016-01-08 DIAGNOSIS — R269 Unspecified abnormalities of gait and mobility: Secondary | ICD-10-CM | POA: Diagnosis not present

## 2016-01-08 DIAGNOSIS — F0390 Unspecified dementia without behavioral disturbance: Secondary | ICD-10-CM | POA: Diagnosis not present

## 2016-01-08 DIAGNOSIS — E1149 Type 2 diabetes mellitus with other diabetic neurological complication: Secondary | ICD-10-CM | POA: Diagnosis not present

## 2016-01-08 DIAGNOSIS — Z85528 Personal history of other malignant neoplasm of kidney: Secondary | ICD-10-CM | POA: Diagnosis not present

## 2016-01-08 DIAGNOSIS — Z9181 History of falling: Secondary | ICD-10-CM | POA: Diagnosis not present

## 2016-01-08 DIAGNOSIS — M8580 Other specified disorders of bone density and structure, unspecified site: Secondary | ICD-10-CM | POA: Diagnosis not present

## 2016-01-08 DIAGNOSIS — Z7984 Long term (current) use of oral hypoglycemic drugs: Secondary | ICD-10-CM | POA: Diagnosis not present

## 2016-01-08 DIAGNOSIS — J449 Chronic obstructive pulmonary disease, unspecified: Secondary | ICD-10-CM | POA: Diagnosis not present

## 2016-01-08 DIAGNOSIS — Z7951 Long term (current) use of inhaled steroids: Secondary | ICD-10-CM | POA: Diagnosis not present

## 2016-01-08 DIAGNOSIS — F0150 Vascular dementia without behavioral disturbance: Secondary | ICD-10-CM | POA: Diagnosis not present

## 2016-01-08 DIAGNOSIS — G629 Polyneuropathy, unspecified: Secondary | ICD-10-CM | POA: Diagnosis not present

## 2016-01-08 DIAGNOSIS — Z905 Acquired absence of kidney: Secondary | ICD-10-CM | POA: Diagnosis not present

## 2016-01-08 DIAGNOSIS — N189 Chronic kidney disease, unspecified: Secondary | ICD-10-CM | POA: Diagnosis not present

## 2016-01-08 NOTE — Telephone Encounter (Signed)
Please fax order

## 2016-01-12 DIAGNOSIS — M5137 Other intervertebral disc degeneration, lumbosacral region: Secondary | ICD-10-CM | POA: Diagnosis not present

## 2016-01-12 DIAGNOSIS — M25511 Pain in right shoulder: Secondary | ICD-10-CM | POA: Diagnosis not present

## 2016-01-12 DIAGNOSIS — M545 Low back pain: Secondary | ICD-10-CM | POA: Diagnosis not present

## 2016-01-12 DIAGNOSIS — M9904 Segmental and somatic dysfunction of sacral region: Secondary | ICD-10-CM | POA: Diagnosis not present

## 2016-01-12 DIAGNOSIS — M5417 Radiculopathy, lumbosacral region: Secondary | ICD-10-CM | POA: Diagnosis not present

## 2016-01-12 DIAGNOSIS — M5418 Radiculopathy, sacral and sacrococcygeal region: Secondary | ICD-10-CM | POA: Diagnosis not present

## 2016-01-12 DIAGNOSIS — M9905 Segmental and somatic dysfunction of pelvic region: Secondary | ICD-10-CM | POA: Diagnosis not present

## 2016-01-12 DIAGNOSIS — M5136 Other intervertebral disc degeneration, lumbar region: Secondary | ICD-10-CM | POA: Diagnosis not present

## 2016-01-12 DIAGNOSIS — M791 Myalgia: Secondary | ICD-10-CM | POA: Diagnosis not present

## 2016-01-13 ENCOUNTER — Other Ambulatory Visit: Payer: Self-pay | Admitting: Internal Medicine

## 2016-01-14 ENCOUNTER — Other Ambulatory Visit: Payer: Self-pay

## 2016-01-14 DIAGNOSIS — M6281 Muscle weakness (generalized): Secondary | ICD-10-CM | POA: Diagnosis not present

## 2016-01-14 DIAGNOSIS — M5137 Other intervertebral disc degeneration, lumbosacral region: Secondary | ICD-10-CM | POA: Diagnosis not present

## 2016-01-14 DIAGNOSIS — J449 Chronic obstructive pulmonary disease, unspecified: Secondary | ICD-10-CM | POA: Diagnosis not present

## 2016-01-14 DIAGNOSIS — M9904 Segmental and somatic dysfunction of sacral region: Secondary | ICD-10-CM | POA: Diagnosis not present

## 2016-01-14 DIAGNOSIS — M9905 Segmental and somatic dysfunction of pelvic region: Secondary | ICD-10-CM | POA: Diagnosis not present

## 2016-01-14 DIAGNOSIS — R269 Unspecified abnormalities of gait and mobility: Secondary | ICD-10-CM | POA: Diagnosis not present

## 2016-01-14 DIAGNOSIS — M5418 Radiculopathy, sacral and sacrococcygeal region: Secondary | ICD-10-CM | POA: Diagnosis not present

## 2016-01-14 DIAGNOSIS — M791 Myalgia: Secondary | ICD-10-CM | POA: Diagnosis not present

## 2016-01-14 DIAGNOSIS — M5136 Other intervertebral disc degeneration, lumbar region: Secondary | ICD-10-CM | POA: Diagnosis not present

## 2016-01-14 DIAGNOSIS — M25511 Pain in right shoulder: Secondary | ICD-10-CM | POA: Diagnosis not present

## 2016-01-14 DIAGNOSIS — M545 Low back pain: Secondary | ICD-10-CM | POA: Diagnosis not present

## 2016-01-14 DIAGNOSIS — M5417 Radiculopathy, lumbosacral region: Secondary | ICD-10-CM | POA: Diagnosis not present

## 2016-01-16 DIAGNOSIS — H903 Sensorineural hearing loss, bilateral: Secondary | ICD-10-CM | POA: Diagnosis not present

## 2016-01-22 DIAGNOSIS — M6281 Muscle weakness (generalized): Secondary | ICD-10-CM | POA: Diagnosis not present

## 2016-01-22 DIAGNOSIS — J449 Chronic obstructive pulmonary disease, unspecified: Secondary | ICD-10-CM | POA: Diagnosis not present

## 2016-01-22 DIAGNOSIS — R269 Unspecified abnormalities of gait and mobility: Secondary | ICD-10-CM | POA: Diagnosis not present

## 2016-02-03 DIAGNOSIS — M6281 Muscle weakness (generalized): Secondary | ICD-10-CM | POA: Diagnosis not present

## 2016-02-03 DIAGNOSIS — R269 Unspecified abnormalities of gait and mobility: Secondary | ICD-10-CM | POA: Diagnosis not present

## 2016-02-03 DIAGNOSIS — J449 Chronic obstructive pulmonary disease, unspecified: Secondary | ICD-10-CM | POA: Diagnosis not present

## 2016-02-05 DIAGNOSIS — R269 Unspecified abnormalities of gait and mobility: Secondary | ICD-10-CM | POA: Diagnosis not present

## 2016-02-05 DIAGNOSIS — M6281 Muscle weakness (generalized): Secondary | ICD-10-CM | POA: Diagnosis not present

## 2016-02-05 DIAGNOSIS — J449 Chronic obstructive pulmonary disease, unspecified: Secondary | ICD-10-CM | POA: Diagnosis not present

## 2016-02-13 DIAGNOSIS — M6281 Muscle weakness (generalized): Secondary | ICD-10-CM | POA: Diagnosis not present

## 2016-02-13 DIAGNOSIS — R269 Unspecified abnormalities of gait and mobility: Secondary | ICD-10-CM | POA: Diagnosis not present

## 2016-02-13 DIAGNOSIS — J449 Chronic obstructive pulmonary disease, unspecified: Secondary | ICD-10-CM | POA: Diagnosis not present

## 2016-02-17 DIAGNOSIS — R269 Unspecified abnormalities of gait and mobility: Secondary | ICD-10-CM | POA: Diagnosis not present

## 2016-02-17 DIAGNOSIS — J449 Chronic obstructive pulmonary disease, unspecified: Secondary | ICD-10-CM | POA: Diagnosis not present

## 2016-02-17 DIAGNOSIS — M6281 Muscle weakness (generalized): Secondary | ICD-10-CM | POA: Diagnosis not present

## 2016-02-19 DIAGNOSIS — M6281 Muscle weakness (generalized): Secondary | ICD-10-CM | POA: Diagnosis not present

## 2016-02-19 DIAGNOSIS — R269 Unspecified abnormalities of gait and mobility: Secondary | ICD-10-CM | POA: Diagnosis not present

## 2016-02-19 DIAGNOSIS — J449 Chronic obstructive pulmonary disease, unspecified: Secondary | ICD-10-CM | POA: Diagnosis not present

## 2016-03-22 ENCOUNTER — Encounter: Payer: Self-pay | Admitting: Gastroenterology

## 2016-03-23 DIAGNOSIS — E118 Type 2 diabetes mellitus with unspecified complications: Secondary | ICD-10-CM | POA: Diagnosis not present

## 2016-03-31 ENCOUNTER — Telehealth: Payer: Self-pay | Admitting: Gastroenterology

## 2016-03-31 DIAGNOSIS — G629 Polyneuropathy, unspecified: Secondary | ICD-10-CM | POA: Diagnosis not present

## 2016-03-31 DIAGNOSIS — E118 Type 2 diabetes mellitus with unspecified complications: Secondary | ICD-10-CM | POA: Diagnosis not present

## 2016-03-31 NOTE — Telephone Encounter (Signed)
Looking back at Dr. Nichola Sizer colonoscopy in 2013, there was a recall in for 5 years. Patient had a colonoscopy at [facility] in 2016. Looking at that path report looks like they are recommending a recall colonoscopy in 3 years. Please advise on when patient should have her colonoscopy, 2018 or 2019? Thank you.

## 2016-03-31 NOTE — Telephone Encounter (Signed)
Follow up based on findings of most recent colonoscopy.  So if endoscopist in 2016 thought three years was appropriate based on findings, then it should be 2019.  If she plans to see our practice for that, then please place the recall.

## 2016-04-02 ENCOUNTER — Encounter: Payer: Self-pay | Admitting: Gastroenterology

## 2016-04-02 NOTE — Progress Notes (Signed)
Letter mailed re: follow up.

## 2016-04-02 NOTE — Telephone Encounter (Signed)
Left message for patient, have not heard back from her, sent letter regarding colonoscopy follow up.

## 2016-04-08 LAB — HM DIABETES EYE EXAM

## 2016-04-13 ENCOUNTER — Other Ambulatory Visit: Payer: Self-pay | Admitting: Internal Medicine

## 2016-04-19 DIAGNOSIS — H2511 Age-related nuclear cataract, right eye: Secondary | ICD-10-CM | POA: Diagnosis not present

## 2016-05-04 ENCOUNTER — Encounter: Payer: Self-pay | Admitting: Internal Medicine

## 2016-05-04 ENCOUNTER — Ambulatory Visit (INDEPENDENT_AMBULATORY_CARE_PROVIDER_SITE_OTHER): Payer: Medicare Other | Admitting: Internal Medicine

## 2016-05-04 VITALS — BP 106/64 | HR 74 | Temp 97.4°F | Ht 61.75 in | Wt 123.0 lb

## 2016-05-04 DIAGNOSIS — E1140 Type 2 diabetes mellitus with diabetic neuropathy, unspecified: Secondary | ICD-10-CM

## 2016-05-04 DIAGNOSIS — Z0000 Encounter for general adult medical examination without abnormal findings: Secondary | ICD-10-CM | POA: Diagnosis not present

## 2016-05-04 DIAGNOSIS — J449 Chronic obstructive pulmonary disease, unspecified: Secondary | ICD-10-CM

## 2016-05-04 DIAGNOSIS — F0150 Vascular dementia without behavioral disturbance: Secondary | ICD-10-CM | POA: Diagnosis not present

## 2016-05-04 DIAGNOSIS — Z7189 Other specified counseling: Secondary | ICD-10-CM

## 2016-05-04 DIAGNOSIS — F39 Unspecified mood [affective] disorder: Secondary | ICD-10-CM | POA: Diagnosis not present

## 2016-05-04 LAB — CBC WITH DIFFERENTIAL/PLATELET
Basophils Absolute: 0.1 K/uL (ref 0.0–0.1)
Basophils Relative: 0.7 % (ref 0.0–3.0)
Eosinophils Absolute: 0.3 K/uL (ref 0.0–0.7)
Eosinophils Relative: 2.8 % (ref 0.0–5.0)
HCT: 39.7 % (ref 36.0–46.0)
Hemoglobin: 12.9 g/dL (ref 12.0–15.0)
Lymphocytes Relative: 37.6 % (ref 12.0–46.0)
Lymphs Abs: 4 K/uL (ref 0.7–4.0)
MCHC: 32.6 g/dL (ref 30.0–36.0)
MCV: 87.9 fl (ref 78.0–100.0)
Monocytes Absolute: 0.6 K/uL (ref 0.1–1.0)
Monocytes Relative: 5.5 % (ref 3.0–12.0)
Neutro Abs: 5.6 K/uL (ref 1.4–7.7)
Neutrophils Relative %: 53.4 % (ref 43.0–77.0)
Platelets: 280 K/uL (ref 150.0–400.0)
RBC: 4.52 Mil/uL (ref 3.87–5.11)
RDW: 15.7 % — ABNORMAL HIGH (ref 11.5–15.5)
WBC: 10.5 K/uL (ref 4.0–10.5)

## 2016-05-04 LAB — HM DIABETES FOOT EXAM

## 2016-05-04 LAB — MICROALBUMIN / CREATININE URINE RATIO
Creatinine,U: 45.6 mg/dL
MICROALB/CREAT RATIO: 1.5 mg/g (ref 0.0–30.0)

## 2016-05-04 LAB — COMPREHENSIVE METABOLIC PANEL WITH GFR
ALT: 14 U/L (ref 0–35)
AST: 14 U/L (ref 0–37)
Albumin: 3.9 g/dL (ref 3.5–5.2)
Alkaline Phosphatase: 56 U/L (ref 39–117)
BUN: 23 mg/dL (ref 6–23)
CO2: 27 meq/L (ref 19–32)
Calcium: 9 mg/dL (ref 8.4–10.5)
Chloride: 108 meq/L (ref 96–112)
Creatinine, Ser: 1.46 mg/dL — ABNORMAL HIGH (ref 0.40–1.20)
GFR: 37.35 mL/min — ABNORMAL LOW
Glucose, Bld: 94 mg/dL (ref 70–99)
Potassium: 4.6 meq/L (ref 3.5–5.1)
Sodium: 142 meq/L (ref 135–145)
Total Bilirubin: 0.3 mg/dL (ref 0.2–1.2)
Total Protein: 6.8 g/dL (ref 6.0–8.3)

## 2016-05-04 LAB — T4, FREE: Free T4: 0.89 ng/dL (ref 0.60–1.60)

## 2016-05-04 LAB — LIPID PANEL
CHOLESTEROL: 175 mg/dL (ref 0–200)
HDL: 49.3 mg/dL (ref >39.00)
LDL Cholesterol: 99 mg/dL (ref 0–99)
NonHDL: 125.92
TRIGLYCERIDES: 135 mg/dL (ref 0.0–149.0)
Total CHOL/HDL Ratio: 4
VLDL: 27 mg/dL (ref 0.0–40.0)

## 2016-05-04 LAB — HEMOGLOBIN A1C: Hgb A1c MFr Bld: 6.8 % — ABNORMAL HIGH (ref 4.6–6.5)

## 2016-05-04 MED ORDER — MONTELUKAST SODIUM 10 MG PO TABS: 10.0000 mg | ORAL_TABLET | Freq: Every day | ORAL | 3 refills | Status: DC

## 2016-05-04 NOTE — Assessment & Plan Note (Signed)
Hopefully still good control On lyrica for the neuropathy

## 2016-05-04 NOTE — Addendum Note (Signed)
Addended by: Lurlean Nanny on: 05/04/2016 11:38 AM   Modules accepted: Orders

## 2016-05-04 NOTE — Assessment & Plan Note (Signed)
Mild symptoms controlled on  The citalopram. No wean indicated due to chronicity at this point

## 2016-05-04 NOTE — Assessment & Plan Note (Signed)
I have personally reviewed the Medicare Annual Wellness questionnaire and have noted 1. The patient's medical and social history 2. Their use of alcohol, tobacco or illicit drugs 3. Their current medications and supplements 4. The patient's functional ability including ADL's, fall risks, home safety risks and hearing or visual             impairment. 5. Diet and physical activities 6. Evidence for depression or mood disorders  The patients weight, height, BMI and visual acuity have been recorded in the chart I have made referrals, counseling and provided education to the patient based review of the above and I have provided the pt with a written personalized care plan for preventive services.  I have provided you with a copy of your personalized plan for preventive services. Please take the time to review along with your updated medication list.  UTD on imms Due for colon soon--they said they were told another 2-3 years though mammo due 2019 Discussed exercise--especially for leg strength

## 2016-05-04 NOTE — Assessment & Plan Note (Signed)
Mild Follows at [place] Has been fairly stable--certainly for function

## 2016-05-04 NOTE — Progress Notes (Signed)
Subjective:    Patient ID: [name], female    DOB: 20-Oct-1945, 71 y.o.   MRN: [id number]  HPI Here for Medicare wellness visit and follow up of chronic medical conditions With husband Reviewed form and advanced directives Reviewed other doctors No alcohol or tobacco Did have fall with sig injury to knee--now finally better. One other fall as well Ongoing mood and memory issues Vision okay Uses hearing aides--they help  Follows at [facility] for the vascular dementia Seems to be stable Still drives--but only local Still does all ADLs and instrumental ADLs  Checks sugars rarely Still sees Dr Leeanne Mannan controlled Numbness in feet persists--- pain controlled by lyrica (is on decreased dose) Had dilated eye exam with Dr Thomasene Ripple at the beginning of this month--no retinopathy  Mood has been good Continues on the citalopram No persistent depression. Not anhedonic  Lungs seem to be okay No regular cough or wheeze on the dulera Not much exercise--just does yard work. Stamina stable of late at least Hasn't needed to use her albuterol inhaler  Current Outpatient Prescriptions on File Prior to Visit  Medication Sig Dispense Refill  . alendronate (FOSAMAX) 70 MG tablet TK 1 T PO Q 7 DAYS  11  . Cholecalciferol (VITAMIN D3) 5000 UNITS CAPS Take 1 capsule by mouth every morning.    . citalopram (CELEXA) 20 MG tablet TAKE 1 TABLET DAILY 90 tablet 1  . fexofenadine (ALLEGRA) 180 MG tablet Take 180 mg by mouth daily as needed (nasal drainage).    . fluticasone (FLONASE) 50 MCG/ACT nasal spray USE 2 SPRAYS NASALLY DAILY 48 g 3  . glucose blood (FREESTYLE TEST STRIPS) test strip Use as instructed 100 each 12  . LYRICA 75 MG capsule Take 75 mg by mouth 2 (two) times daily.     . metformin (FORTAMET) 1000 MG (OSM) 24 hr tablet TAKE 1 TABLET TWICE A DAY 180 tablet 1  . mometasone-formoterol (DULERA) 200-5 MCG/ACT AERO Take 2 puffs first thing in am and then another 2 puffs about 12  hours later. 3 Inhaler 3  . montelukast (SINGULAIR) 10 MG tablet Take 1 tablet (10 mg total) by mouth at bedtime. 90 tablet 3  . Multiple Vitamin (MULTIVITAMIN) tablet Take 1 tablet by mouth every morning.     . Omega-3 Fatty Acids (FISH OIL) 1200 MG CAPS Take 1 capsule by mouth every morning. (place in the freezer)    . polyethylene glycol powder (GLYCOLAX/MIRALAX) powder Take 17 grams (1 capful) every morning and 9 grams (1/2 capful) every evening 850 g 10  . pravastatin (PRAVACHOL) 40 MG tablet Take 1 tablet (40 mg total) by mouth daily. 90 tablet 3  . silver sulfADIAZINE (SILVADENE) 1 % cream Apply 1 application topically daily. 50 g 0  . SPIRIVA HANDIHALER 18 MCG inhalation capsule INHALE THE CONTENTS OF 1   CAPSULE VIA HANDIHALER     DAILY 90 capsule 3   No current facility-administered medications on file prior to visit.     Allergies  Allergen Reactions  . Budesonide-Formoterol Fumarate Other (See Comments)    REACTION: coughed worse  . Lipitor [Atorvastatin] Other (See Comments)    Nosebleeds  . Penicillins Rash    REACTION: rash    Past Medical History:  Diagnosis Date  . Anxiety   . Asthma   . Breast cancer [facility])    h/o- radiation  . Carcinoid tumor of lung 1991  . COPD (chronic obstructive pulmonary disease) ([place])   . Depression   .  Diabetes mellitus type II   . Diverticulosis   . Gastroparesis   . GERD (gastroesophageal reflux disease)   . History of adenomatous polyp of colon   . History of breast cancer   . Hyperlipidemia   . Mild cognitive impairment   . Osteoarthritis   . Pulmonary nodule    multiple  . Renal cell carcinoma     Past Surgical History:  Procedure Laterality Date  . BREAST LUMPECTOMY  1998   right breast  . CHOLECYSTECTOMY  1975  . LAPAROSCOPIC NEPHRECTOMY  01/2009   right  . LUNG SURGERY  1994   carcinoid removal  . TONSILLECTOMY AND ADENOIDECTOMY  1953    Family History  Problem Relation Age of Onset  . Colon cancer Father     . Lung cancer Father   . Stroke Mother   . Breast cancer Neg Hx   . Ovarian cancer Neg Hx   . Diabetes Neg Hx     Social History   Social History  . Marital status: Married    Spouse name: N/A  . Number of children: 2  . Years of education: N/A   Occupational History  . retired Science writer Retired   Social History Main Topics  . Smoking status: Former Smoker    Packs/day: 1.00    Years: 30.00    Types: Cigarettes    Quit date: 03/08/1993  . Smokeless tobacco: Never Used  . Alcohol use No  . Drug use: No  . Sexual activity: Not Currently    Birth control/ protection: Post-menopausal   Other Topics Concern  . Not on file   Social History Narrative   No living will   Requests husband as health care POA   Would accept attempts at resuscitation   No tube feeds if cognitively unaware            Review of Systems Appetite is good Weight is stable Sleeps well Wears seat belt Teeth are fine---full dentures No back pain or joint problems Bowels are okay--no blood. No skin rash or suspicious skin lesions No heartburn or dysphagia    Objective:   Physical Exam  Constitutional: She appears well-developed and well-nourished. No distress.  HENT:  Mouth/Throat: Oropharynx is clear and moist. No oropharyngeal exudate.  Neck: Neck supple. No thyromegaly present.  Cardiovascular: Normal rate, regular rhythm and normal heart sounds.  Exam reveals no gallop.   No murmur heard. Feet cool--with faint pulses  Pulmonary/Chest: Effort normal and breath sounds normal. No respiratory distress. She has no wheezes. She has no rales.  Abdominal: Soft. There is no tenderness.  Musculoskeletal: She exhibits no edema or tenderness.  Lymphadenopathy:    She has no cervical adenopathy.  Neurological:  Marked decreased sensation in feet  Skin:  No foot lesions  Psychiatric: She has a normal mood and affect. Her behavior is normal.          Assessment & Plan:

## 2016-05-04 NOTE — Assessment & Plan Note (Signed)
See social history 

## 2016-05-04 NOTE — Assessment & Plan Note (Signed)
Doing okay Still has easy DOE but no sig cough/wheeze

## 2016-07-27 ENCOUNTER — Other Ambulatory Visit: Payer: Self-pay | Admitting: Internal Medicine

## 2016-10-13 ENCOUNTER — Other Ambulatory Visit: Payer: Self-pay | Admitting: Internal Medicine

## 2016-11-09 ENCOUNTER — Encounter: Payer: Self-pay | Admitting: Family Medicine

## 2016-11-09 ENCOUNTER — Telehealth: Payer: Self-pay | Admitting: Internal Medicine

## 2016-11-09 ENCOUNTER — Ambulatory Visit (INDEPENDENT_AMBULATORY_CARE_PROVIDER_SITE_OTHER): Payer: Medicare Other | Admitting: Family Medicine

## 2016-11-09 VITALS — BP 98/40 | HR 94 | Temp 97.6°F | Ht 61.75 in | Wt 118.8 lb

## 2016-11-09 DIAGNOSIS — E1140 Type 2 diabetes mellitus with diabetic neuropathy, unspecified: Secondary | ICD-10-CM | POA: Diagnosis not present

## 2016-11-09 DIAGNOSIS — I959 Hypotension, unspecified: Secondary | ICD-10-CM | POA: Diagnosis not present

## 2016-11-09 DIAGNOSIS — L03116 Cellulitis of left lower limb: Secondary | ICD-10-CM

## 2016-11-09 LAB — CBC WITH DIFFERENTIAL/PLATELET
Basophils Absolute: 0 10*3/uL (ref 0.0–0.1)
Basophils Relative: 0.3 % (ref 0.0–3.0)
EOS ABS: 0.1 10*3/uL (ref 0.0–0.7)
EOS PCT: 0.3 % (ref 0.0–5.0)
HCT: 39.7 % (ref 36.0–46.0)
Hemoglobin: 12.7 g/dL (ref 12.0–15.0)
LYMPHS ABS: 3 10*3/uL (ref 0.7–4.0)
Lymphocytes Relative: 18.4 % (ref 12.0–46.0)
MCHC: 32 g/dL (ref 30.0–36.0)
MCV: 90.2 fl (ref 78.0–100.0)
MONO ABS: 0.8 10*3/uL (ref 0.1–1.0)
Monocytes Relative: 4.8 % (ref 3.0–12.0)
NEUTROS PCT: 76.2 % (ref 43.0–77.0)
Neutro Abs: 12.4 10*3/uL — ABNORMAL HIGH (ref 1.4–7.7)
Platelets: 273 10*3/uL (ref 150.0–400.0)
RBC: 4.4 Mil/uL (ref 3.87–5.11)
RDW: 15.5 % (ref 11.5–15.5)
WBC: 16.3 10*3/uL — ABNORMAL HIGH (ref 4.0–10.5)

## 2016-11-09 LAB — COMPREHENSIVE METABOLIC PANEL
ALBUMIN: 3.6 g/dL (ref 3.5–5.2)
ALT: 11 U/L (ref 0–35)
AST: 13 U/L (ref 0–37)
Alkaline Phosphatase: 52 U/L (ref 39–117)
BILIRUBIN TOTAL: 0.6 mg/dL (ref 0.2–1.2)
BUN: 20 mg/dL (ref 6–23)
CALCIUM: 9.2 mg/dL (ref 8.4–10.5)
CO2: 29 mEq/L (ref 19–32)
CREATININE: 1.58 mg/dL — AB (ref 0.40–1.20)
Chloride: 99 mEq/L (ref 96–112)
GFR: 34.05 mL/min — ABNORMAL LOW (ref >60.00)
Glucose, Bld: 182 mg/dL — ABNORMAL HIGH (ref 70–99)
Potassium: 4.4 mEq/L (ref 3.5–5.1)
SODIUM: 136 meq/L (ref 135–145)
TOTAL PROTEIN: 6.3 g/dL (ref 6.0–8.3)

## 2016-11-09 MED ORDER — CEFTRIAXONE SODIUM 1 G IJ SOLR
1.0000 g | Freq: Once | INTRAMUSCULAR | Status: AC
  Administered 2016-11-09: 1 g via INTRAMUSCULAR

## 2016-11-09 MED ORDER — DOXYCYCLINE HYCLATE 100 MG PO TABS: 100.0000 mg | ORAL_TABLET | Freq: Two times a day (BID) | ORAL | 0 refills | Status: DC

## 2016-11-09 NOTE — Telephone Encounter (Signed)
Pt had appt to see Dr Diona Browner today at 11:15.

## 2016-11-09 NOTE — Progress Notes (Signed)
   Subjective:    Patient ID: [name], female    DOB: 1945/05/29, 71 y.o.   MRN: [id number]  HPI   71 year old female pt of Dr. Silvio Pate with history of DM presents for new onset rash on left leg following cat scratch.  She was scratched by her cat 1 week, she wanted to eat.. uptodate with vaccines.  Yesterday she noted redness on left leg, spreading up leg.. Hot, mildly painful.  Yesterday she had chills, shaking, no measured fever.  No N/V, keeping up with fluids.  She has already had 12 oz fluids today.   She feels mildly dizzy, weak.   She place antibiotic ointment on scratches.. No relief.     Lab Results  Component Value Date   HGBA1C 6.8 (H) 05/04/2016    BP Readings from Last 3 Encounters:  11/09/16 (!) 98/40  05/04/16 106/64  11/06/15 120/70   No recent hospitalization, no recent surgery  no known MRSA exposure No recent antibiotics. She has allergy to  PCN but has tolerated keflex in past.   Initially BP 83/42 Blood pressure (!) 98/40, pulse 94, temperature 97.6 F (36.4 C), temperature source Oral, height 5' 1.75" (1.568 m), weight 118 lb 12 oz (53.9 kg).  Review of Systems     Objective:   Physical Exam  Constitutional: Vital signs are normal. She appears well-developed and well-nourished. She is cooperative.  Non-toxic appearance. She does not appear ill. No distress.  HENT:  Head: Normocephalic.  Right Ear: Hearing, tympanic membrane, external ear and ear canal normal. Tympanic membrane is not erythematous, not retracted and not bulging.  Left Ear: Hearing, tympanic membrane, external ear and ear canal normal. Tympanic membrane is not erythematous, not retracted and not bulging.  Nose: No mucosal edema or rhinorrhea. Right sinus exhibits no maxillary sinus tenderness and no frontal sinus tenderness. Left sinus exhibits no maxillary sinus tenderness and no frontal sinus tenderness.  Mouth/Throat: Uvula is midline, oropharynx is clear and moist and  mucous membranes are normal.  Eyes: Pupils are equal, round, and reactive to light. Conjunctivae, EOM and lids are normal. Lids are everted and swept, no foreign bodies found.  Neck: Trachea normal and normal range of motion. Neck supple. Carotid bruit is not present. No thyroid mass and no thyromegaly present.  Cardiovascular: Normal rate, regular rhythm, S1 normal, S2 normal, normal heart sounds, intact distal pulses and normal pulses.  Exam reveals no gallop and no friction rub.   No murmur heard. Pulmonary/Chest: Effort normal and breath sounds normal. No tachypnea. No respiratory distress. She has no decreased breath sounds. She has no wheezes. She has no rhonchi. She has no rales.  Abdominal: Soft. Normal appearance and bowel sounds are normal. There is no tenderness.  Neurological: She is alert.  Skin: Skin is warm, dry and intact. No rash noted.     Left leg with erythema streaking above left leg, swelling and warmth.  Marked with permenant marker  Psychiatric: Her speech is normal and behavior is normal. Judgment and thought content normal. Her mood appears not anxious. Cognition and memory are normal. She does not exhibit a depressed mood.          Assessment & Plan:

## 2016-11-09 NOTE — Telephone Encounter (Signed)
[facility]  Patient Name: [name]  DOB: Jan 11, 1946    Initial Comment Caller states she may have a infection in her leg, red from knee to ankle. Leg pain, swelling.   Nurse Assessment  Nurse: Wynetta Emery, RN, Baker Janus Date/Time Eilene Ghazi Time): 11/09/2016 9:30:54 AM  Confirm and document reason for call. If symptomatic, describe symptoms. ---[name] has possible infection in her left leg, red, warm to touch swollen, from knee to ankle. Also, leg is very painful. onset yesterday  Does the patient have any new or worsening symptoms? ---Yes  Will a triage be completed? ---Yes  Related visit to physician within the last 2 weeks? ---No  Does the PT have any chronic conditions? (i.e. diabetes, asthma, etc.) ---Unknown  Is this a behavioral health or substance abuse call? ---No     Guidelines    Guideline Title Affirmed Question Affirmed Notes  Leg Swelling and Edema [1] Red area or streak [2] large (> 2 in. or 5 cm)    Final Disposition User   See Physician within 4 Hours (or PCP triage) Wynetta Emery, RN, Baker Janus    Comments  NOTE Appointment not available with PCP given 1115 appt with Eliezer Lofts, MD 11/09/2016 at [facility]   Disagree/Comply: Leta Baptist

## 2016-11-09 NOTE — Patient Instructions (Addendum)
We will call with lab results. Start doxycycline 100 mg twice daily in addition to antibiotic injection. Start as soon as possible.  Push fluids, follow blood pressure at home.  Go to ER if redness spreading beyond line of marker, fever or not keeping down oral antibiotics.  Follow up tommorrow with Dr. Silvio Pate.   Cellulitis, Adult Cellulitis is a skin infection. The infected area is usually red and tender. This condition occurs most often in the arms and lower legs. The infection can travel to the muscles, blood, and underlying tissue and become serious. It is very important to get treated for this condition. What are the causes? Cellulitis is caused by bacteria. The bacteria enter through a break in the skin, such as a cut, burn, insect bite, open sore, or crack. What increases the risk? This condition is more likely to occur in people who:  Have a weak defense system (immune system).  Have open wounds on the skin such as cuts, burns, bites, and scrapes. Bacteria can enter the body through these open wounds.  Are older.  Have diabetes.  Have a type of long-lasting (chronic) liver disease (cirrhosis) or kidney disease.  Use IV drugs.  What are the signs or symptoms? Symptoms of this condition include:  Redness, streaking, or spotting on the skin.  Swollen area of the skin.  Tenderness or pain when an area of the skin is touched.  Warm skin.  Fever.  Chills.  Blisters.  How is this diagnosed? This condition is diagnosed based on a medical history and physical exam. You may also have tests, including:  Blood tests.  Lab tests.  Imaging tests.  How is this treated? Treatment for this condition may include:  Medicines, such as antibiotic medicines or antihistamines.  Supportive care, such as rest and application of cold or warm cloths (cold or warm compresses) to the skin.  Hospital care, if the condition is severe.  The infection usually gets better  within 1-2 days of treatment. Follow these instructions at home:  Take over-the-counter and prescription medicines only as told by your health care provider.  If you were prescribed an antibiotic medicine, take it as told by your health care provider. Do not stop taking the antibiotic even if you start to feel better.  Drink enough fluid to keep your urine clear or pale yellow.  Do not touch or rub the infected area.  Raise (elevate) the infected area above the level of your heart while you are sitting or lying down.  Apply warm or cold compresses to the affected area as told by your health care provider.  Keep all follow-up visits as told by your health care provider. This is important. These visits let your health care provider make sure a more serious infection is not developing. Contact a health care provider if:  You have a fever.  Your symptoms do not improve within 1-2 days of starting treatment.  Your bone or joint underneath the infected area becomes painful after the skin has healed.  Your infection returns in the same area or another area.  You notice a swollen bump in the infected area.  You develop new symptoms.  You have a general ill feeling (malaise) with muscle aches and pains. Get help right away if:  Your symptoms get worse.  You feel very sleepy.  You develop vomiting or diarrhea that persists.  You notice red streaks coming from the infected area.  Your red area gets larger or turns dark  in color. This information is not intended to replace advice given to you by your health care provider. Make sure you discuss any questions you have with your health care provider. Document Released: 12/02/2004 Document Revised: 07/03/2015 Document Reviewed: 01/01/2015 Elsevier Interactive Patient Education  2017 [employer].

## 2016-11-09 NOTE — Addendum Note (Signed)
Addended by: Carter Kitten on: 11/09/2016 12:24 PM   Modules accepted: Orders

## 2016-11-09 NOTE — Assessment & Plan Note (Signed)
Treat with parenteral antibiotics given rapid spread in diabetic...ceftriaxone IM.Marland Kitchen Will add doxycycline for pasturella coverage ( given cat scratch), as well as MRSA coverage.  She does not appear toxic, but is slightly hypotensive, mildly tachycardic, no fever in office... Will eval with labs, blood cultures and have close follow up in 24 hours with PCP. Recommended pushing fluid and husband voices understanding of strict return/ED parameters.  PCP has been notified.

## 2016-11-10 ENCOUNTER — Ambulatory Visit (INDEPENDENT_AMBULATORY_CARE_PROVIDER_SITE_OTHER): Payer: Medicare Other | Admitting: Internal Medicine

## 2016-11-10 ENCOUNTER — Encounter: Payer: Self-pay | Admitting: Internal Medicine

## 2016-11-10 VITALS — BP 110/70 | HR 84 | Temp 97.4°F | Wt 120.0 lb

## 2016-11-10 DIAGNOSIS — L03116 Cellulitis of left lower limb: Secondary | ICD-10-CM | POA: Diagnosis not present

## 2016-11-10 MED ORDER — CEFTRIAXONE SODIUM 1 G IJ SOLR
1.0000 g | Freq: Once | INTRAMUSCULAR | Status: AC
  Administered 2016-11-10: 1 g via INTRAMUSCULAR

## 2016-11-10 NOTE — Addendum Note (Signed)
Addended by: Pilar Grammes on: 11/10/2016 12:54 PM   Modules accepted: Orders

## 2016-11-10 NOTE — Assessment & Plan Note (Signed)
Marked improvement Unlikely to be MRSA but will finish out the doxy Rocephin again today to consolidate improvement Then follow up prn

## 2016-11-10 NOTE — Progress Notes (Signed)
Subjective:    Patient ID: [name], female    DOB: 1945-11-23, 71 y.o.   MRN: [id number]  HPI Here for follow up of cellulitis With husband  Did start the doxy--no side effects Feels okay No fever No chills or sweats Still fairly painful leg Redness is some better  Current Outpatient Prescriptions on File Prior to Visit  Medication Sig Dispense Refill  . albuterol (PROVENTIL HFA;VENTOLIN HFA) 108 (90 Base) MCG/ACT inhaler Inhale 2 puffs into the lungs every 6 (six) hours as needed.    Marland Kitchen alendronate (FOSAMAX) 70 MG tablet TK 1 T PO Q 7 DAYS  11  . Cholecalciferol (VITAMIN D3) 5000 UNITS CAPS Take 1 capsule by mouth every morning.    . citalopram (CELEXA) 20 MG tablet TAKE 1 TABLET DAILY 90 tablet 2  . doxycycline (VIBRA-TABS) 100 MG tablet Take 1 tablet (100 mg total) by mouth 2 (two) times daily. 20 tablet 0  . fexofenadine (ALLEGRA) 180 MG tablet Take 180 mg by mouth daily as needed (nasal drainage).    . fluticasone (FLONASE) 50 MCG/ACT nasal spray USE 2 SPRAYS NASALLY DAILY 48 g 3  . glucose blood (FREESTYLE TEST STRIPS) test strip Use as instructed 100 each 12  . LYRICA 75 MG capsule Take 75 mg by mouth 2 (two) times daily.     . metformin (FORTAMET) 1000 MG (OSM) 24 hr tablet TAKE 1 TABLET TWICE A DAY 180 tablet 1  . metoCLOPramide (REGLAN) 5 MG tablet Take 1 tablet by mouth 4 (four) times daily as needed.    . mometasone-formoterol (DULERA) 200-5 MCG/ACT AERO Take 2 puffs first thing in am and then another 2 puffs about 12 hours later. 3 Inhaler 3  . montelukast (SINGULAIR) 10 MG tablet Take 1 tablet (10 mg total) by mouth at bedtime. 90 tablet 3  . Multiple Vitamin (MULTIVITAMIN) tablet Take 1 tablet by mouth every morning.     . Omega-3 Fatty Acids (FISH OIL) 1200 MG CAPS Take 1 capsule by mouth every morning. (place in the freezer)    . polyethylene glycol powder (GLYCOLAX/MIRALAX) powder Take 17 grams (1 capful) every morning and 9 grams (1/2 capful) every evening  850 g 10  . pravastatin (PRAVACHOL) 40 MG tablet TAKE 1 TABLET DAILY 90 tablet 2  . silver sulfADIAZINE (SILVADENE) 1 % cream Apply 1 application topically daily. 50 g 0  . SPIRIVA HANDIHALER 18 MCG inhalation capsule INHALE THE CONTENTS OF 1   CAPSULE VIA HANDIHALER     DAILY 90 capsule 3   No current facility-administered medications on file prior to visit.     Allergies  Allergen Reactions  . Budesonide-Formoterol Fumarate Other (See Comments)    REACTION: coughed worse  . Lipitor [Atorvastatin] Other (See Comments)    Nosebleeds  . Penicillins Rash    REACTION: rash    Past Medical History:  Diagnosis Date  . Anxiety   . Asthma   . Breast cancer [facility])    h/o- radiation  . Carcinoid tumor of lung 1991  . COPD (chronic obstructive pulmonary disease) ([place])   . Depression   . Diabetes mellitus type II   . Diverticulosis   . Gastroparesis   . GERD (gastroesophageal reflux disease)   . History of adenomatous polyp of colon   . History of breast cancer   . Hyperlipidemia   . Mild cognitive impairment   . Osteoarthritis   . Pulmonary nodule    multiple  . Renal cell carcinoma  Past Surgical History:  Procedure Laterality Date  . BREAST LUMPECTOMY  1998   right breast  . CHOLECYSTECTOMY  1975  . LAPAROSCOPIC NEPHRECTOMY  01/2009   right  . LUNG SURGERY  1994   carcinoid removal  . TONSILLECTOMY AND ADENOIDECTOMY  1953    Family History  Problem Relation Age of Onset  . Colon cancer Father   . Lung cancer Father   . Stroke Mother   . Breast cancer Neg Hx   . Ovarian cancer Neg Hx   . Diabetes Neg Hx     Social History   Social History  . Marital status: Married    Spouse name: N/A  . Number of children: 2  . Years of education: N/A   Occupational History  . retired Science writer Retired   Social History Main Topics  . Smoking status: Former Smoker    Packs/day: 1.00    Years: 30.00    Types: Cigarettes    Quit date: 03/08/1993  . Smokeless tobacco:  Never Used  . Alcohol use No  . Drug use: No  . Sexual activity: Not Currently    Birth control/ protection: Post-menopausal   Other Topics Concern  . Not on file   Social History Narrative   No living will   Requests husband as health care POA   Would accept attempts at resuscitation   No tube feeds if cognitively unaware               Review of Systems No vomiting or diarrhea Appetite is okay    Objective:   Physical Exam  Constitutional: No distress.  Skin:  Marked improvement since yesterday Redness has receded considerably from border markings Still some warmth at center of redness and mild tenderness          Assessment & Plan:

## 2016-11-17 LAB — CULTURE, BLOOD (SINGLE)
Organism ID, Bacteria: NO GROWTH
Organism ID, Bacteria: NO GROWTH

## 2016-11-24 ENCOUNTER — Ambulatory Visit (INDEPENDENT_AMBULATORY_CARE_PROVIDER_SITE_OTHER): Payer: Medicare Other | Admitting: Primary Care

## 2016-11-24 ENCOUNTER — Encounter: Payer: Self-pay | Admitting: Primary Care

## 2016-11-24 DIAGNOSIS — L03116 Cellulitis of left lower limb: Secondary | ICD-10-CM

## 2016-11-24 NOTE — Patient Instructions (Signed)
Your infection is gone.  Elevate your legs when resting to reduce swelling.  Please notify us if the redness returns, you start running fevers, you feel worse.  It was a pleasure meeting you!

## 2016-11-24 NOTE — Progress Notes (Signed)
Subjective:    Patient ID: [name], female    DOB: 03-Jul-1945, 71 y.o.   MRN: [id number]  HPI  [name] is a 71 year old female with a history of type 2 diabetes who presents today for follow up of cellulitis. She presented on 11/09/16 with a chief complaint of erythema and pain to her anterior left lower extremity one week after being scratched by her cat. She was treated by Dr. Diona Browner with IM ceftriaxone and oral doxycycline course during that visit and was told to follow up. She followed up with her PCP one day later with marked improvement of erythema and pain, provided with another IM dose of ceftriaxone, continued Doxycycline.  Since her last visit she's noticed improvement in erythema and pain but continues to notice swelling with some tenderness. Her swelling is located to the left lower extremity from her calf to the ankle. Her pain has improved, but still tender. She denies fevers, chills, drainage, long travel. She completed her Doxycycline one week ago.   Review of Systems  Constitutional: Negative for fever.  Cardiovascular: Positive for leg swelling.  Skin: Negative for color change.       Past Medical History:  Diagnosis Date  . Anxiety   . Asthma   . Breast cancer [facility])    h/o- radiation  . Carcinoid tumor of lung 1991  . COPD (chronic obstructive pulmonary disease) ([place])   . Depression   . Diabetes mellitus type II   . Diverticulosis   . Gastroparesis   . GERD (gastroesophageal reflux disease)   . History of adenomatous polyp of colon   . History of breast cancer   . Hyperlipidemia   . Mild cognitive impairment   . Osteoarthritis   . Pulmonary nodule    multiple  . Renal cell carcinoma      Social History   Social History  . Marital status: Married    Spouse name: N/A  . Number of children: 2  . Years of education: N/A   Occupational History  . retired Science writer Retired   Social History Main Topics  . Smoking status: Former Smoker   Packs/day: 1.00    Years: 30.00    Types: Cigarettes    Quit date: 03/08/1993  . Smokeless tobacco: Never Used  . Alcohol use No  . Drug use: No  . Sexual activity: Not Currently    Birth control/ protection: Post-menopausal   Other Topics Concern  . Not on file   Social History Narrative   No living will   Requests husband as health care POA   Would accept attempts at resuscitation   No tube feeds if cognitively unaware             Past Surgical History:  Procedure Laterality Date  . BREAST LUMPECTOMY  1998   right breast  . CHOLECYSTECTOMY  1975  . LAPAROSCOPIC NEPHRECTOMY  01/2009   right  . LUNG SURGERY  1994   carcinoid removal  . TONSILLECTOMY AND ADENOIDECTOMY  1953    Family History  Problem Relation Age of Onset  . Colon cancer Father   . Lung cancer Father   . Stroke Mother   . Breast cancer Neg Hx   . Ovarian cancer Neg Hx   . Diabetes Neg Hx     Allergies  Allergen Reactions  . Budesonide-Formoterol Fumarate Other (See Comments)    REACTION: coughed worse  . Lipitor [Atorvastatin] Other (See Comments)    Nosebleeds  .  Penicillins Rash    REACTION: rash    Current Outpatient Prescriptions on File Prior to Visit  Medication Sig Dispense Refill  . albuterol (PROVENTIL HFA;VENTOLIN HFA) 108 (90 Base) MCG/ACT inhaler Inhale 2 puffs into the lungs every 6 (six) hours as needed.    Marland Kitchen alendronate (FOSAMAX) 70 MG tablet TK 1 T PO Q 7 DAYS  11  . Cholecalciferol (VITAMIN D3) 5000 UNITS CAPS Take 1 capsule by mouth every morning.    . citalopram (CELEXA) 20 MG tablet TAKE 1 TABLET DAILY 90 tablet 2  . doxycycline (VIBRA-TABS) 100 MG tablet Take 1 tablet (100 mg total) by mouth 2 (two) times daily. 20 tablet 0  . fexofenadine (ALLEGRA) 180 MG tablet Take 180 mg by mouth daily as needed (nasal drainage).    . fluticasone (FLONASE) 50 MCG/ACT nasal spray USE 2 SPRAYS NASALLY DAILY 48 g 3  . glucose blood (FREESTYLE TEST STRIPS) test strip Use as instructed  100 each 12  . LYRICA 75 MG capsule Take 75 mg by mouth 2 (two) times daily.     . metformin (FORTAMET) 1000 MG (OSM) 24 hr tablet TAKE 1 TABLET TWICE A DAY 180 tablet 1  . metoCLOPramide (REGLAN) 5 MG tablet Take 1 tablet by mouth 4 (four) times daily as needed.    . mometasone-formoterol (DULERA) 200-5 MCG/ACT AERO Take 2 puffs first thing in am and then another 2 puffs about 12 hours later. 3 Inhaler 3  . montelukast (SINGULAIR) 10 MG tablet Take 1 tablet (10 mg total) by mouth at bedtime. 90 tablet 3  . Multiple Vitamin (MULTIVITAMIN) tablet Take 1 tablet by mouth every morning.     . Omega-3 Fatty Acids (FISH OIL) 1200 MG CAPS Take 1 capsule by mouth every morning. (place in the freezer)    . polyethylene glycol powder (GLYCOLAX/MIRALAX) powder Take 17 grams (1 capful) every morning and 9 grams (1/2 capful) every evening 850 g 10  . pravastatin (PRAVACHOL) 40 MG tablet TAKE 1 TABLET DAILY 90 tablet 2  . silver sulfADIAZINE (SILVADENE) 1 % cream Apply 1 application topically daily. 50 g 0  . SPIRIVA HANDIHALER 18 MCG inhalation capsule INHALE THE CONTENTS OF 1   CAPSULE VIA HANDIHALER     DAILY 90 capsule 3   No current facility-administered medications on file prior to visit.     BP (!) 118/54   Pulse 80   Temp 97.9 F (36.6 C) (Oral)   Wt 120 lb 15.7 oz (54.9 kg)   SpO2 93%   BMI 22.31 kg/m    Objective:   Physical Exam  Constitutional: She appears well-nourished.  Neck: Neck supple.  Cardiovascular:  Trace pitting edema to left anterior lower extremity  Skin: Skin is warm and dry.  Two abrasions that appear to be healing. Tenderness to left calf and anterior lower extremity.          Assessment & Plan:

## 2016-11-24 NOTE — Assessment & Plan Note (Signed)
Cellulitis resolved. Suspect tenderness to be secondary to mild edema that has developed. Discussed to elevate extremities when at rest. She will continue to monitor the site and report any new erythema, pain, drainage.

## 2016-12-04 ENCOUNTER — Encounter: Payer: Self-pay | Admitting: Internal Medicine

## 2016-12-07 MED ORDER — MOMETASONE FURO-FORMOTEROL FUM 200-5 MCG/ACT IN AERO: INHALATION_SPRAY | RESPIRATORY_TRACT | 3 refills | Status: DC

## 2016-12-09 ENCOUNTER — Other Ambulatory Visit: Payer: Self-pay | Admitting: Internal Medicine

## 2016-12-09 DIAGNOSIS — Z1231 Encounter for screening mammogram for malignant neoplasm of breast: Secondary | ICD-10-CM

## 2016-12-27 ENCOUNTER — Encounter: Payer: Self-pay | Admitting: Internal Medicine

## 2016-12-27 DIAGNOSIS — E1142 Type 2 diabetes mellitus with diabetic polyneuropathy: Secondary | ICD-10-CM

## 2016-12-28 ENCOUNTER — Ambulatory Visit
Admission: RE | Admit: 2016-12-28 | Discharge: 2016-12-28 | Disposition: A | Discharge status: Home / Self Care | Payer: Medicare Other | Source: Ambulatory Visit | Attending: Internal Medicine | Admitting: Internal Medicine

## 2016-12-28 DIAGNOSIS — Z1231 Encounter for screening mammogram for malignant neoplasm of breast: Secondary | ICD-10-CM | POA: Diagnosis not present

## 2017-01-26 ENCOUNTER — Ambulatory Visit: Payer: Self-pay

## 2017-01-26 ENCOUNTER — Encounter: Payer: Self-pay | Admitting: Family Medicine

## 2017-01-26 ENCOUNTER — Ambulatory Visit (INDEPENDENT_AMBULATORY_CARE_PROVIDER_SITE_OTHER): Payer: Medicare Other | Admitting: Family Medicine

## 2017-01-26 VITALS — BP 118/66 | HR 64 | Temp 98.0°F | Wt 120.0 lb

## 2017-01-26 DIAGNOSIS — E1122 Type 2 diabetes mellitus with diabetic chronic kidney disease: Secondary | ICD-10-CM | POA: Diagnosis not present

## 2017-01-26 DIAGNOSIS — L03115 Cellulitis of right lower limb: Secondary | ICD-10-CM

## 2017-01-26 DIAGNOSIS — E1140 Type 2 diabetes mellitus with diabetic neuropathy, unspecified: Secondary | ICD-10-CM

## 2017-01-26 DIAGNOSIS — N183 Chronic kidney disease, stage 3 (moderate): Secondary | ICD-10-CM

## 2017-01-26 LAB — RENAL FUNCTION PANEL
ALBUMIN: 3.7 g/dL (ref 3.5–5.2)
BUN: 17 mg/dL (ref 6–23)
CALCIUM: 9.2 mg/dL (ref 8.4–10.5)
CO2: 29 mEq/L (ref 19–32)
Chloride: 103 mEq/L (ref 96–112)
Creatinine, Ser: 1.38 mg/dL — ABNORMAL HIGH (ref 0.40–1.20)
GFR: 39.78 mL/min — ABNORMAL LOW (ref >60.00)
GLUCOSE: 166 mg/dL — AB (ref 70–99)
Phosphorus: 3.1 mg/dL (ref 2.3–4.6)
Potassium: 4 mEq/L (ref 3.5–5.1)
SODIUM: 141 meq/L (ref 135–145)

## 2017-01-26 LAB — CBC WITH DIFFERENTIAL/PLATELET
Basophils Absolute: 0 10*3/uL (ref 0.0–0.1)
Basophils Relative: 0.3 % (ref 0.0–3.0)
EOS PCT: 0.6 % (ref 0.0–5.0)
Eosinophils Absolute: 0.1 10*3/uL (ref 0.0–0.7)
HCT: 38.9 % (ref 36.0–46.0)
Hemoglobin: 12.5 g/dL (ref 12.0–15.0)
Lymphocytes Relative: 20.7 % (ref 12.0–46.0)
Lymphs Abs: 2.6 10*3/uL (ref 0.7–4.0)
MCHC: 32.2 g/dL (ref 30.0–36.0)
MCV: 90.8 fl (ref 78.0–100.0)
MONO ABS: 0.8 10*3/uL (ref 0.1–1.0)
MONOS PCT: 6 % (ref 3.0–12.0)
NEUTROS ABS: 9 10*3/uL — AB (ref 1.4–7.7)
NEUTROS PCT: 72.4 % (ref 43.0–77.0)
Platelets: 248 10*3/uL (ref 150.0–400.0)
RBC: 4.28 Mil/uL (ref 3.87–5.11)
RDW: 15 % (ref 11.5–15.5)
WBC: 12.5 10*3/uL — ABNORMAL HIGH (ref 4.0–10.5)

## 2017-01-26 LAB — HEMOGLOBIN A1C: Hgb A1c MFr Bld: 6.4 % (ref 4.6–6.5)

## 2017-01-26 MED ORDER — DOXYCYCLINE HYCLATE 100 MG PO TABS: 100.0000 mg | ORAL_TABLET | Freq: Two times a day (BID) | ORAL | 0 refills | Status: DC

## 2017-01-26 MED ORDER — CEFTRIAXONE SODIUM 1 G IJ SOLR
1.0000 g | Freq: Once | INTRAMUSCULAR | Status: AC
  Administered 2017-01-26: 1 g via INTRAMUSCULAR

## 2017-01-26 NOTE — Progress Notes (Signed)
BP 118/66 (BP Location: Left Arm, Patient Position: Sitting, Cuff Size: Normal)   Pulse 64   Temp 98 F (36.7 C) (Oral)   Wt 120 lb (54.4 kg)   BMI 22.13 kg/m    CC: RLE swelling Subjective:    Patient ID: [name], female    DOB: 1945/05/25, 71 y.o.   MRN: [id number]  HPI: [name] is a 71 y.o. female presenting on 01/26/2017 for Leg Swelling (in right LE.  Also c/o redness and warm to touch for 2 days, worsening. Has not taken anything)   Here with husband today.   2d h/o RLE pain, swelling, redness and warmth. Denies breaks in skin. No injury or falls.   Recent LLE cellulitis treated with IM rocephin and doxycycline 11/2016.  Prior to this did not have recurrent cellulitis.   Known diabetic but pt endorses good control. Doesn't remember recent checks. Planning to establish with Dr Gabriel Carina 03/2017. H/o multiple cancers - renal, lung, breast - all have been treated.  Lab Results  Component Value Date   HGBA1C 6.8 (H) 05/04/2016    Relevant past medical, surgical, family and social history reviewed and updated as indicated. Interim medical history since our last visit reviewed. Allergies and medications reviewed and updated. Outpatient Medications Prior to Visit  Medication Sig Dispense Refill  . albuterol (PROVENTIL HFA;VENTOLIN HFA) 108 (90 Base) MCG/ACT inhaler Inhale 2 puffs into the lungs every 6 (six) hours as needed.    Marland Kitchen alendronate (FOSAMAX) 70 MG tablet TK 1 T PO Q 7 DAYS  11  . Cholecalciferol (VITAMIN D3) 5000 UNITS CAPS Take 1 capsule by mouth every morning.    . citalopram (CELEXA) 20 MG tablet TAKE 1 TABLET DAILY 90 tablet 2  . fexofenadine (ALLEGRA) 180 MG tablet Take 180 mg by mouth daily as needed (nasal drainage).    . fluticasone (FLONASE) 50 MCG/ACT nasal spray USE 2 SPRAYS NASALLY DAILY 48 g 3  . glucose blood (FREESTYLE TEST STRIPS) test strip Use as instructed 100 each 12  . LYRICA 75 MG capsule Take 75 mg by mouth 2 (two) times daily.      . metformin (FORTAMET) 1000 MG (OSM) 24 hr tablet TAKE 1 TABLET TWICE A DAY 180 tablet 1  . metoCLOPramide (REGLAN) 5 MG tablet Take 1 tablet by mouth 4 (four) times daily as needed.    . mometasone-formoterol (DULERA) 200-5 MCG/ACT AERO Take 2 puffs first thing in am and then another 2 puffs about 12 hours later. 3 Inhaler 3  . montelukast (SINGULAIR) 10 MG tablet Take 1 tablet (10 mg total) by mouth at bedtime. 90 tablet 3  . Multiple Vitamin (MULTIVITAMIN) tablet Take 1 tablet by mouth every morning.     . Omega-3 Fatty Acids (FISH OIL) 1200 MG CAPS Take 1 capsule by mouth every morning. (place in the freezer)    . polyethylene glycol powder (GLYCOLAX/MIRALAX) powder Take 17 grams (1 capful) every morning and 9 grams (1/2 capful) every evening 850 g 10  . pravastatin (PRAVACHOL) 40 MG tablet TAKE 1 TABLET DAILY 90 tablet 2  . silver sulfADIAZINE (SILVADENE) 1 % cream Apply 1 application topically daily. 50 g 0  . SPIRIVA HANDIHALER 18 MCG inhalation capsule INHALE THE CONTENTS OF 1   CAPSULE VIA HANDIHALER     DAILY 90 capsule 3  . doxycycline (VIBRA-TABS) 100 MG tablet Take 1 tablet (100 mg total) by mouth 2 (two) times daily. 20 tablet 0   No facility-administered  medications prior to visit.      Per HPI unless specifically indicated in ROS section below Review of Systems     Objective:    BP 118/66 (BP Location: Left Arm, Patient Position: Sitting, Cuff Size: Normal)   Pulse 64   Temp 98 F (36.7 C) (Oral)   Wt 120 lb (54.4 kg)   BMI 22.13 kg/m   Wt Readings from Last 3 Encounters:  01/26/17 120 lb (54.4 kg)  11/24/16 120 lb 15.7 oz (54.9 kg)  11/10/16 120 lb (54.4 kg)    Physical Exam  Constitutional: She appears well-developed and well-nourished. No distress.  Musculoskeletal: She exhibits edema (1+ RLE).  Erythema swelling, discomfort and warmth to RLE 1+ DP bilaterally Erythema outlined with skin marker  Skin: There is erythema.  Nursing note and vitals  reviewed.  Results for orders placed or performed in visit on 11/09/16  Culture, blood (single)  Result Value Ref Range   Organism ID, Bacteria NO GROWTH 7 DAYS   Culture, blood (single)  Result Value Ref Range   Organism ID, Bacteria NO GROWTH 7 DAYS   CBC with Differential/Platelet  Result Value Ref Range   WBC 16.3 (H) 4.0 - 10.5 K/uL   RBC 4.40 3.87 - 5.11 Mil/uL   Hemoglobin 12.7 12.0 - 15.0 g/dL   HCT 39.7 36.0 - 46.0 %   MCV 90.2 78.0 - 100.0 fl   MCHC 32.0 30.0 - 36.0 g/dL   RDW 15.5 11.5 - 15.5 %   Platelets 273.0 150.0 - 400.0 K/uL   Neutrophils Relative % 76.2 43.0 - 77.0 %   Lymphocytes Relative 18.4 12.0 - 46.0 %   Monocytes Relative 4.8 3.0 - 12.0 %   Eosinophils Relative 0.3 0.0 - 5.0 %   Basophils Relative 0.3 0.0 - 3.0 %   Neutro Abs 12.4 (H) 1.4 - 7.7 K/uL   Lymphs Abs 3.0 0.7 - 4.0 K/uL   Monocytes Absolute 0.8 0.1 - 1.0 K/uL   Eosinophils Absolute 0.1 0.0 - 0.7 K/uL   Basophils Absolute 0.0 0.0 - 0.1 K/uL  Comprehensive metabolic panel  Result Value Ref Range   Sodium 136 135 - 145 mEq/L   Potassium 4.4 3.5 - 5.1 mEq/L   Chloride 99 96 - 112 mEq/L   CO2 29 19 - 32 mEq/L   Glucose, Bld 182 (H) 70 - 99 mg/dL   BUN 20 6 - 23 mg/dL   Creatinine, Ser 1.58 (H) 0.40 - 1.20 mg/dL   Total Bilirubin 0.6 0.2 - 1.2 mg/dL   Alkaline Phosphatase 52 39 - 117 U/L   AST 13 0 - 37 U/L   ALT 11 0 - 35 U/L   Total Protein 6.3 6.0 - 8.3 g/dL   Albumin 3.6 3.5 - 5.2 g/dL   Calcium 9.2 8.4 - 10.5 mg/dL   GFR 34.05 (L) >60.00 mL/min      Assessment & Plan:   Problem List Items Addressed This Visit    Cellulitis of right leg - Primary    New. Treat with rocephin 1gm IM today then start 10d doxy course. Red flags to seek care over holiday weekend reviewed.       Relevant Orders   Renal function panel   CBC with Differential/Platelet   CKD stage 3 due to type 2 diabetes mellitus ([place])    Recheck Cr today - caution with metformin use.       Relevant Orders    Renal function panel   Type 2  diabetes, controlled, with neuropathy ([place])    Update A1c. If poor control, may be contributing to recurrent skin infections. Pending establishing with Dr Gabriel Carina 03/2017.      Relevant Orders   Hemoglobin A1c       Follow up plan: No Follow-up on file.  Ria Bush, MD

## 2017-01-26 NOTE — Addendum Note (Signed)
Addended by: Brenton Grills on: 28/97/9150 01:12 PM   Modules accepted: Orders

## 2017-01-26 NOTE — Telephone Encounter (Signed)
Husband called for the pt.  Reported 2 day hx of redness, warmth, and swelling of left ankle and calf region.  Reported worsening of sx's. since the onset.  Denied any recent injury.  Denied any rash or breakdown in the tissue.  Reported she has had infection in the same leg, in the past.  Denied fever/ chills.  Stated the pt. c/o pain with weight bearing.  Denied previous hx of DVT.   Per protocol, pt. Schedule for an appt. Today at [facility].  Husband agrees with plan.       Reason for Disposition . [1] Red area or streak [2] large (> 2 in. or 5 cm)  Answer Assessment - Initial Assessment Questions 1. LOCATION: "Which joint is swollen?"     Left ankle 2. ONSET: "When did the swelling start?"     2 days ago, but has worsened 3. SIZE: "How large is the swelling?"     moderate 4. PAIN: "Is there any pain?" If so, ask: "How bad is it?" (Scale 1-10; or mild, moderate, severe)     Pain in left ankle ; more painful with weight bearing 5. CAUSE: "What do you think caused the swollen joint?"     unknown 6. OTHER SYMPTOMS: "Do you have any other symptoms?" (e.g., fever, chest pain, difficulty breathing, calf pain)     Calf swelling / pain; reported a hx of infection in left leg in past 7. PREGNANCY: "Is there any chance you are pregnant?" "When was your last menstrual period?"     no  Protocols used: ANKLE SWELLING-A-AH

## 2017-01-26 NOTE — Patient Instructions (Signed)
Blood work today. I think you have another cellulitis, of right leg this time. Treat with rocephin shot today then start doxycycline 10 day course - take with food and caution with sunburn.  Keep leg elevated. May use warm compresses to the leg.  Let us know if not improving, watch for fever >101, worsening swelling or pain or spreading redness past line.   Cellulitis, Adult Cellulitis is a skin infection. The infected area is usually red and tender. This condition occurs most often in the arms and lower legs. The infection can travel to the muscles, blood, and underlying tissue and become serious. It is very important to get treated for this condition. What are the causes? Cellulitis is caused by bacteria. The bacteria enter through a break in the skin, such as a cut, burn, insect bite, open sore, or crack. What increases the risk? This condition is more likely to occur in people who:  Have a weak defense system (immune system).  Have open wounds on the skin such as cuts, burns, bites, and scrapes. Bacteria can enter the body through these open wounds.  Are older.  Have diabetes.  Have a type of long-lasting (chronic) liver disease (cirrhosis) or kidney disease.  Use IV drugs.  What are the signs or symptoms? Symptoms of this condition include:  Redness, streaking, or spotting on the skin.  Swollen area of the skin.  Tenderness or pain when an area of the skin is touched.  Warm skin.  Fever.  Chills.  Blisters.  How is this diagnosed? This condition is diagnosed based on a medical history and physical exam. You may also have tests, including:  Blood tests.  Lab tests.  Imaging tests.  How is this treated? Treatment for this condition may include:  Medicines, such as antibiotic medicines or antihistamines.  Supportive care, such as rest and application of cold or warm cloths (cold or warm compresses) to the skin.  Hospital care, if the condition is  severe.  The infection usually gets better within 1-2 days of treatment. Follow these instructions at home:  Take over-the-counter and prescription medicines only as told by your health care provider.  If you were prescribed an antibiotic medicine, take it as told by your health care provider. Do not stop taking the antibiotic even if you start to feel better.  Drink enough fluid to keep your urine clear or pale yellow.  Do not touch or rub the infected area.  Raise (elevate) the infected area above the level of your heart while you are sitting or lying down.  Apply warm or cold compresses to the affected area as told by your health care provider.  Keep all follow-up visits as told by your health care provider. This is important. These visits let your health care provider make sure a more serious infection is not developing. Contact a health care provider if:  You have a fever.  Your symptoms do not improve within 1-2 days of starting treatment.  Your bone or joint underneath the infected area becomes painful after the skin has healed.  Your infection returns in the same area or another area.  You notice a swollen bump in the infected area.  You develop new symptoms.  You have a general ill feeling (malaise) with muscle aches and pains. Get help right away if:  Your symptoms get worse.  You feel very sleepy.  You develop vomiting or diarrhea that persists.  You notice red streaks coming from the infected area.  Your red area gets larger or turns dark in color. This information is not intended to replace advice given to you by your health care provider. Make sure you discuss any questions you have with your health care provider. Document Released: 12/02/2004 Document Revised: 07/03/2015 Document Reviewed: 01/01/2015 Elsevier Interactive Patient Education  2017 [employer].

## 2017-01-26 NOTE — Assessment & Plan Note (Addendum)
Update A1c. If poor control, may be contributing to recurrent skin infections. Pending establishing with Dr Gabriel Carina 03/2017.

## 2017-01-26 NOTE — Assessment & Plan Note (Signed)
Recheck Cr today - caution with metformin use.

## 2017-01-26 NOTE — Assessment & Plan Note (Signed)
New. Treat with rocephin 1gm IM today then start 10d doxy course. Red flags to seek care over holiday weekend reviewed.

## 2017-03-31 DIAGNOSIS — N183 Chronic kidney disease, stage 3 (moderate): Secondary | ICD-10-CM | POA: Diagnosis not present

## 2017-03-31 DIAGNOSIS — E1142 Type 2 diabetes mellitus with diabetic polyneuropathy: Secondary | ICD-10-CM | POA: Diagnosis not present

## 2017-03-31 DIAGNOSIS — E1122 Type 2 diabetes mellitus with diabetic chronic kidney disease: Secondary | ICD-10-CM | POA: Diagnosis not present

## 2017-04-06 ENCOUNTER — Encounter: Payer: Self-pay | Admitting: Gastroenterology

## 2017-04-19 DIAGNOSIS — J449 Chronic obstructive pulmonary disease, unspecified: Secondary | ICD-10-CM | POA: Diagnosis not present

## 2017-04-19 DIAGNOSIS — E1122 Type 2 diabetes mellitus with diabetic chronic kidney disease: Secondary | ICD-10-CM | POA: Diagnosis not present

## 2017-04-19 DIAGNOSIS — M8580 Other specified disorders of bone density and structure, unspecified site: Secondary | ICD-10-CM | POA: Diagnosis not present

## 2017-04-19 DIAGNOSIS — F0390 Unspecified dementia without behavioral disturbance: Secondary | ICD-10-CM | POA: Diagnosis not present

## 2017-04-19 DIAGNOSIS — F329 Major depressive disorder, single episode, unspecified: Secondary | ICD-10-CM | POA: Diagnosis not present

## 2017-04-19 DIAGNOSIS — Z853 Personal history of malignant neoplasm of breast: Secondary | ICD-10-CM | POA: Diagnosis not present

## 2017-04-19 DIAGNOSIS — R296 Repeated falls: Secondary | ICD-10-CM | POA: Diagnosis not present

## 2017-04-19 DIAGNOSIS — Z79899 Other long term (current) drug therapy: Secondary | ICD-10-CM | POA: Diagnosis not present

## 2017-04-19 DIAGNOSIS — Z7984 Long term (current) use of oral hypoglycemic drugs: Secondary | ICD-10-CM | POA: Diagnosis not present

## 2017-04-19 DIAGNOSIS — Z905 Acquired absence of kidney: Secondary | ICD-10-CM | POA: Diagnosis not present

## 2017-04-19 DIAGNOSIS — N189 Chronic kidney disease, unspecified: Secondary | ICD-10-CM | POA: Diagnosis not present

## 2017-04-19 DIAGNOSIS — Z85528 Personal history of other malignant neoplasm of kidney: Secondary | ICD-10-CM | POA: Diagnosis not present

## 2017-04-19 DIAGNOSIS — Z6821 Body mass index (BMI) 21.0-21.9, adult: Secondary | ICD-10-CM | POA: Diagnosis not present

## 2017-04-19 DIAGNOSIS — Z7951 Long term (current) use of inhaled steroids: Secondary | ICD-10-CM | POA: Diagnosis not present

## 2017-04-19 DIAGNOSIS — Z9181 History of falling: Secondary | ICD-10-CM | POA: Diagnosis not present

## 2017-04-19 DIAGNOSIS — Z974 Presence of external hearing-aid: Secondary | ICD-10-CM | POA: Diagnosis not present

## 2017-04-19 DIAGNOSIS — E1140 Type 2 diabetes mellitus with diabetic neuropathy, unspecified: Secondary | ICD-10-CM | POA: Diagnosis not present

## 2017-04-19 DIAGNOSIS — Z66 Do not resuscitate: Secondary | ICD-10-CM | POA: Diagnosis not present

## 2017-04-19 DIAGNOSIS — Z7983 Long term (current) use of bisphosphonates: Secondary | ICD-10-CM | POA: Diagnosis not present

## 2017-04-19 DIAGNOSIS — R2689 Other abnormalities of gait and mobility: Secondary | ICD-10-CM | POA: Diagnosis not present

## 2017-05-03 DIAGNOSIS — J449 Chronic obstructive pulmonary disease, unspecified: Secondary | ICD-10-CM | POA: Diagnosis not present

## 2017-05-03 DIAGNOSIS — M6281 Muscle weakness (generalized): Secondary | ICD-10-CM | POA: Diagnosis not present

## 2017-05-03 DIAGNOSIS — R269 Unspecified abnormalities of gait and mobility: Secondary | ICD-10-CM | POA: Diagnosis not present

## 2017-05-05 ENCOUNTER — Encounter: Payer: Self-pay | Admitting: General Surgery

## 2017-05-06 ENCOUNTER — Encounter: Payer: Self-pay | Admitting: Internal Medicine

## 2017-05-06 ENCOUNTER — Encounter: Payer: Self-pay | Admitting: General Surgery

## 2017-05-06 ENCOUNTER — Ambulatory Visit (INDEPENDENT_AMBULATORY_CARE_PROVIDER_SITE_OTHER): Payer: Medicare Other | Admitting: Internal Medicine

## 2017-05-06 VITALS — BP 110/72 | HR 84 | Temp 97.3°F | Ht 61.5 in | Wt 124.0 lb

## 2017-05-06 DIAGNOSIS — Z23 Encounter for immunization: Secondary | ICD-10-CM | POA: Diagnosis not present

## 2017-05-06 DIAGNOSIS — E1140 Type 2 diabetes mellitus with diabetic neuropathy, unspecified: Secondary | ICD-10-CM

## 2017-05-06 DIAGNOSIS — Z7189 Other specified counseling: Secondary | ICD-10-CM

## 2017-05-06 DIAGNOSIS — F39 Unspecified mood [affective] disorder: Secondary | ICD-10-CM | POA: Diagnosis not present

## 2017-05-06 DIAGNOSIS — J449 Chronic obstructive pulmonary disease, unspecified: Secondary | ICD-10-CM

## 2017-05-06 DIAGNOSIS — Z0000 Encounter for general adult medical examination without abnormal findings: Secondary | ICD-10-CM | POA: Diagnosis not present

## 2017-05-06 DIAGNOSIS — E1122 Type 2 diabetes mellitus with diabetic chronic kidney disease: Secondary | ICD-10-CM | POA: Diagnosis not present

## 2017-05-06 DIAGNOSIS — F0150 Vascular dementia without behavioral disturbance: Secondary | ICD-10-CM | POA: Diagnosis not present

## 2017-05-06 DIAGNOSIS — N183 Chronic kidney disease, stage 3 (moderate): Secondary | ICD-10-CM | POA: Diagnosis not present

## 2017-05-06 LAB — RENAL FUNCTION PANEL
ALBUMIN: 3.1 g/dL — AB (ref 3.5–5.2)
BUN: 15 mg/dL (ref 6–23)
CHLORIDE: 105 meq/L (ref 96–112)
CO2: 27 mEq/L (ref 19–32)
CREATININE: 1.36 mg/dL — AB (ref 0.40–1.20)
Calcium: 9.6 mg/dL (ref 8.4–10.5)
GFR: 40.43 mL/min — ABNORMAL LOW (ref >60.00)
GLUCOSE: 101 mg/dL — AB (ref 70–99)
PHOSPHORUS: 4.2 mg/dL (ref 2.3–4.6)
Potassium: 5.1 mEq/L (ref 3.5–5.1)
SODIUM: 139 meq/L (ref 135–145)

## 2017-05-06 LAB — LIPID PANEL
CHOLESTEROL: 122 mg/dL (ref 0–200)
HDL: 40.1 mg/dL (ref >39.00)
LDL CALC: 52 mg/dL (ref 0–99)
NONHDL: 81.86
Total CHOL/HDL Ratio: 3
Triglycerides: 147 mg/dL (ref 0.0–149.0)
VLDL: 29.4 mg/dL (ref 0.0–40.0)

## 2017-05-06 LAB — HEMOGLOBIN A1C: Hgb A1c MFr Bld: 7.1 % — ABNORMAL HIGH (ref 4.6–6.5)

## 2017-05-06 MED ORDER — METFORMIN HCL 500 MG PO TABS: 500.0000 mg | ORAL_TABLET | Freq: Two times a day (BID) | ORAL | 3 refills | Status: DC

## 2017-05-06 MED ORDER — CITALOPRAM HYDROBROMIDE 20 MG PO TABS: 10.0000 mg | ORAL_TABLET | Freq: Every day | ORAL | 0 refills | Status: DC

## 2017-05-06 NOTE — Progress Notes (Signed)
Subjective:    Patient ID: [name], female    DOB: 28-Mar-1945, 72 y.o.   MRN: [id number]  HPI Here with husband for Medicare wellness visit and follow up of chronic health conditions Reviewed form and advanced directives Reviewed other doctors No tobacco or alcohol Very little exercise Vision is good Hearing aides--these help   Reviewed recent visit [facility] with Dr Joya Salm Did have recent fall with minor injury Made referral to therapy--will sign the orders  Metformin dose was cut to 1000mg  daily She went back up to the bid because she felt her foot numbness worsened on the lower dose Checks sugars occasionally---not low No diarrhea on this  Known early renal insufficiency BP runs low--so no ACEI/ARB  Ongoing memory issues Discussed giving up the driving---she has not been driving much now (just grocery store) Is going to get testing via [facility] for her driving Independent with ADLs and does housework  Mood has been good  No regular depression or anxiety on the citalopram  No chest pain No SOB Gets some dizziness during allergy season No edema  Only occasional coughing  Current Outpatient Medications on File Prior to Visit  Medication Sig Dispense Refill  . albuterol (PROVENTIL HFA;VENTOLIN HFA) 108 (90 Base) MCG/ACT inhaler Inhale 2 puffs into the lungs every 6 (six) hours as needed.    Marland Kitchen alendronate (FOSAMAX) 70 MG tablet TK 1 T PO Q 7 DAYS  11  . Cholecalciferol (VITAMIN D3) 5000 UNITS CAPS Take 1 capsule by mouth every morning.    . citalopram (CELEXA) 20 MG tablet TAKE 1 TABLET DAILY 90 tablet 2  . fexofenadine (ALLEGRA) 180 MG tablet Take 180 mg by mouth daily as needed (nasal drainage).    . fluticasone (FLONASE) 50 MCG/ACT nasal spray USE 2 SPRAYS NASALLY DAILY 48 g 3  . glucose blood (FREESTYLE TEST STRIPS) test strip Use as instructed 100 each 12  . LYRICA 75 MG capsule Take 75 mg by mouth 2 (two) times daily.     .  mometasone-formoterol (DULERA) 200-5 MCG/ACT AERO Take 2 puffs first thing in am and then another 2 puffs about 12 hours later. 3 Inhaler 3  . Multiple Vitamin (MULTIVITAMIN) tablet Take 1 tablet by mouth every morning.     . Omega-3 Fatty Acids (FISH OIL) 1200 MG CAPS Take 1 capsule by mouth every morning. (place in the freezer)    . polyethylene glycol powder (GLYCOLAX/MIRALAX) powder Take 17 grams (1 capful) every morning and 9 grams (1/2 capful) every evening 850 g 10  . pravastatin (PRAVACHOL) 40 MG tablet TAKE 1 TABLET DAILY 90 tablet 2  . SPIRIVA HANDIHALER 18 MCG inhalation capsule INHALE THE CONTENTS OF 1   CAPSULE VIA HANDIHALER     DAILY 90 capsule 3  . [DISCONTINUED] metformin (FORTAMET) 1000 MG (OSM) 24 hr tablet TAKE 1 TABLET TWICE A DAY 180 tablet 1   No current facility-administered medications on file prior to visit.     Allergies  Allergen Reactions  . Budesonide-Formoterol Fumarate Other (See Comments)    REACTION: coughed worse  . Lipitor [Atorvastatin] Other (See Comments)    Nosebleeds  . Penicillins Rash    REACTION: rash    Past Medical History:  Diagnosis Date  . Anxiety   . Asthma   . Breast cancer [facility])    h/o- radiation  . Carcinoid tumor of lung 1991  . COPD (chronic obstructive pulmonary disease) ([place])   . Depression   . Diabetes  mellitus type II   . Diverticulosis   . Gastroparesis   . GERD (gastroesophageal reflux disease)   . History of adenomatous polyp of colon   . History of breast cancer   . Hyperlipidemia   . Mild cognitive impairment   . Osteoarthritis   . Pulmonary nodule    multiple  . Renal cell carcinoma     Past Surgical History:  Procedure Laterality Date  . BREAST LUMPECTOMY  1998   right breast  . CHOLECYSTECTOMY  1975  . LAPAROSCOPIC NEPHRECTOMY  01/2009   right  . LUNG SURGERY  1994   carcinoid removal  . TONSILLECTOMY AND ADENOIDECTOMY  1953    Family History  Problem Relation Age of Onset  . Colon cancer  Father   . Lung cancer Father   . Stroke Mother   . Breast cancer Neg Hx   . Ovarian cancer Neg Hx   . Diabetes Neg Hx     Social History   Socioeconomic History  . Marital status: Married    Spouse name: Not on file  . Number of children: 2  . Years of education: Not on file  . Highest education level: Not on file  Social Needs  . Financial resource strain: Not on file  . Food insecurity - worry: Not on file  . Food insecurity - inability: Not on file  . Transportation needs - medical: Not on file  . Transportation needs - non-medical: Not on file  Occupational History  . Occupation: retired Designer, jewellery: RETIRED  Tobacco Use  . Smoking status: Former Smoker    Packs/day: 1.00    Years: 30.00    Pack years: 30.00    Types: Cigarettes    Last attempt to quit: 03/08/1993    Years since quitting: 24.1  . Smokeless tobacco: Never Used  Substance and Sexual Activity  . Alcohol use: No  . Drug use: No  . Sexual activity: Not Currently    Birth control/protection: Post-menopausal  Other Topics Concern  . Not on file  Social History Narrative   Has living will   Requests husband as health care POA--daughter [name] would be alternate   Now has DNR-- done 2/19   No tube feeds if cognitively unaware         Review of Systems Appetite is good Weight stable Sleeps well Wears seat belt Teeth fine---full dentures Bowels are good--no blood No heartburn or dysphagia No skin problems--no dermatologist     Objective:   Physical Exam  Constitutional: She appears well-developed. No distress.  HENT:  Mouth/Throat: Oropharynx is clear and moist. No oropharyngeal exudate.  Neck: No thyromegaly present.  Cardiovascular: Normal rate, regular rhythm and normal heart sounds. Exam reveals no gallop.  No murmur heard. Feet cool with faint pulses  Pulmonary/Chest: Effort normal and breath sounds normal. No respiratory distress. She has no wheezes. She has no rales.    Abdominal: Soft. She exhibits no distension. There is no tenderness. There is no rebound and no guarding.  Musculoskeletal: She exhibits no tenderness.  Slight puffiness in feet Mild bruising right foot (doesn't remember injury)  Lymphadenopathy:    She has no cervical adenopathy.  Neurological:  Little sensation in feet  Skin:  No foot ulcers or lesions  Psychiatric: She has a normal mood and affect. Her behavior is normal.          Assessment & Plan:

## 2017-05-06 NOTE — Assessment & Plan Note (Signed)
Mild symptoms now---controlled with the citalopram Has only been taking 10mg  daily--will continue

## 2017-05-06 NOTE — Assessment & Plan Note (Addendum)
Has had good control Doubt hypoglycemia on metformin but makes sense to cut back (500 bid) Significant sensory loss in feet but no sig pain

## 2017-05-06 NOTE — Assessment & Plan Note (Signed)
No Rx due to hypotension

## 2017-05-06 NOTE — Assessment & Plan Note (Signed)
Fairly stable functional status Getting driving evaluation--may need to stop driving

## 2017-05-06 NOTE — Progress Notes (Signed)
Hearing Screening Comments: Wears Hearing Aids Vision Screening Comments: May 2018

## 2017-05-06 NOTE — Assessment & Plan Note (Signed)
Some cough but no sig breathing problems

## 2017-05-06 NOTE — Assessment & Plan Note (Signed)
I have personally reviewed the Medicare Annual Wellness questionnaire and have noted 1. The patient's medical and social history 2. Their use of alcohol, tobacco or illicit drugs 3. Their current medications and supplements 4. The patient's functional ability including ADL's, fall risks, home safety risks and hearing or visual             impairment. 5. Diet and physical activities 6. Evidence for depression or mood disorders  The patients weight, height, BMI and visual acuity have been recorded in the chart I have made referrals, counseling and provided education to the patient based review of the above and I have provided the pt with a written personalized care plan for preventive services.  I have provided you with a copy of your personalized plan for preventive services. Please take the time to review along with your updated medication list.  Will give pneumovax booster Mammogram due 10/20 Colon due 2021 (had polyps) Discussed exercise

## 2017-05-06 NOTE — Addendum Note (Signed)
Addended by: Pilar Grammes on: 05/06/2017 11:27 AM   Modules accepted: Orders

## 2017-05-06 NOTE — Assessment & Plan Note (Signed)
Now has DNR

## 2017-05-10 DIAGNOSIS — J449 Chronic obstructive pulmonary disease, unspecified: Secondary | ICD-10-CM | POA: Diagnosis not present

## 2017-05-10 DIAGNOSIS — R269 Unspecified abnormalities of gait and mobility: Secondary | ICD-10-CM | POA: Diagnosis not present

## 2017-05-10 DIAGNOSIS — M6281 Muscle weakness (generalized): Secondary | ICD-10-CM | POA: Diagnosis not present

## 2017-05-16 ENCOUNTER — Encounter: Payer: Self-pay | Admitting: Internal Medicine

## 2017-05-16 MED ORDER — TIOTROPIUM BROMIDE MONOHYDRATE 18 MCG IN CAPS: ORAL_CAPSULE | RESPIRATORY_TRACT | 3 refills | Status: DC

## 2017-05-19 ENCOUNTER — Other Ambulatory Visit: Payer: Self-pay | Admitting: Internal Medicine

## 2017-05-24 DIAGNOSIS — R269 Unspecified abnormalities of gait and mobility: Secondary | ICD-10-CM | POA: Diagnosis not present

## 2017-05-24 DIAGNOSIS — J449 Chronic obstructive pulmonary disease, unspecified: Secondary | ICD-10-CM | POA: Diagnosis not present

## 2017-05-24 DIAGNOSIS — M6281 Muscle weakness (generalized): Secondary | ICD-10-CM | POA: Diagnosis not present

## 2017-05-31 DIAGNOSIS — J449 Chronic obstructive pulmonary disease, unspecified: Secondary | ICD-10-CM | POA: Diagnosis not present

## 2017-05-31 DIAGNOSIS — R269 Unspecified abnormalities of gait and mobility: Secondary | ICD-10-CM | POA: Diagnosis not present

## 2017-05-31 DIAGNOSIS — M6281 Muscle weakness (generalized): Secondary | ICD-10-CM | POA: Diagnosis not present

## 2017-06-07 DIAGNOSIS — J449 Chronic obstructive pulmonary disease, unspecified: Secondary | ICD-10-CM | POA: Diagnosis not present

## 2017-06-07 DIAGNOSIS — M6281 Muscle weakness (generalized): Secondary | ICD-10-CM | POA: Diagnosis not present

## 2017-06-07 DIAGNOSIS — R269 Unspecified abnormalities of gait and mobility: Secondary | ICD-10-CM | POA: Diagnosis not present

## 2017-06-14 DIAGNOSIS — R269 Unspecified abnormalities of gait and mobility: Secondary | ICD-10-CM | POA: Diagnosis not present

## 2017-06-14 DIAGNOSIS — M6281 Muscle weakness (generalized): Secondary | ICD-10-CM | POA: Diagnosis not present

## 2017-06-14 DIAGNOSIS — J449 Chronic obstructive pulmonary disease, unspecified: Secondary | ICD-10-CM | POA: Diagnosis not present

## 2017-06-16 DIAGNOSIS — M5137 Other intervertebral disc degeneration, lumbosacral region: Secondary | ICD-10-CM | POA: Diagnosis not present

## 2017-06-16 DIAGNOSIS — M5136 Other intervertebral disc degeneration, lumbar region: Secondary | ICD-10-CM | POA: Diagnosis not present

## 2017-06-16 DIAGNOSIS — M545 Low back pain: Secondary | ICD-10-CM | POA: Diagnosis not present

## 2017-06-16 DIAGNOSIS — M25551 Pain in right hip: Secondary | ICD-10-CM | POA: Diagnosis not present

## 2017-06-16 DIAGNOSIS — M9904 Segmental and somatic dysfunction of sacral region: Secondary | ICD-10-CM | POA: Diagnosis not present

## 2017-06-16 DIAGNOSIS — M9905 Segmental and somatic dysfunction of pelvic region: Secondary | ICD-10-CM | POA: Diagnosis not present

## 2017-06-16 DIAGNOSIS — M5418 Radiculopathy, sacral and sacrococcygeal region: Secondary | ICD-10-CM | POA: Diagnosis not present

## 2017-06-16 DIAGNOSIS — M5417 Radiculopathy, lumbosacral region: Secondary | ICD-10-CM | POA: Diagnosis not present

## 2017-06-20 DIAGNOSIS — M5417 Radiculopathy, lumbosacral region: Secondary | ICD-10-CM | POA: Diagnosis not present

## 2017-06-20 DIAGNOSIS — M5418 Radiculopathy, sacral and sacrococcygeal region: Secondary | ICD-10-CM | POA: Diagnosis not present

## 2017-06-20 DIAGNOSIS — M9905 Segmental and somatic dysfunction of pelvic region: Secondary | ICD-10-CM | POA: Diagnosis not present

## 2017-06-20 DIAGNOSIS — M5136 Other intervertebral disc degeneration, lumbar region: Secondary | ICD-10-CM | POA: Diagnosis not present

## 2017-06-20 DIAGNOSIS — M9904 Segmental and somatic dysfunction of sacral region: Secondary | ICD-10-CM | POA: Diagnosis not present

## 2017-06-20 DIAGNOSIS — M545 Low back pain: Secondary | ICD-10-CM | POA: Diagnosis not present

## 2017-06-20 DIAGNOSIS — M25551 Pain in right hip: Secondary | ICD-10-CM | POA: Diagnosis not present

## 2017-06-20 DIAGNOSIS — M5137 Other intervertebral disc degeneration, lumbosacral region: Secondary | ICD-10-CM | POA: Diagnosis not present

## 2017-06-21 DIAGNOSIS — R269 Unspecified abnormalities of gait and mobility: Secondary | ICD-10-CM | POA: Diagnosis not present

## 2017-06-21 DIAGNOSIS — M5136 Other intervertebral disc degeneration, lumbar region: Secondary | ICD-10-CM | POA: Diagnosis not present

## 2017-06-21 DIAGNOSIS — M6281 Muscle weakness (generalized): Secondary | ICD-10-CM | POA: Diagnosis not present

## 2017-06-21 DIAGNOSIS — M25551 Pain in right hip: Secondary | ICD-10-CM | POA: Diagnosis not present

## 2017-06-21 DIAGNOSIS — M5137 Other intervertebral disc degeneration, lumbosacral region: Secondary | ICD-10-CM | POA: Diagnosis not present

## 2017-06-21 DIAGNOSIS — M5417 Radiculopathy, lumbosacral region: Secondary | ICD-10-CM | POA: Diagnosis not present

## 2017-06-21 DIAGNOSIS — J449 Chronic obstructive pulmonary disease, unspecified: Secondary | ICD-10-CM | POA: Diagnosis not present

## 2017-06-21 DIAGNOSIS — M9905 Segmental and somatic dysfunction of pelvic region: Secondary | ICD-10-CM | POA: Diagnosis not present

## 2017-06-21 DIAGNOSIS — M9904 Segmental and somatic dysfunction of sacral region: Secondary | ICD-10-CM | POA: Diagnosis not present

## 2017-06-21 DIAGNOSIS — M5418 Radiculopathy, sacral and sacrococcygeal region: Secondary | ICD-10-CM | POA: Diagnosis not present

## 2017-06-21 DIAGNOSIS — M545 Low back pain: Secondary | ICD-10-CM | POA: Diagnosis not present

## 2017-06-27 DIAGNOSIS — M545 Low back pain: Secondary | ICD-10-CM | POA: Diagnosis not present

## 2017-06-28 DIAGNOSIS — J449 Chronic obstructive pulmonary disease, unspecified: Secondary | ICD-10-CM | POA: Diagnosis not present

## 2017-06-28 DIAGNOSIS — R269 Unspecified abnormalities of gait and mobility: Secondary | ICD-10-CM | POA: Diagnosis not present

## 2017-06-28 DIAGNOSIS — M6281 Muscle weakness (generalized): Secondary | ICD-10-CM | POA: Diagnosis not present

## 2017-07-12 DIAGNOSIS — J449 Chronic obstructive pulmonary disease, unspecified: Secondary | ICD-10-CM | POA: Diagnosis not present

## 2017-07-12 DIAGNOSIS — M6281 Muscle weakness (generalized): Secondary | ICD-10-CM | POA: Diagnosis not present

## 2017-07-12 DIAGNOSIS — R269 Unspecified abnormalities of gait and mobility: Secondary | ICD-10-CM | POA: Diagnosis not present

## 2017-07-19 DIAGNOSIS — J449 Chronic obstructive pulmonary disease, unspecified: Secondary | ICD-10-CM | POA: Diagnosis not present

## 2017-07-19 DIAGNOSIS — M6281 Muscle weakness (generalized): Secondary | ICD-10-CM | POA: Diagnosis not present

## 2017-07-19 DIAGNOSIS — R269 Unspecified abnormalities of gait and mobility: Secondary | ICD-10-CM | POA: Diagnosis not present

## 2017-08-16 DIAGNOSIS — Z9181 History of falling: Secondary | ICD-10-CM | POA: Diagnosis not present

## 2017-08-16 DIAGNOSIS — Z6821 Body mass index (BMI) 21.0-21.9, adult: Secondary | ICD-10-CM | POA: Diagnosis not present

## 2017-08-16 DIAGNOSIS — F0390 Unspecified dementia without behavioral disturbance: Secondary | ICD-10-CM | POA: Diagnosis not present

## 2017-08-16 DIAGNOSIS — E0822 Diabetes mellitus due to underlying condition with diabetic chronic kidney disease: Secondary | ICD-10-CM | POA: Diagnosis not present

## 2017-08-17 DIAGNOSIS — H2511 Age-related nuclear cataract, right eye: Secondary | ICD-10-CM | POA: Diagnosis not present

## 2017-10-25 DIAGNOSIS — M9905 Segmental and somatic dysfunction of pelvic region: Secondary | ICD-10-CM | POA: Diagnosis not present

## 2017-10-25 DIAGNOSIS — M25551 Pain in right hip: Secondary | ICD-10-CM | POA: Diagnosis not present

## 2017-10-25 DIAGNOSIS — M9904 Segmental and somatic dysfunction of sacral region: Secondary | ICD-10-CM | POA: Diagnosis not present

## 2017-10-25 DIAGNOSIS — M545 Low back pain: Secondary | ICD-10-CM | POA: Diagnosis not present

## 2017-10-25 DIAGNOSIS — M5136 Other intervertebral disc degeneration, lumbar region: Secondary | ICD-10-CM | POA: Diagnosis not present

## 2017-10-25 DIAGNOSIS — M5137 Other intervertebral disc degeneration, lumbosacral region: Secondary | ICD-10-CM | POA: Diagnosis not present

## 2017-10-25 DIAGNOSIS — M5418 Radiculopathy, sacral and sacrococcygeal region: Secondary | ICD-10-CM | POA: Diagnosis not present

## 2017-10-25 DIAGNOSIS — M5417 Radiculopathy, lumbosacral region: Secondary | ICD-10-CM | POA: Diagnosis not present

## 2017-11-03 DIAGNOSIS — M5137 Other intervertebral disc degeneration, lumbosacral region: Secondary | ICD-10-CM | POA: Diagnosis not present

## 2017-11-03 DIAGNOSIS — M5136 Other intervertebral disc degeneration, lumbar region: Secondary | ICD-10-CM | POA: Diagnosis not present

## 2017-11-03 DIAGNOSIS — M545 Low back pain: Secondary | ICD-10-CM | POA: Diagnosis not present

## 2017-11-03 DIAGNOSIS — M9904 Segmental and somatic dysfunction of sacral region: Secondary | ICD-10-CM | POA: Diagnosis not present

## 2017-11-03 DIAGNOSIS — M5418 Radiculopathy, sacral and sacrococcygeal region: Secondary | ICD-10-CM | POA: Diagnosis not present

## 2017-11-03 DIAGNOSIS — M9905 Segmental and somatic dysfunction of pelvic region: Secondary | ICD-10-CM | POA: Diagnosis not present

## 2017-11-03 DIAGNOSIS — M25551 Pain in right hip: Secondary | ICD-10-CM | POA: Diagnosis not present

## 2017-11-03 DIAGNOSIS — M5417 Radiculopathy, lumbosacral region: Secondary | ICD-10-CM | POA: Diagnosis not present

## 2017-11-04 DIAGNOSIS — M25551 Pain in right hip: Secondary | ICD-10-CM | POA: Diagnosis not present

## 2017-11-04 DIAGNOSIS — M5418 Radiculopathy, sacral and sacrococcygeal region: Secondary | ICD-10-CM | POA: Diagnosis not present

## 2017-11-04 DIAGNOSIS — M9904 Segmental and somatic dysfunction of sacral region: Secondary | ICD-10-CM | POA: Diagnosis not present

## 2017-11-04 DIAGNOSIS — M5136 Other intervertebral disc degeneration, lumbar region: Secondary | ICD-10-CM | POA: Diagnosis not present

## 2017-11-04 DIAGNOSIS — M5417 Radiculopathy, lumbosacral region: Secondary | ICD-10-CM | POA: Diagnosis not present

## 2017-11-04 DIAGNOSIS — M5137 Other intervertebral disc degeneration, lumbosacral region: Secondary | ICD-10-CM | POA: Diagnosis not present

## 2017-11-04 DIAGNOSIS — M545 Low back pain: Secondary | ICD-10-CM | POA: Diagnosis not present

## 2017-11-04 DIAGNOSIS — M9905 Segmental and somatic dysfunction of pelvic region: Secondary | ICD-10-CM | POA: Diagnosis not present

## 2017-11-08 ENCOUNTER — Encounter: Payer: Self-pay | Admitting: Internal Medicine

## 2017-11-08 ENCOUNTER — Ambulatory Visit (INDEPENDENT_AMBULATORY_CARE_PROVIDER_SITE_OTHER): Payer: Medicare Other | Admitting: Internal Medicine

## 2017-11-08 VITALS — BP 118/74 | HR 84 | Temp 97.7°F | Ht 62.0 in | Wt 125.0 lb

## 2017-11-08 DIAGNOSIS — M25551 Pain in right hip: Secondary | ICD-10-CM | POA: Diagnosis not present

## 2017-11-08 DIAGNOSIS — Z23 Encounter for immunization: Secondary | ICD-10-CM

## 2017-11-08 DIAGNOSIS — M25552 Pain in left hip: Secondary | ICD-10-CM | POA: Diagnosis not present

## 2017-11-08 DIAGNOSIS — J449 Chronic obstructive pulmonary disease, unspecified: Secondary | ICD-10-CM | POA: Diagnosis not present

## 2017-11-08 DIAGNOSIS — M9905 Segmental and somatic dysfunction of pelvic region: Secondary | ICD-10-CM | POA: Diagnosis not present

## 2017-11-08 DIAGNOSIS — M5137 Other intervertebral disc degeneration, lumbosacral region: Secondary | ICD-10-CM | POA: Diagnosis not present

## 2017-11-08 DIAGNOSIS — F0150 Vascular dementia without behavioral disturbance: Secondary | ICD-10-CM

## 2017-11-08 DIAGNOSIS — M5136 Other intervertebral disc degeneration, lumbar region: Secondary | ICD-10-CM | POA: Diagnosis not present

## 2017-11-08 DIAGNOSIS — E1140 Type 2 diabetes mellitus with diabetic neuropathy, unspecified: Secondary | ICD-10-CM | POA: Diagnosis not present

## 2017-11-08 DIAGNOSIS — M5417 Radiculopathy, lumbosacral region: Secondary | ICD-10-CM | POA: Diagnosis not present

## 2017-11-08 DIAGNOSIS — M5418 Radiculopathy, sacral and sacrococcygeal region: Secondary | ICD-10-CM | POA: Diagnosis not present

## 2017-11-08 DIAGNOSIS — M545 Low back pain: Secondary | ICD-10-CM | POA: Diagnosis not present

## 2017-11-08 DIAGNOSIS — M9904 Segmental and somatic dysfunction of sacral region: Secondary | ICD-10-CM | POA: Diagnosis not present

## 2017-11-08 LAB — HM DIABETES FOOT EXAM

## 2017-11-08 LAB — POCT GLYCOSYLATED HEMOGLOBIN (HGB A1C): Hemoglobin A1C: 6.6 % — AB (ref 4.0–5.6)

## 2017-11-08 MED ORDER — PREGABALIN 75 MG PO CAPS: 75.0000 mg | ORAL_CAPSULE | Freq: Three times a day (TID) | ORAL | 3 refills | Status: DC

## 2017-11-08 NOTE — Assessment & Plan Note (Signed)
No apparent progression Functionally stable

## 2017-11-08 NOTE — Assessment & Plan Note (Signed)
Probably mostly arthritic Not clearly ready for THR--but may need to consider Did have injury with fall some months ago--and some better

## 2017-11-08 NOTE — Progress Notes (Signed)
Subjective:    Patient ID: [name], female    DOB: 1945-06-04, 72 y.o.   MRN: [id number]  HPI Here with husband for follow up of diabetes and other chronic health conditions  Having trouble with her left hip Saw ortho--given pain pill (naproxen)---helps some but then worsened Got muscle relaxer Is going to chiropractor--Dr Estell Harpin very limited by this--only walks around the house--but afraid to go in the yard  Diabetes has been okay Not checking sugars much Ongoing foot numbness---no pain  Breathing is okay Not much cough No wheezing  Memory is about this same Husband feels there is no functional decline  Current Outpatient Medications on File Prior to Visit  Medication Sig Dispense Refill  . albuterol (PROVENTIL HFA;VENTOLIN HFA) 108 (90 Base) MCG/ACT inhaler Inhale 2 puffs into the lungs every 6 (six) hours as needed.    Marland Kitchen alendronate (FOSAMAX) 70 MG tablet TK 1 T PO Q 7 DAYS  11  . Cholecalciferol (VITAMIN D3) 5000 UNITS CAPS Take 1 capsule by mouth every morning.    . citalopram (CELEXA) 20 MG tablet Take 0.5 tablets (10 mg total) by mouth daily. 1 tablet 0  . fexofenadine (ALLEGRA) 180 MG tablet Take 180 mg by mouth daily as needed (nasal drainage).    . fluticasone (FLONASE) 50 MCG/ACT nasal spray USE 2 SPRAYS NASALLY DAILY 48 g 3  . glucose blood (FREESTYLE TEST STRIPS) test strip Use as instructed 100 each 12  . LYRICA 75 MG capsule Take 75 mg by mouth 2 (two) times daily.     . metFORMIN (GLUCOPHAGE) 500 MG tablet Take 1 tablet (500 mg total) by mouth 2 (two) times daily with a meal. 180 tablet 3  . mometasone-formoterol (DULERA) 200-5 MCG/ACT AERO Take 2 puffs first thing in am and then another 2 puffs about 12 hours later. 3 Inhaler 3  . Multiple Vitamin (MULTIVITAMIN) tablet Take 1 tablet by mouth every morning.     . Omega-3 Fatty Acids (FISH OIL) 1200 MG CAPS Take 1 capsule by mouth every morning. (place in the freezer)    . polyethylene glycol  powder (GLYCOLAX/MIRALAX) powder Take 17 grams (1 capful) every morning and 9 grams (1/2 capful) every evening 850 g 10  . pravastatin (PRAVACHOL) 40 MG tablet TAKE 1 TABLET DAILY 90 tablet 3  . tiotropium (SPIRIVA HANDIHALER) 18 MCG inhalation capsule INHALE THE CONTENTS OF 1   CAPSULE VIA HANDIHALER     DAILY 90 capsule 3   No current facility-administered medications on file prior to visit.     Allergies  Allergen Reactions  . Budesonide-Formoterol Fumarate Other (See Comments)    REACTION: coughed worse  . Lipitor [Atorvastatin] Other (See Comments)    Nosebleeds  . Penicillins Rash    REACTION: rash    Past Medical History:  Diagnosis Date  . Anxiety   . Asthma   . Breast cancer [facility])    h/o- radiation  . Carcinoid tumor of lung 1991  . COPD (chronic obstructive pulmonary disease) ([place])   . Depression   . Diabetes mellitus type II   . Diverticulosis   . Gastroparesis   . GERD (gastroesophageal reflux disease)   . History of adenomatous polyp of colon   . History of breast cancer   . Hyperlipidemia   . Mild cognitive impairment   . Osteoarthritis   . Pulmonary nodule    multiple  . Renal cell carcinoma     Past Surgical History:  Procedure Laterality  Date  . BREAST LUMPECTOMY  1998   right breast  . CHOLECYSTECTOMY  1975  . LAPAROSCOPIC NEPHRECTOMY  01/2009   right  . LUNG SURGERY  1994   carcinoid removal  . TONSILLECTOMY AND ADENOIDECTOMY  1953    Family History  Problem Relation Age of Onset  . Colon cancer Father   . Lung cancer Father   . Stroke Mother   . Breast cancer Neg Hx   . Ovarian cancer Neg Hx   . Diabetes Neg Hx     Social History   Socioeconomic History  . Marital status: Married    Spouse name: Not on file  . Number of children: 2  . Years of education: Not on file  . Highest education level: Not on file  Occupational History  . Occupation: retired Designer, jewellery: RETIRED  Social Needs  . Financial resource strain:  Not on file  . Food insecurity:    Worry: Not on file    Inability: Not on file  . Transportation needs:    Medical: Not on file    Non-medical: Not on file  Tobacco Use  . Smoking status: Former Smoker    Packs/day: 1.00    Years: 30.00    Pack years: 30.00    Types: Cigarettes    Last attempt to quit: 03/08/1993    Years since quitting: 24.6  . Smokeless tobacco: Never Used  Substance and Sexual Activity  . Alcohol use: No  . Drug use: No  . Sexual activity: Not Currently    Birth control/protection: Post-menopausal  Lifestyle  . Physical activity:    Days per week: Not on file    Minutes per session: Not on file  . Stress: Not on file  Relationships  . Social connections:    Talks on phone: Not on file    Gets together: Not on file    Attends religious service: Not on file    Active member of club or organization: Not on file    Attends meetings of clubs or organizations: Not on file    Relationship status: Not on file  . Intimate partner violence:    Fear of current or ex partner: Not on file    Emotionally abused: Not on file    Physically abused: Not on file    Forced sexual activity: Not on file  Other Topics Concern  . Not on file  Social History Narrative   Has living will   Requests husband as health care POA--daughter [name] would be alternate   Now has DNR-- done 2/19   No tube feeds if cognitively unaware         Review of Systems No chest pain Sleeping fine Appetite is good    Objective:   Physical Exam  Constitutional: She appears well-developed. No distress.  Neck: No thyromegaly present.  Cardiovascular: Normal rate, regular rhythm and normal heart sounds. Exam reveals no gallop.  No murmur heard. Very faint pedal pulses  Respiratory: Effort normal. No respiratory distress. She has no wheezes. She has no rales.  Decreased breath sounds but clear  Musculoskeletal:  Mild pain with and reduced internal rotation in both hips No bursa  tenderness  Lymphadenopathy:    She has no cervical adenopathy.  Neurological:  Decreased sensation in feet  Skin:  No foot lesions           Assessment & Plan:

## 2017-11-08 NOTE — Assessment & Plan Note (Signed)
No problems with cough or dyspnea

## 2017-11-08 NOTE — Assessment & Plan Note (Signed)
Lab Results  Component Value Date   HGBA1C 6.6 (A) 11/08/2017   Good control Asked her to check occasionally Needs the lyrica tid for symptom relief

## 2017-11-08 NOTE — Addendum Note (Signed)
Addended by: Pilar Grammes on: 11/08/2017 09:58 AM   Modules accepted: Orders

## 2017-11-09 DIAGNOSIS — M25551 Pain in right hip: Secondary | ICD-10-CM | POA: Diagnosis not present

## 2017-11-09 DIAGNOSIS — M9905 Segmental and somatic dysfunction of pelvic region: Secondary | ICD-10-CM | POA: Diagnosis not present

## 2017-11-09 DIAGNOSIS — M545 Low back pain: Secondary | ICD-10-CM | POA: Diagnosis not present

## 2017-11-09 DIAGNOSIS — M5417 Radiculopathy, lumbosacral region: Secondary | ICD-10-CM | POA: Diagnosis not present

## 2017-11-09 DIAGNOSIS — M5418 Radiculopathy, sacral and sacrococcygeal region: Secondary | ICD-10-CM | POA: Diagnosis not present

## 2017-11-09 DIAGNOSIS — M9904 Segmental and somatic dysfunction of sacral region: Secondary | ICD-10-CM | POA: Diagnosis not present

## 2017-11-09 DIAGNOSIS — M5137 Other intervertebral disc degeneration, lumbosacral region: Secondary | ICD-10-CM | POA: Diagnosis not present

## 2017-11-09 DIAGNOSIS — M5136 Other intervertebral disc degeneration, lumbar region: Secondary | ICD-10-CM | POA: Diagnosis not present

## 2017-11-10 DIAGNOSIS — M25551 Pain in right hip: Secondary | ICD-10-CM | POA: Diagnosis not present

## 2017-11-10 DIAGNOSIS — M545 Low back pain: Secondary | ICD-10-CM | POA: Diagnosis not present

## 2017-11-10 DIAGNOSIS — M5137 Other intervertebral disc degeneration, lumbosacral region: Secondary | ICD-10-CM | POA: Diagnosis not present

## 2017-11-10 DIAGNOSIS — M5418 Radiculopathy, sacral and sacrococcygeal region: Secondary | ICD-10-CM | POA: Diagnosis not present

## 2017-11-10 DIAGNOSIS — M5136 Other intervertebral disc degeneration, lumbar region: Secondary | ICD-10-CM | POA: Diagnosis not present

## 2017-11-10 DIAGNOSIS — M5417 Radiculopathy, lumbosacral region: Secondary | ICD-10-CM | POA: Diagnosis not present

## 2017-11-10 DIAGNOSIS — M9905 Segmental and somatic dysfunction of pelvic region: Secondary | ICD-10-CM | POA: Diagnosis not present

## 2017-11-10 DIAGNOSIS — M9904 Segmental and somatic dysfunction of sacral region: Secondary | ICD-10-CM | POA: Diagnosis not present

## 2017-11-11 DIAGNOSIS — M5418 Radiculopathy, sacral and sacrococcygeal region: Secondary | ICD-10-CM | POA: Diagnosis not present

## 2017-11-11 DIAGNOSIS — M5137 Other intervertebral disc degeneration, lumbosacral region: Secondary | ICD-10-CM | POA: Diagnosis not present

## 2017-11-11 DIAGNOSIS — M5417 Radiculopathy, lumbosacral region: Secondary | ICD-10-CM | POA: Diagnosis not present

## 2017-11-11 DIAGNOSIS — M9904 Segmental and somatic dysfunction of sacral region: Secondary | ICD-10-CM | POA: Diagnosis not present

## 2017-11-11 DIAGNOSIS — M9905 Segmental and somatic dysfunction of pelvic region: Secondary | ICD-10-CM | POA: Diagnosis not present

## 2017-11-11 DIAGNOSIS — M5136 Other intervertebral disc degeneration, lumbar region: Secondary | ICD-10-CM | POA: Diagnosis not present

## 2017-11-11 DIAGNOSIS — M545 Low back pain: Secondary | ICD-10-CM | POA: Diagnosis not present

## 2017-11-11 DIAGNOSIS — M25551 Pain in right hip: Secondary | ICD-10-CM | POA: Diagnosis not present

## 2017-12-22 MED ORDER — FLUTICASONE PROPIONATE 50 MCG/ACT NA SUSP: NASAL | 3 refills | Status: DC

## 2017-12-23 ENCOUNTER — Ambulatory Visit (INDEPENDENT_AMBULATORY_CARE_PROVIDER_SITE_OTHER): Payer: Medicare Other | Admitting: Internal Medicine

## 2017-12-23 ENCOUNTER — Encounter: Payer: Self-pay | Admitting: Internal Medicine

## 2017-12-23 VITALS — BP 106/70 | HR 94 | Temp 98.6°F | Resp 20 | Ht 62.0 in | Wt 129.0 lb

## 2017-12-23 DIAGNOSIS — J449 Chronic obstructive pulmonary disease, unspecified: Secondary | ICD-10-CM

## 2017-12-23 DIAGNOSIS — J22 Unspecified acute lower respiratory infection: Secondary | ICD-10-CM

## 2017-12-23 MED ORDER — DOXYCYCLINE HYCLATE 100 MG PO TABS: 100.0000 mg | ORAL_TABLET | Freq: Two times a day (BID) | ORAL | 0 refills | Status: DC

## 2017-12-23 NOTE — Assessment & Plan Note (Signed)
Sounds viral Does have bronchial component to her COPD  Will give Rx for doxy to start if worsens

## 2017-12-23 NOTE — Progress Notes (Signed)
Subjective:    Patient ID: [name], female    DOB: Feb 26, 1946, 72 y.o.   MRN: [id number]  HPI Here due to respiratory infection With husband  "I guess I just have a cold" Congestion, cough, temp to 101 this morning Suddenly came on---started a bit last night Head and chest congestion Some shivering earlier this morning advil did help the fever Some SOB---used the nebulizer with some relief Cough is dry  No sore throat or ear pain No muscle aches  also tried allegra D Does have fall allergies  Current Outpatient Medications on File Prior to Visit  Medication Sig Dispense Refill  . albuterol (PROVENTIL HFA;VENTOLIN HFA) 108 (90 Base) MCG/ACT inhaler Inhale 2 puffs into the lungs every 6 (six) hours as needed.    Marland Kitchen alendronate (FOSAMAX) 70 MG tablet TK 1 T PO Q 7 DAYS  11  . Cholecalciferol (VITAMIN D3) 5000 UNITS CAPS Take 1 capsule by mouth every morning.    . citalopram (CELEXA) 20 MG tablet Take 0.5 tablets (10 mg total) by mouth daily. 1 tablet 0  . fexofenadine (ALLEGRA) 180 MG tablet Take 180 mg by mouth daily as needed (nasal drainage).    . fluticasone (FLONASE) 50 MCG/ACT nasal spray USE 2 SPRAYS NASALLY DAILY 48 g 3  . metFORMIN (GLUCOPHAGE) 500 MG tablet Take 1 tablet (500 mg total) by mouth 2 (two) times daily with a meal. 180 tablet 3  . mometasone-formoterol (DULERA) 200-5 MCG/ACT AERO Take 2 puffs first thing in am and then another 2 puffs about 12 hours later. 3 Inhaler 3  . Multiple Vitamin (MULTIVITAMIN) tablet Take 1 tablet by mouth every morning.     . Omega-3 Fatty Acids (FISH OIL) 1200 MG CAPS Take 1 capsule by mouth every morning. (place in the freezer)    . polyethylene glycol powder (GLYCOLAX/MIRALAX) powder Take 17 grams (1 capful) every morning and 9 grams (1/2 capful) every evening 850 g 10  . pravastatin (PRAVACHOL) 40 MG tablet TAKE 1 TABLET DAILY 90 tablet 3  . pregabalin (LYRICA) 75 MG capsule Take 1 capsule (75 mg total) by mouth 3  (three) times daily. 270 capsule 3  . tiotropium (SPIRIVA HANDIHALER) 18 MCG inhalation capsule INHALE THE CONTENTS OF 1   CAPSULE VIA HANDIHALER     DAILY 90 capsule 3   No current facility-administered medications on file prior to visit.     Allergies  Allergen Reactions  . Budesonide-Formoterol Fumarate Other (See Comments)    REACTION: coughed worse  . Lipitor [Atorvastatin] Other (See Comments)    Nosebleeds  . Penicillins Rash    REACTION: rash    Past Medical History:  Diagnosis Date  . Anxiety   . Asthma   . Breast cancer [facility])    h/o- radiation  . Carcinoid tumor of lung 1991  . COPD (chronic obstructive pulmonary disease) ([place])   . Depression   . Diabetes mellitus type II   . Diverticulosis   . Gastroparesis   . GERD (gastroesophageal reflux disease)   . History of adenomatous polyp of colon   . History of breast cancer   . Hyperlipidemia   . Mild cognitive impairment   . Osteoarthritis   . Pulmonary nodule    multiple  . Renal cell carcinoma     Past Surgical History:  Procedure Laterality Date  . BREAST LUMPECTOMY  1998   right breast  . CHOLECYSTECTOMY  1975  . LAPAROSCOPIC NEPHRECTOMY  01/2009   right  .  LUNG SURGERY  1994   carcinoid removal  . TONSILLECTOMY AND ADENOIDECTOMY  1953    Family History  Problem Relation Age of Onset  . Colon cancer Father   . Lung cancer Father   . Stroke Mother   . Breast cancer Neg Hx   . Ovarian cancer Neg Hx   . Diabetes Neg Hx     Social History   Socioeconomic History  . Marital status: Married    Spouse name: Not on file  . Number of children: 2  . Years of education: Not on file  . Highest education level: Not on file  Occupational History  . Occupation: retired Designer, jewellery: RETIRED  Social Needs  . Financial resource strain: Not on file  . Food insecurity:    Worry: Not on file    Inability: Not on file  . Transportation needs:    Medical: Not on file    Non-medical: Not on  file  Tobacco Use  . Smoking status: Former Smoker    Packs/day: 1.00    Years: 30.00    Pack years: 30.00    Types: Cigarettes    Last attempt to quit: 03/08/1993    Years since quitting: 24.8  . Smokeless tobacco: Never Used  Substance and Sexual Activity  . Alcohol use: No  . Drug use: No  . Sexual activity: Not Currently    Birth control/protection: Post-menopausal  Lifestyle  . Physical activity:    Days per week: Not on file    Minutes per session: Not on file  . Stress: Not on file  Relationships  . Social connections:    Talks on phone: Not on file    Gets together: Not on file    Attends religious service: Not on file    Active member of club or organization: Not on file    Attends meetings of clubs or organizations: Not on file    Relationship status: Not on file  . Intimate partner violence:    Fear of current or ex partner: Not on file    Emotionally abused: Not on file    Physically abused: Not on file    Forced sexual activity: Not on file  Other Topics Concern  . Not on file  Social History Narrative   Has living will   Requests husband as health care POA--daughter [name] would be alternate   Now has DNR-- done 2/19   No tube feeds if cognitively unaware         Review of Systems No rash No vomiting or diarrhea Appetite is okay    Objective:   Physical Exam  HENT:  Mild pharyngeal injection No exudates TMs normal Mild nasal inflammation  Neck: No thyromegaly present.  Respiratory: Effort normal. No respiratory distress. She has no wheezes. She has no rales.  Decreased breath sounds but clear  Lymphadenopathy:    She has no cervical adenopathy.           Assessment & Plan:

## 2017-12-23 NOTE — Assessment & Plan Note (Signed)
No clear exacerbation Nebs prn and usually meds for now

## 2018-01-10 ENCOUNTER — Other Ambulatory Visit: Payer: Self-pay | Admitting: Internal Medicine

## 2018-01-10 DIAGNOSIS — Z1231 Encounter for screening mammogram for malignant neoplasm of breast: Secondary | ICD-10-CM

## 2018-01-10 DIAGNOSIS — M25552 Pain in left hip: Secondary | ICD-10-CM

## 2018-01-24 ENCOUNTER — Ambulatory Visit (INDEPENDENT_AMBULATORY_CARE_PROVIDER_SITE_OTHER): Payer: Medicare Other

## 2018-01-24 ENCOUNTER — Other Ambulatory Visit (INDEPENDENT_AMBULATORY_CARE_PROVIDER_SITE_OTHER): Payer: Self-pay | Admitting: Orthopaedic Surgery

## 2018-01-24 ENCOUNTER — Ambulatory Visit (INDEPENDENT_AMBULATORY_CARE_PROVIDER_SITE_OTHER): Payer: Medicare Other | Admitting: Orthopaedic Surgery

## 2018-01-24 DIAGNOSIS — M5431 Sciatica, right side: Secondary | ICD-10-CM

## 2018-01-24 DIAGNOSIS — M25551 Pain in right hip: Secondary | ICD-10-CM

## 2018-01-24 MED ORDER — METHYLPREDNISOLONE 4 MG PO TABS: ORAL_TABLET | ORAL | 0 refills | Status: DC

## 2018-01-24 MED ORDER — MELOXICAM 15 MG PO TABS: 15.0000 mg | ORAL_TABLET | Freq: Every day | ORAL | 1 refills | Status: DC

## 2018-01-24 NOTE — Progress Notes (Signed)
Office Visit Note   Patient: [name]           Date of Birth: [birth year]           MRN: [id number] Visit Date: 01/24/2018              Requested by: Venia Carbon, MD [place], [place] [id number] PCP: Venia Carbon, MD   Assessment & Plan: Visit Diagnoses:  1. Pain in right hip   2. Sciatica, right side     Plan: This seems to be more of a spine issue and not a hip issue.  I would like to send her to physical therapy to see if they can provide some modalities to decrease her pelvic and low back pain to the right side and improve her function.  I will put her on meloxicam and a 6-day steroid taper.  We will see her back in 3 weeks to see how she is doing overall.  She still having some problems I would like to get an AP and lateral lumbar spine.  All questions concerns were answered and addressed.  Follow-Up Instructions: Return in about 3 weeks (around 02/14/2018).   Orders:  Orders Placed This Encounter  Procedures  . XR HIP UNILAT W OR W/O PELVIS 1V RIGHT   Meds ordered this encounter  Medications  . meloxicam (MOBIC) 15 MG tablet    Sig: Take 1 tablet (15 mg total) by mouth daily.    Dispense:  30 tablet    Refill:  1  . methylPREDNISolone (MEDROL) 4 MG tablet    Sig: Medrol dose pack. Take as instructed    Dispense:  21 tablet    Refill:  0      Procedures: No procedures performed   Clinical Data: No additional findings.   Subjective: Chief Complaint  Patient presents with  . Right Hip - Pain  The patient comes in his referral for evaluation treatment of right hip pain is been going on for about 4 to 5 months after mechanical fall.  She is never got any type of treatment she states and did not take anti-inflammatories.  She denies any groin pain and the pain is worse when walking.  She points actually to her posterior aspect of her low back and pelvis the source of her pain and not her hip itself.  She does not walk with an  assistive device.  She is 72 years old and very thin.  She is a diabetic but reports good control.  She has not had any symptoms like this before she fell.  HPI  Review of Systems She currently denies any headache, chest pain, shortness of breath, fever, chills, nausea, vomiting.  Objective: Vital Signs: There were no vitals taken for this visit.  Physical Exam She is alert and oriented x3 and in no acute distress Ortho Exam Examination of her right hip shows a normal exam of the hip.  Her internal extra rotation are full.  Compression hip causes no pain.  There is no pain in the groin at all.  There is no pain to palpation of the trochanteric area.  She does have pain in the posterior pelvis, ischial area and low back to the right side. Specialty Comments:  No specialty comments available.  Imaging: Xr Hip Unilat W Or W/o Pelvis 1v Right  Result Date: 01/24/2018 An AP pelvis and lateral of the right hip shows normal-appearing hip joint with no acute findings.  There is no significant arthritic change in the hip.  The lower aspect of the lumbar spine can be seen and there is severe degenerative changes at the lowest level lumbar spine on the single AP view of the pelvis.    PMFS History: Patient Active Problem List   Diagnosis Date Noted  . Acute respiratory infection 12/23/2017  . Left hip pain 11/08/2017  . CKD stage 3 due to type 2 diabetes mellitus ([place]) 01/26/2017  . Advance directive discussed with patient 05/02/2015  . Rectocele 05/01/2015  . Uterine prolapse 05/01/2015  . Vascular dementia without behavioral disturbance ([place])   . Routine general medical examination at a health care facility 08/10/2010  . Type 2 diabetes, controlled, with neuropathy ([place]) 05/16/2009  . OSTEOARTHRITIS 05/16/2009  . DIVERTICULOSIS-COLON 12/23/2008  . COPD (chronic obstructive pulmonary disease) ([place]) 02/15/2008  . HYPERLIPIDEMIA 12/22/2007  . Episodic mood disorder ([place]) 12/22/2007  .  ALLERGIC RHINITIS 12/22/2007  . Multiple pulmonary nodules 12/22/2007  . GERD 12/22/2007  . BREAST CANCER, HX OF 12/22/2007  . COLONIC POLYPS, HX OF 12/22/2007   Past Medical History:  Diagnosis Date  . Anxiety   . Asthma   . Breast cancer [facility])    h/o- radiation  . Carcinoid tumor of lung 1991  . COPD (chronic obstructive pulmonary disease) ([place])   . Depression   . Diabetes mellitus type II   . Diverticulosis   . Gastroparesis   . GERD (gastroesophageal reflux disease)   . History of adenomatous polyp of colon   . History of breast cancer   . Hyperlipidemia   . Mild cognitive impairment   . Osteoarthritis   . Pulmonary nodule    multiple  . Renal cell carcinoma     Family History  Problem Relation Age of Onset  . Colon cancer Father   . Lung cancer Father   . Stroke Mother   . Breast cancer Neg Hx   . Ovarian cancer Neg Hx   . Diabetes Neg Hx     Past Surgical History:  Procedure Laterality Date  . BREAST LUMPECTOMY  1998   right breast  . CHOLECYSTECTOMY  1975  . LAPAROSCOPIC NEPHRECTOMY  01/2009   right  . LUNG SURGERY  1994   carcinoid removal  . TONSILLECTOMY AND ADENOIDECTOMY  1953   Social History   Occupational History  . Occupation: retired Designer, jewellery: RETIRED  Tobacco Use  . Smoking status: Former Smoker    Packs/day: 1.00    Years: 30.00    Pack years: 30.00    Types: Cigarettes    Last attempt to quit: 03/08/1993    Years since quitting: 24.8  . Smokeless tobacco: Never Used  Substance and Sexual Activity  . Alcohol use: No  . Drug use: No  . Sexual activity: Not Currently    Birth control/protection: Post-menopausal

## 2018-02-15 ENCOUNTER — Ambulatory Visit (INDEPENDENT_AMBULATORY_CARE_PROVIDER_SITE_OTHER): Payer: Medicare Other | Admitting: Orthopaedic Surgery

## 2018-02-15 DIAGNOSIS — F0150 Vascular dementia without behavioral disturbance: Secondary | ICD-10-CM | POA: Diagnosis not present

## 2018-02-15 DIAGNOSIS — Z6821 Body mass index (BMI) 21.0-21.9, adult: Secondary | ICD-10-CM | POA: Diagnosis not present

## 2018-02-15 DIAGNOSIS — R269 Unspecified abnormalities of gait and mobility: Secondary | ICD-10-CM | POA: Diagnosis not present

## 2018-02-15 DIAGNOSIS — Z7984 Long term (current) use of oral hypoglycemic drugs: Secondary | ICD-10-CM | POA: Diagnosis not present

## 2018-02-15 DIAGNOSIS — M79671 Pain in right foot: Secondary | ICD-10-CM | POA: Diagnosis not present

## 2018-02-15 DIAGNOSIS — F329 Major depressive disorder, single episode, unspecified: Secondary | ICD-10-CM | POA: Diagnosis not present

## 2018-02-15 DIAGNOSIS — N189 Chronic kidney disease, unspecified: Secondary | ICD-10-CM | POA: Diagnosis not present

## 2018-02-15 DIAGNOSIS — Z853 Personal history of malignant neoplasm of breast: Secondary | ICD-10-CM | POA: Diagnosis not present

## 2018-02-15 DIAGNOSIS — Z7951 Long term (current) use of inhaled steroids: Secondary | ICD-10-CM | POA: Diagnosis not present

## 2018-02-15 DIAGNOSIS — E1122 Type 2 diabetes mellitus with diabetic chronic kidney disease: Secondary | ICD-10-CM | POA: Diagnosis not present

## 2018-02-15 DIAGNOSIS — Z9181 History of falling: Secondary | ICD-10-CM | POA: Diagnosis not present

## 2018-02-15 DIAGNOSIS — Z905 Acquired absence of kidney: Secondary | ICD-10-CM | POA: Diagnosis not present

## 2018-02-15 DIAGNOSIS — J449 Chronic obstructive pulmonary disease, unspecified: Secondary | ICD-10-CM | POA: Diagnosis not present

## 2018-02-15 DIAGNOSIS — Z7983 Long term (current) use of bisphosphonates: Secondary | ICD-10-CM | POA: Diagnosis not present

## 2018-02-15 DIAGNOSIS — F0390 Unspecified dementia without behavioral disturbance: Secondary | ICD-10-CM | POA: Diagnosis not present

## 2018-02-15 DIAGNOSIS — M1610 Unilateral primary osteoarthritis, unspecified hip: Secondary | ICD-10-CM | POA: Diagnosis not present

## 2018-02-15 DIAGNOSIS — E1142 Type 2 diabetes mellitus with diabetic polyneuropathy: Secondary | ICD-10-CM | POA: Diagnosis not present

## 2018-02-15 DIAGNOSIS — M79672 Pain in left foot: Secondary | ICD-10-CM | POA: Diagnosis not present

## 2018-02-15 DIAGNOSIS — Z85528 Personal history of other malignant neoplasm of kidney: Secondary | ICD-10-CM | POA: Diagnosis not present

## 2018-02-15 DIAGNOSIS — M8580 Other specified disorders of bone density and structure, unspecified site: Secondary | ICD-10-CM | POA: Diagnosis not present

## 2018-02-15 DIAGNOSIS — R2681 Unsteadiness on feet: Secondary | ICD-10-CM | POA: Diagnosis not present

## 2018-02-22 ENCOUNTER — Ambulatory Visit (INDEPENDENT_AMBULATORY_CARE_PROVIDER_SITE_OTHER): Payer: Medicare Other | Admitting: Orthopaedic Surgery

## 2018-02-22 ENCOUNTER — Ambulatory Visit
Admission: RE | Admit: 2018-02-22 | Discharge: 2018-02-22 | Disposition: A | Discharge status: Home / Self Care | Payer: Medicare Other | Source: Ambulatory Visit | Attending: Internal Medicine | Admitting: Internal Medicine

## 2018-02-22 DIAGNOSIS — Z1231 Encounter for screening mammogram for malignant neoplasm of breast: Secondary | ICD-10-CM

## 2018-03-08 HISTORY — PX: HIP FRACTURE SURGERY: SHX118

## 2018-03-22 MED ORDER — METOCLOPRAMIDE HCL 5 MG PO TABS: 5.0000 mg | ORAL_TABLET | Freq: Three times a day (TID) | ORAL | 0 refills | Status: DC

## 2018-03-28 ENCOUNTER — Ambulatory Visit: Payer: Medicare Other | Admitting: Gastroenterology

## 2018-04-04 ENCOUNTER — Encounter (INDEPENDENT_AMBULATORY_CARE_PROVIDER_SITE_OTHER): Payer: Self-pay | Admitting: Orthopaedic Surgery

## 2018-04-05 ENCOUNTER — Ambulatory Visit (INDEPENDENT_AMBULATORY_CARE_PROVIDER_SITE_OTHER): Payer: Medicare Other | Admitting: Orthopaedic Surgery

## 2018-04-05 ENCOUNTER — Ambulatory Visit (INDEPENDENT_AMBULATORY_CARE_PROVIDER_SITE_OTHER): Payer: Medicare Other

## 2018-04-05 ENCOUNTER — Other Ambulatory Visit: Payer: Self-pay

## 2018-04-05 ENCOUNTER — Other Ambulatory Visit (INDEPENDENT_AMBULATORY_CARE_PROVIDER_SITE_OTHER): Payer: Self-pay

## 2018-04-05 ENCOUNTER — Encounter (INDEPENDENT_AMBULATORY_CARE_PROVIDER_SITE_OTHER): Payer: Self-pay | Admitting: Orthopaedic Surgery

## 2018-04-05 ENCOUNTER — Telehealth (INDEPENDENT_AMBULATORY_CARE_PROVIDER_SITE_OTHER): Payer: Self-pay | Admitting: Radiology

## 2018-04-05 ENCOUNTER — Ambulatory Visit
Admission: RE | Admit: 2018-04-05 | Discharge: 2018-04-05 | Disposition: A | Discharge status: Home / Self Care | Payer: Medicare Other | Source: Ambulatory Visit | Attending: Orthopaedic Surgery | Admitting: Orthopaedic Surgery

## 2018-04-05 ENCOUNTER — Encounter (HOSPITAL_COMMUNITY): Payer: Self-pay | Admitting: *Deleted

## 2018-04-05 ENCOUNTER — Other Ambulatory Visit (INDEPENDENT_AMBULATORY_CARE_PROVIDER_SITE_OTHER): Payer: Self-pay | Admitting: Physician Assistant

## 2018-04-05 DIAGNOSIS — S72042A Displaced fracture of base of neck of left femur, initial encounter for closed fracture: Secondary | ICD-10-CM | POA: Diagnosis not present

## 2018-04-05 DIAGNOSIS — M25552 Pain in left hip: Secondary | ICD-10-CM

## 2018-04-05 DIAGNOSIS — S72002A Fracture of unspecified part of neck of left femur, initial encounter for closed fracture: Secondary | ICD-10-CM

## 2018-04-05 NOTE — Progress Notes (Addendum)
Office Visit Note   Patient: [name]           Date of Birth: 10-31-1945           MRN: [id number] Visit Date: 04/05/2018              Requested by: Venia Carbon, MD [place], [place] [address] PCP: Venia Carbon, MD   Assessment & Plan: Visit Diagnoses:  1. Pain in left hip     Plan: We will obtain a CT scan of her left hip to rule out subcapital fracture.  Have her follow-up tomorrow to go over the results and discuss further treatment. Addendum: See CT scan of the left hip was performed and showed a subcapital left hip fracture with slight impaction otherwise nondisplaced.  Follow-Up Instructions: Return in about 1 day (around 04/06/2018).   Orders:  Orders Placed This Encounter  Procedures  . XR HIP UNILAT W OR W/O PELVIS 1V LEFT   No orders of the defined types were placed in this encounter.     Procedures: No procedures performed   Clinical Data: No additional findings.   Subjective: Chief Complaint  Patient presents with  . Left Hip - Pain    HPI [name] comes in today due to left hip pain.  She states that 9 days ago she fell when she hit a door and fell onto her left hip.  She denies any loss consciousness.  No dizziness chest pain.  No other injuries.  She is really been having a hard time unable to bear weight on the left hip mostly sitting throughout the day.  She is using a walker to ambulate in the home.  She has been taking Tylenol and meloxicam for the pain states that this helps some.  Review of Systems See HPI otherwise negative  Objective: Vital Signs: There were no vitals taken for this visit.  Physical Exam Constitutional:      Appearance: Normal appearance. She is not ill-appearing or diaphoretic.  Cardiovascular:     Pulses: Normal pulses.  Pulmonary:     Effort: Pulmonary effort is normal.  Neurological:     Mental Status: She is alert and oriented to person, place, and time.     Ortho  Exam Bilateral hips full range of motion of the right hip without pain left hip she has severe pain with any attempted internal and external rotation.  Tenderness over the lateral left hip proximal trochanteric region.  Calves are supple nontender bilaterally.  She has a laceration over the distal one third right lower leg no signs of infection.   Nontender remainder bilateral lower legs. Dorsiflexion plantarflexion intact bilateral ankles. Specialty Comments:  No specialty comments available.  Imaging: Xr Hip Unilat W Or W/o Pelvis 1v Left  Result Date: 04/05/2018 AP pelvis lateral view left hip: Bilateral hips well located hips are overall well-maintained.  Right hip no acute fractures no bone abnormalities.  Subtle subcapital irregularity in the cortices of left hip suspicious for subcapital hip fracture nondisplaced.    PMFS History: Patient Active Problem List   Diagnosis Date Noted  . Acute respiratory infection 12/23/2017  . Left hip pain 11/08/2017  . CKD stage 3 due to type 2 diabetes mellitus ([place]) 01/26/2017  . Advance directive discussed with patient 05/02/2015  . Rectocele 05/01/2015  . Uterine prolapse 05/01/2015  . Vascular dementia without behavioral disturbance ([place])   . Routine general medical examination at a health  care facility 08/10/2010  . Type 2 diabetes, controlled, with neuropathy ([place]) 05/16/2009  . OSTEOARTHRITIS 05/16/2009  . DIVERTICULOSIS-COLON 12/23/2008  . COPD (chronic obstructive pulmonary disease) ([place]) 02/15/2008  . HYPERLIPIDEMIA 12/22/2007  . Episodic mood disorder ([place]) 12/22/2007  . ALLERGIC RHINITIS 12/22/2007  . Multiple pulmonary nodules 12/22/2007  . GERD 12/22/2007  . BREAST CANCER, HX OF 12/22/2007  . COLONIC POLYPS, HX OF 12/22/2007   Past Medical History:  Diagnosis Date  . Anxiety   . Asthma   . Breast cancer [facility])    h/o- radiation  . Carcinoid tumor of lung 1991  . COPD (chronic obstructive pulmonary disease) ([place])   .  Depression   . Diabetes mellitus type II   . Diverticulosis   . Gastroparesis   . GERD (gastroesophageal reflux disease)   . History of adenomatous polyp of colon   . History of breast cancer   . Hyperlipidemia   . Mild cognitive impairment   . Osteoarthritis   . Pulmonary nodule    multiple  . Renal cell carcinoma     Family History  Problem Relation Age of Onset  . Colon cancer Father   . Lung cancer Father   . Stroke Mother   . Breast cancer Neg Hx   . Ovarian cancer Neg Hx   . Diabetes Neg Hx     Past Surgical History:  Procedure Laterality Date  . BREAST LUMPECTOMY  1998   right breast  . CHOLECYSTECTOMY  1975  . LAPAROSCOPIC NEPHRECTOMY  01/2009   right  . LUNG SURGERY  1994   carcinoid removal  . TONSILLECTOMY AND ADENOIDECTOMY  1953   Social History   Occupational History  . Occupation: retired Designer, jewellery: RETIRED  Tobacco Use  . Smoking status: Former Smoker    Packs/day: 1.00    Years: 30.00    Pack years: 30.00    Types: Cigarettes    Last attempt to quit: 03/08/1993    Years since quitting: 25.0  . Smokeless tobacco: Never Used  Substance and Sexual Activity  . Alcohol use: No  . Drug use: No  . Sexual activity: Not Currently    Birth control/protection: Post-menopausal

## 2018-04-05 NOTE — Telephone Encounter (Signed)
[facility] Radiology called states that CT scan of Left hip showed minimial impacted subcapital left femoral neck fracture.

## 2018-04-05 NOTE — Progress Notes (Signed)
Need orders in epic.

## 2018-04-05 NOTE — Anesthesia Preprocedure Evaluation (Addendum)
Anesthesia Evaluation  Patient identified by MRN, date of birth, ID band Patient awake    Reviewed: Allergy & Precautions, NPO status , Patient's Chart, lab work & pertinent test results  Airway Mallampati: I  TM Distance: >3 FB Neck ROM: Full    Dental no notable dental hx. (+) Lower Dentures, Upper Dentures   Pulmonary asthma , COPD,  COPD inhaler, former smoker,   On R  Pulmonary exam normal + rhonchi        Cardiovascular Normal cardiovascular exam Rhythm:Regular Rate:Normal  04/06/2018 EKG NSR R87 w nsst Changes   Neuro/Psych PSYCHIATRIC DISORDERS Depression Dementia    GI/Hepatic Neg liver ROS, GERD  ,  Endo/Other  diabetes, Type 2  Renal/GU      Musculoskeletal   Abdominal   Peds  Hematology Hgb 11.1   Anesthesia Other Findings   Reproductive/Obstetrics                            Anesthesia Physical Anesthesia Plan  ASA: III  Anesthesia Plan: Spinal   Post-op Pain Management:    Induction:   PONV Risk Score and Plan: Treatment may vary due to age or medical condition, Ondansetron and Dexamethasone  Airway Management Planned: Nasal Cannula and Natural Airway  Additional Equipment:   Intra-op Plan:   Post-operative Plan:   Informed Consent: I have reviewed the patients History and Physical, chart, labs and discussed the procedure including the risks, benefits and alternatives for the proposed anesthesia with the patient or authorized representative who has indicated his/her understanding and acceptance.     Dental advisory given  Plan Discussed with: CRNA  Anesthesia Plan Comments: (L hip Pinning w spinal hypobaric)     Anesthesia Quick Evaluation

## 2018-04-05 NOTE — Telephone Encounter (Signed)
See below

## 2018-04-06 ENCOUNTER — Inpatient Hospital Stay (HOSPITAL_COMMUNITY)
Admission: RE | Admit: 2018-04-06 | Discharge: 2018-04-09 | DRG: 481 | Disposition: A | Discharge status: Home Health | Payer: Medicare Other | Attending: Orthopaedic Surgery | Admitting: Orthopaedic Surgery

## 2018-04-06 ENCOUNTER — Encounter (HOSPITAL_COMMUNITY): Payer: Self-pay | Admitting: Certified Registered Nurse Anesthetist

## 2018-04-06 ENCOUNTER — Inpatient Hospital Stay (HOSPITAL_COMMUNITY): Payer: Medicare Other

## 2018-04-06 ENCOUNTER — Inpatient Hospital Stay (HOSPITAL_COMMUNITY): Payer: Medicare Other | Admitting: Anesthesiology

## 2018-04-06 ENCOUNTER — Other Ambulatory Visit: Payer: Self-pay

## 2018-04-06 ENCOUNTER — Encounter (HOSPITAL_COMMUNITY)
Admission: RE | Disposition: A | Discharge status: Home Health | Payer: Self-pay | Source: Home / Self Care | Attending: Orthopaedic Surgery

## 2018-04-06 DIAGNOSIS — S72002A Fracture of unspecified part of neck of left femur, initial encounter for closed fracture: Secondary | ICD-10-CM | POA: Diagnosis not present

## 2018-04-06 DIAGNOSIS — Z7951 Long term (current) use of inhaled steroids: Secondary | ICD-10-CM | POA: Diagnosis not present

## 2018-04-06 DIAGNOSIS — Z905 Acquired absence of kidney: Secondary | ICD-10-CM | POA: Diagnosis not present

## 2018-04-06 DIAGNOSIS — F0390 Unspecified dementia without behavioral disturbance: Secondary | ICD-10-CM | POA: Diagnosis present

## 2018-04-06 DIAGNOSIS — S72012A Unspecified intracapsular fracture of left femur, initial encounter for closed fracture: Secondary | ICD-10-CM | POA: Diagnosis present

## 2018-04-06 DIAGNOSIS — Z87891 Personal history of nicotine dependence: Secondary | ICD-10-CM

## 2018-04-06 DIAGNOSIS — F329 Major depressive disorder, single episode, unspecified: Secondary | ICD-10-CM | POA: Diagnosis present

## 2018-04-06 DIAGNOSIS — M1990 Unspecified osteoarthritis, unspecified site: Secondary | ICD-10-CM | POA: Diagnosis present

## 2018-04-06 DIAGNOSIS — Z800 Family history of malignant neoplasm of digestive organs: Secondary | ICD-10-CM

## 2018-04-06 DIAGNOSIS — Z419 Encounter for procedure for purposes other than remedying health state, unspecified: Secondary | ICD-10-CM

## 2018-04-06 DIAGNOSIS — K219 Gastro-esophageal reflux disease without esophagitis: Secondary | ICD-10-CM | POA: Diagnosis present

## 2018-04-06 DIAGNOSIS — E785 Hyperlipidemia, unspecified: Secondary | ICD-10-CM | POA: Diagnosis present

## 2018-04-06 DIAGNOSIS — D62 Acute posthemorrhagic anemia: Secondary | ICD-10-CM | POA: Diagnosis not present

## 2018-04-06 DIAGNOSIS — Z801 Family history of malignant neoplasm of trachea, bronchus and lung: Secondary | ICD-10-CM | POA: Diagnosis not present

## 2018-04-06 DIAGNOSIS — N183 Chronic kidney disease, stage 3 (moderate): Secondary | ICD-10-CM | POA: Diagnosis not present

## 2018-04-06 DIAGNOSIS — W1830XA Fall on same level, unspecified, initial encounter: Secondary | ICD-10-CM | POA: Diagnosis present

## 2018-04-06 DIAGNOSIS — Z86010 Personal history of colonic polyps: Secondary | ICD-10-CM

## 2018-04-06 DIAGNOSIS — Z7984 Long term (current) use of oral hypoglycemic drugs: Secondary | ICD-10-CM | POA: Diagnosis not present

## 2018-04-06 DIAGNOSIS — Z85528 Personal history of other malignant neoplasm of kidney: Secondary | ICD-10-CM | POA: Diagnosis not present

## 2018-04-06 DIAGNOSIS — E1122 Type 2 diabetes mellitus with diabetic chronic kidney disease: Secondary | ICD-10-CM | POA: Diagnosis not present

## 2018-04-06 DIAGNOSIS — Z9049 Acquired absence of other specified parts of digestive tract: Secondary | ICD-10-CM | POA: Diagnosis not present

## 2018-04-06 DIAGNOSIS — Z79899 Other long term (current) drug therapy: Secondary | ICD-10-CM

## 2018-04-06 DIAGNOSIS — E1143 Type 2 diabetes mellitus with diabetic autonomic (poly)neuropathy: Secondary | ICD-10-CM | POA: Diagnosis present

## 2018-04-06 DIAGNOSIS — Z853 Personal history of malignant neoplasm of breast: Secondary | ICD-10-CM | POA: Diagnosis not present

## 2018-04-06 DIAGNOSIS — M25552 Pain in left hip: Secondary | ICD-10-CM | POA: Diagnosis not present

## 2018-04-06 DIAGNOSIS — R062 Wheezing: Secondary | ICD-10-CM

## 2018-04-06 DIAGNOSIS — Z823 Family history of stroke: Secondary | ICD-10-CM

## 2018-04-06 DIAGNOSIS — J441 Chronic obstructive pulmonary disease with (acute) exacerbation: Secondary | ICD-10-CM | POA: Diagnosis not present

## 2018-04-06 DIAGNOSIS — F419 Anxiety disorder, unspecified: Secondary | ICD-10-CM | POA: Diagnosis present

## 2018-04-06 DIAGNOSIS — R0602 Shortness of breath: Secondary | ICD-10-CM | POA: Diagnosis not present

## 2018-04-06 HISTORY — DX: Pneumonia, unspecified organism: J18.9

## 2018-04-06 HISTORY — DX: Dyspnea, unspecified: R06.00

## 2018-04-06 HISTORY — PX: HIP PINNING,CANNULATED: SHX1758

## 2018-04-06 LAB — BASIC METABOLIC PANEL
Anion gap: 9 (ref 5–15)
BUN: 18 mg/dL (ref 8–23)
CO2: 25 mmol/L (ref 22–32)
Calcium: 8.7 mg/dL — ABNORMAL LOW (ref 8.9–10.3)
Chloride: 104 mmol/L (ref 98–111)
Creatinine, Ser: 1.36 mg/dL — ABNORMAL HIGH (ref 0.44–1.00)
GFR calc Af Amer: 44 mL/min — ABNORMAL LOW (ref >60)
GFR calc non Af Amer: 38 mL/min — ABNORMAL LOW (ref >60)
Glucose, Bld: 116 mg/dL — ABNORMAL HIGH (ref 70–99)
Potassium: 5.1 mmol/L (ref 3.5–5.1)
SODIUM: 138 mmol/L (ref 135–145)

## 2018-04-06 LAB — CBC
HCT: 38.1 % (ref 36.0–46.0)
Hemoglobin: 11.1 g/dL — ABNORMAL LOW (ref 12.0–15.0)
MCH: 26.4 pg (ref 26.0–34.0)
MCHC: 29.1 g/dL — ABNORMAL LOW (ref 30.0–36.0)
MCV: 90.5 fL (ref 80.0–100.0)
Platelets: 360 10*3/uL (ref 150–400)
RBC: 4.21 MIL/uL (ref 3.87–5.11)
RDW: 15.3 % (ref 11.5–15.5)
WBC: 16.4 10*3/uL — ABNORMAL HIGH (ref 4.0–10.5)
nRBC: 0 % (ref 0.0–0.2)

## 2018-04-06 LAB — HEMOGLOBIN A1C
HEMOGLOBIN A1C: 8 % — AB (ref 4.8–5.6)
Mean Plasma Glucose: 182.9 mg/dL

## 2018-04-06 LAB — GLUCOSE, CAPILLARY: Glucose-Capillary: 96 mg/dL (ref 70–99)

## 2018-04-06 SURGERY — FIXATION, FEMUR, NECK, PERCUTANEOUS, USING SCREW
Anesthesia: Spinal | Laterality: Left

## 2018-04-06 MED ORDER — ACETAMINOPHEN 325 MG PO TABS
325.0000 mg | ORAL_TABLET | Freq: Four times a day (QID) | ORAL | Status: DC | PRN
  Administered 2018-04-08 – 2018-04-09 (×2): 650 mg via ORAL
  Filled 2018-04-06 (×2): qty 2

## 2018-04-06 MED ORDER — ASPIRIN EC 325 MG PO TBEC
325.0000 mg | DELAYED_RELEASE_TABLET | Freq: Every day | ORAL | Status: DC
  Administered 2018-04-07 – 2018-04-09 (×3): 325 mg via ORAL
  Filled 2018-04-06 (×3): qty 1

## 2018-04-06 MED ORDER — METHOCARBAMOL 500 MG IVPB - SIMPLE MED
500.0000 mg | Freq: Four times a day (QID) | INTRAVENOUS | Status: DC | PRN
  Administered 2018-04-06: 500 mg via INTRAVENOUS
  Filled 2018-04-06: qty 50

## 2018-04-06 MED ORDER — POLYETHYLENE GLYCOL 3350 17 GM/SCOOP PO POWD: 17.0000 g | Freq: Every day | ORAL | Status: DC | PRN

## 2018-04-06 MED ORDER — DOCUSATE SODIUM 100 MG PO CAPS
100.0000 mg | ORAL_CAPSULE | Freq: Two times a day (BID) | ORAL | Status: DC
  Administered 2018-04-06 – 2018-04-09 (×5): 100 mg via ORAL
  Filled 2018-04-06 (×5): qty 1

## 2018-04-06 MED ORDER — PHENYLEPHRINE 40 MCG/ML (10ML) SYRINGE FOR IV PUSH (FOR BLOOD PRESSURE SUPPORT)
PREFILLED_SYRINGE | INTRAVENOUS | Status: DC | PRN
  Administered 2018-04-06: 80 ug via INTRAVENOUS
  Administered 2018-04-06: 120 ug via INTRAVENOUS
  Administered 2018-04-06: 80 ug via INTRAVENOUS
  Administered 2018-04-06: 40 ug via INTRAVENOUS
  Administered 2018-04-06: 80 ug via INTRAVENOUS

## 2018-04-06 MED ORDER — PROPOFOL 10 MG/ML IV BOLUS
INTRAVENOUS | Status: AC
  Filled 2018-04-06: qty 20

## 2018-04-06 MED ORDER — FENTANYL CITRATE (PF) 100 MCG/2ML IJ SOLN
INTRAMUSCULAR | Status: AC
  Filled 2018-04-06: qty 2

## 2018-04-06 MED ORDER — 0.9 % SODIUM CHLORIDE (POUR BTL) OPTIME
TOPICAL | Status: DC | PRN
  Administered 2018-04-06: 1000 mL

## 2018-04-06 MED ORDER — CLINDAMYCIN PHOSPHATE 900 MG/50ML IV SOLN
INTRAVENOUS | Status: AC
  Filled 2018-04-06: qty 50

## 2018-04-06 MED ORDER — FENTANYL CITRATE (PF) 100 MCG/2ML IJ SOLN
25.0000 ug | INTRAMUSCULAR | Status: DC | PRN
  Administered 2018-04-06 (×2): 50 ug via INTRAVENOUS

## 2018-04-06 MED ORDER — ACETAMINOPHEN 10 MG/ML IV SOLN: 1000.0000 mg | Freq: Once | INTRAVENOUS | Status: DC | PRN

## 2018-04-06 MED ORDER — UMECLIDINIUM BROMIDE 62.5 MCG/INH IN AEPB
1.0000 | INHALATION_SPRAY | Freq: Every day | RESPIRATORY_TRACT | Status: DC
  Administered 2018-04-07 – 2018-04-09 (×3): 1 via RESPIRATORY_TRACT
  Filled 2018-04-06: qty 7

## 2018-04-06 MED ORDER — TIOTROPIUM BROMIDE MONOHYDRATE 18 MCG IN CAPS
18.0000 ug | ORAL_CAPSULE | Freq: Every day | RESPIRATORY_TRACT | Status: DC
  Filled 2018-04-06: qty 5

## 2018-04-06 MED ORDER — MOMETASONE FURO-FORMOTEROL FUM 200-5 MCG/ACT IN AERO
2.0000 | INHALATION_SPRAY | Freq: Two times a day (BID) | RESPIRATORY_TRACT | Status: DC
  Administered 2018-04-06 – 2018-04-09 (×6): 2 via RESPIRATORY_TRACT
  Filled 2018-04-06: qty 8.8

## 2018-04-06 MED ORDER — MORPHINE SULFATE (PF) 4 MG/ML IV SOLN: 0.5000 mg | INTRAVENOUS | Status: DC | PRN

## 2018-04-06 MED ORDER — PROPOFOL 10 MG/ML IV BOLUS
INTRAVENOUS | Status: AC
  Filled 2018-04-06: qty 60

## 2018-04-06 MED ORDER — DEXAMETHASONE SODIUM PHOSPHATE 10 MG/ML IJ SOLN
INTRAMUSCULAR | Status: DC | PRN
  Administered 2018-04-06: 10 mg via INTRAVENOUS

## 2018-04-06 MED ORDER — METHOCARBAMOL 500 MG PO TABS
500.0000 mg | ORAL_TABLET | Freq: Four times a day (QID) | ORAL | Status: DC | PRN
  Administered 2018-04-07 – 2018-04-09 (×3): 500 mg via ORAL
  Filled 2018-04-06 (×4): qty 1

## 2018-04-06 MED ORDER — SODIUM CHLORIDE 0.9 % IV SOLN
INTRAVENOUS | Status: DC
  Administered 2018-04-06 – 2018-04-07 (×2): via INTRAVENOUS

## 2018-04-06 MED ORDER — VITAMIN D 25 MCG (1000 UNIT) PO TABS
1000.0000 [IU] | ORAL_TABLET | Freq: Every day | ORAL | Status: DC
  Administered 2018-04-07 – 2018-04-09 (×3): 1000 [IU] via ORAL
  Filled 2018-04-06 (×3): qty 1

## 2018-04-06 MED ORDER — BISMUTH SUBSALICYLATE 262 MG/15ML PO SUSP
30.0000 mL | Freq: Four times a day (QID) | ORAL | Status: DC | PRN
  Administered 2018-04-08: 30 mL via ORAL
  Filled 2018-04-06: qty 236

## 2018-04-06 MED ORDER — POLYETHYLENE GLYCOL 3350 17 G PO PACK: 17.0000 g | PACK | Freq: Every day | ORAL | Status: DC | PRN

## 2018-04-06 MED ORDER — ONDANSETRON HCL 4 MG/2ML IJ SOLN: 4.0000 mg | Freq: Once | INTRAMUSCULAR | Status: DC | PRN

## 2018-04-06 MED ORDER — METHOCARBAMOL 500 MG IVPB - SIMPLE MED
INTRAVENOUS | Status: AC
  Filled 2018-04-06: qty 50

## 2018-04-06 MED ORDER — ONDANSETRON HCL 4 MG/2ML IJ SOLN
INTRAMUSCULAR | Status: DC | PRN
  Administered 2018-04-06: 4 mg via INTRAVENOUS

## 2018-04-06 MED ORDER — PRAVASTATIN SODIUM 40 MG PO TABS
40.0000 mg | ORAL_TABLET | Freq: Every day | ORAL | Status: DC
  Administered 2018-04-07 – 2018-04-09 (×3): 40 mg via ORAL
  Filled 2018-04-06: qty 1
  Filled 2018-04-06: qty 2
  Filled 2018-04-06: qty 1
  Filled 2018-04-06 (×2): qty 2
  Filled 2018-04-06: qty 1

## 2018-04-06 MED ORDER — MIDAZOLAM HCL 2 MG/2ML IJ SOLN
INTRAMUSCULAR | Status: AC
  Filled 2018-04-06: qty 2

## 2018-04-06 MED ORDER — METFORMIN HCL 500 MG PO TABS
500.0000 mg | ORAL_TABLET | Freq: Two times a day (BID) | ORAL | Status: DC
  Administered 2018-04-07 – 2018-04-09 (×5): 500 mg via ORAL
  Filled 2018-04-06 (×5): qty 1

## 2018-04-06 MED ORDER — ALBUTEROL SULFATE (2.5 MG/3ML) 0.083% IN NEBU
2.5000 mg | INHALATION_SOLUTION | Freq: Four times a day (QID) | RESPIRATORY_TRACT | Status: DC | PRN
  Administered 2018-04-06: 2.5 mg via RESPIRATORY_TRACT

## 2018-04-06 MED ORDER — ALBUTEROL SULFATE (2.5 MG/3ML) 0.083% IN NEBU
2.5000 mg | INHALATION_SOLUTION | Freq: Four times a day (QID) | RESPIRATORY_TRACT | Status: DC | PRN
  Administered 2018-04-08: 2.5 mg via RESPIRATORY_TRACT
  Filled 2018-04-06: qty 3

## 2018-04-06 MED ORDER — MENTHOL 3 MG MT LOZG: 1.0000 | LOZENGE | OROMUCOSAL | Status: DC | PRN

## 2018-04-06 MED ORDER — PROPOFOL 500 MG/50ML IV EMUL
INTRAVENOUS | Status: DC | PRN
  Administered 2018-04-06: 75 ug/kg/min via INTRAVENOUS

## 2018-04-06 MED ORDER — CHLORHEXIDINE GLUCONATE 4 % EX LIQD: 60.0000 mL | Freq: Once | CUTANEOUS | Status: DC

## 2018-04-06 MED ORDER — SODIUM CHLORIDE 0.9 % IV SOLN
INTRAVENOUS | Status: DC | PRN
  Administered 2018-04-06: 20 ug/min via INTRAVENOUS

## 2018-04-06 MED ORDER — HYDROCODONE-ACETAMINOPHEN 5-325 MG PO TABS
1.0000 | ORAL_TABLET | ORAL | Status: DC | PRN
  Administered 2018-04-06: 2 via ORAL
  Administered 2018-04-07: 1 via ORAL
  Administered 2018-04-07: 2 via ORAL
  Administered 2018-04-08 – 2018-04-09 (×3): 1 via ORAL
  Filled 2018-04-06 (×4): qty 1
  Filled 2018-04-06 (×2): qty 2

## 2018-04-06 MED ORDER — LACTATED RINGERS IV SOLN
INTRAVENOUS | Status: DC | PRN
  Administered 2018-04-06: 10:00:00 via INTRAVENOUS

## 2018-04-06 MED ORDER — ONDANSETRON HCL 4 MG PO TABS: 4.0000 mg | ORAL_TABLET | Freq: Four times a day (QID) | ORAL | Status: DC | PRN

## 2018-04-06 MED ORDER — ACETAMINOPHEN 500 MG PO TABS: 500.0000 mg | ORAL_TABLET | Freq: Four times a day (QID) | ORAL | Status: DC | PRN

## 2018-04-06 MED ORDER — ONDANSETRON HCL 4 MG/2ML IJ SOLN: 4.0000 mg | Freq: Four times a day (QID) | INTRAMUSCULAR | Status: DC | PRN

## 2018-04-06 MED ORDER — DEXAMETHASONE SODIUM PHOSPHATE 10 MG/ML IJ SOLN
INTRAMUSCULAR | Status: AC
  Filled 2018-04-06: qty 1

## 2018-04-06 MED ORDER — HYDROCODONE-ACETAMINOPHEN 7.5-325 MG PO TABS
1.0000 | ORAL_TABLET | ORAL | Status: DC | PRN
  Administered 2018-04-06 – 2018-04-07 (×2): 1 via ORAL
  Administered 2018-04-09: 2 via ORAL
  Filled 2018-04-06: qty 1
  Filled 2018-04-06: qty 2
  Filled 2018-04-06: qty 1

## 2018-04-06 MED ORDER — ALBUTEROL SULFATE (2.5 MG/3ML) 0.083% IN NEBU
INHALATION_SOLUTION | RESPIRATORY_TRACT | Status: AC
  Filled 2018-04-06: qty 3

## 2018-04-06 MED ORDER — BUPIVACAINE HCL (PF) 0.5 % IJ SOLN
INTRAMUSCULAR | Status: DC | PRN
  Administered 2018-04-06: 1.8 mL via INTRATHECAL

## 2018-04-06 MED ORDER — CLINDAMYCIN PHOSPHATE 900 MG/50ML IV SOLN
900.0000 mg | INTRAVENOUS | Status: AC
  Administered 2018-04-06: 900 mg via INTRAVENOUS

## 2018-04-06 MED ORDER — CLINDAMYCIN PHOSPHATE 600 MG/50ML IV SOLN
600.0000 mg | Freq: Four times a day (QID) | INTRAVENOUS | Status: AC
  Administered 2018-04-06 (×2): 600 mg via INTRAVENOUS
  Filled 2018-04-06 (×2): qty 50

## 2018-04-06 MED ORDER — METOCLOPRAMIDE HCL 5 MG/ML IJ SOLN: 5.0000 mg | Freq: Three times a day (TID) | INTRAMUSCULAR | Status: DC | PRN

## 2018-04-06 MED ORDER — PREGABALIN 75 MG PO CAPS
75.0000 mg | ORAL_CAPSULE | Freq: Two times a day (BID) | ORAL | Status: DC
  Administered 2018-04-06 – 2018-04-09 (×6): 75 mg via ORAL
  Filled 2018-04-06 (×6): qty 1

## 2018-04-06 MED ORDER — PHENYLEPHRINE HCL 10 MG/ML IJ SOLN
INTRAMUSCULAR | Status: AC
  Filled 2018-04-06: qty 1

## 2018-04-06 MED ORDER — PHENYLEPHRINE 40 MCG/ML (10ML) SYRINGE FOR IV PUSH (FOR BLOOD PRESSURE SUPPORT)
PREFILLED_SYRINGE | INTRAVENOUS | Status: AC
  Filled 2018-04-06: qty 10

## 2018-04-06 MED ORDER — ONDANSETRON HCL 4 MG/2ML IJ SOLN
INTRAMUSCULAR | Status: AC
  Filled 2018-04-06: qty 2

## 2018-04-06 MED ORDER — METOCLOPRAMIDE HCL 5 MG PO TABS: 5.0000 mg | ORAL_TABLET | Freq: Three times a day (TID) | ORAL | Status: DC | PRN

## 2018-04-06 MED ORDER — PHENOL 1.4 % MT LIQD: 1.0000 | OROMUCOSAL | Status: DC | PRN

## 2018-04-06 SURGICAL SUPPLY — 50 items
BAG SPEC THK2 15X12 ZIP CLS (MISCELLANEOUS) ×1
BAG ZIPLOCK 12X15 (MISCELLANEOUS) ×3 IMPLANT
BIT DRILL 4.9 CANNULATED (BIT) ×1
BIT DRILL CANN QC 4.9 LRG (BIT) IMPLANT
BNDG GAUZE ELAST 4 BULKY (GAUZE/BANDAGES/DRESSINGS) ×3 IMPLANT
COVER PERINEAL POST (MISCELLANEOUS) ×2 IMPLANT
COVER SURGICAL LIGHT HANDLE (MISCELLANEOUS) ×3 IMPLANT
COVER WAND RF STERILE (DRAPES) ×2 IMPLANT
DRAPE STERI IOBAN 125X83 (DRAPES) ×3 IMPLANT
DRILL BIT CANNULATED 4.9 (BIT) ×3
DRSG AQUACEL AG ADV 3.5X 4 (GAUZE/BANDAGES/DRESSINGS) ×2 IMPLANT
DRSG PAD ABDOMINAL 8X10 ST (GAUZE/BANDAGES/DRESSINGS) ×1 IMPLANT
DRSG TEGADERM 4X4.75 (GAUZE/BANDAGES/DRESSINGS) IMPLANT
DURAPREP 26ML APPLICATOR (WOUND CARE) ×3 IMPLANT
ELECT REM PT RETURN 15FT ADLT (MISCELLANEOUS) ×3 IMPLANT
FACESHIELD WRAPAROUND (MASK) ×9 IMPLANT
FACESHIELD WRAPAROUND OR TEAM (MASK) IMPLANT
GAUZE SPONGE 4X4 12PLY STRL (GAUZE/BANDAGES/DRESSINGS) ×1 IMPLANT
GAUZE XEROFORM 1X8 LF (GAUZE/BANDAGES/DRESSINGS) ×2 IMPLANT
GAUZE XEROFORM 5X9 LF (GAUZE/BANDAGES/DRESSINGS) ×3 IMPLANT
GLOVE BIO SURGEON STRL SZ7.5 (GLOVE) ×3 IMPLANT
GLOVE BIOGEL PI IND STRL 7.5 (GLOVE) IMPLANT
GLOVE BIOGEL PI IND STRL 8 (GLOVE) ×1 IMPLANT
GLOVE BIOGEL PI INDICATOR 7.5 (GLOVE) ×6
GLOVE BIOGEL PI INDICATOR 8 (GLOVE) ×4
GLOVE ECLIPSE 8.0 STRL XLNG CF (GLOVE) ×3 IMPLANT
GOWN SPEC L4 XLG W/TWL (GOWN DISPOSABLE) ×2 IMPLANT
GOWN STRL REUS W/TWL XL LVL3 (GOWN DISPOSABLE) ×5 IMPLANT
GUIDEWIRE ASNIS 3.2 NONCAL (WIRE) ×6 IMPLANT
HUMERAL HEAD STERILE 54MMX22MM (MISCELLANEOUS) IMPLANT
NS IRRIG 1000ML POUR BTL (IV SOLUTION) ×3 IMPLANT
PACK GENERAL/GYN (CUSTOM PROCEDURE TRAY) ×3 IMPLANT
PAD CAST 4YDX4 CTTN HI CHSV (CAST SUPPLIES) ×1 IMPLANT
PADDING CAST COTTON 4X4 STRL (CAST SUPPLIES)
PROTECTOR NERVE ULNAR (MISCELLANEOUS) ×3 IMPLANT
SCREW ASNIS 75MM (Screw) ×2 IMPLANT
SCREW ASNIS 85MM (Screw) ×4 IMPLANT
SCREW ASNIS 90MM (Screw) ×2 IMPLANT
SPONGE LAP 18X18 RF (DISPOSABLE) IMPLANT
STAPLER VISISTAT 35W (STAPLE) ×3 IMPLANT
SUT VIC AB 0 CT1 27 (SUTURE) ×3
SUT VIC AB 0 CT1 27XBRD ANTBC (SUTURE) ×1 IMPLANT
SUT VIC AB 1 CT1 27 (SUTURE) ×6
SUT VIC AB 1 CT1 27XBRD ANTBC (SUTURE) ×2 IMPLANT
SUT VIC AB 2-0 CT1 27 (SUTURE) ×3
SUT VIC AB 2-0 CT1 TAPERPNT 27 (SUTURE) ×1 IMPLANT
TOWEL OR 17X26 10 PK STRL BLUE (TOWEL DISPOSABLE) ×4 IMPLANT
TRAY FOLEY CATH 14FRSI W/METER (CATHETERS) ×2 IMPLANT
TRAY FOLEY MTR SLVR 16FR STAT (SET/KITS/TRAYS/PACK) IMPLANT
WATER STERILE IRR 1000ML POUR (IV SOLUTION) ×3 IMPLANT

## 2018-04-06 NOTE — Op Note (Signed)
NAME: [name], [name] MEDICAL RECORD IR:[id number] ACCOUNT [id number] DATE OF BIRTH:1946/02/16 FACILITY: [facility] LOCATION: [facility] PHYSICIAN:Leela Vanbrocklin Kerry Fort, MD  OPERATIVE REPORT  DATE OF PROCEDURE:  04/06/2018  PREOPERATIVE DIAGNOSIS:  Left hip nondisplaced femoral neck fracture.  POSTOPERATIVE DIAGNOSIS:  Left hip nondisplaced femoral neck fracture.  PROCEDURE:  Cannulated screw fixation/percutaneous pinning of left hip nondisplaced femoral neck fracture.  IMPLANTS:  Stryker 6.5 mm stainless steel cannulated screws x3.  SURGEON:  Lind Guest. Ninfa Linden, MD  ASSISTANT:  Benita Stabile, PA-C   ANESTHESIA:  Spinal.  ANTIBIOTICS:  900 mg IV clindamycin.  ESTIMATED BLOOD LOSS:  Less than 100 mL.  COMPLICATIONS:  None.  INDICATIONS:  The patient is a very pleasant 73 year old female with some dementia.  She had a mechanical fall about a week ago.  She has been walking around on her left hip.  It got to where she did not want to put weight on her hip when we saw her in  the office yesterday.  Plain films showed the possibility of a femoral neck fracture.  Based on the pain she was having on extremes of rotation, we felt that was the case.  We still sent her for a CT scan, and it did confirm a nondisplaced femoral neck  fracture.  I have recommended cannulated screw fixation of this fracture since it is certainly stable.  She does appear to have strong-appearing bone as well.  I talked to her family at length about this, and they understand the rationale behind  proceeding with surgery.  DESCRIPTION OF PROCEDURE:  After informed consent was obtained and appropriate left hip was marked, she was brought to the operating room and rolled on her side on a stretcher where spinal anesthesia was obtained.  She was then placed in supine position  on the fracture table, the perineal post in place, her left leg in in-line skeletal traction, but no traction applied and the right hip in an  abduction stirrup with appropriate padding in the popliteal area.  We then assessed her fracture under direct  fluoroscopy, and it remained nondisplaced.  We then prepped her left hip with DuraPrep and sterile drapes.  A time-out was called identifying correct patient, correct left hip.  I then made a small incision over the lateral aspect of her femur.  We  placed 3 temporary guide pins in an inverted triangle format in what we felt was the best bone quality.  Unfortunately, along the inferior femoral neck, she does have a large cyst.  Once we placed those 3 screws, we placed the 3 guide pins.  We took  measurements off of these.  This was all done under fluoroscopy.  We then placed our 3 cannulated screws without difficulty.  I then put the hip through internal and external rotation, and it stills moved as a unit.  We then irrigated the soft tissue  with normal saline solution.  We closed the deep tissue with 0 Vicryl, followed by 2-0 Vicryl in the subcutaneous tissue and interrupted staples on the skin.  An Aquacel dressing was applied.  She was taken off the fracture table and taken to recovery  room in stable condition.  All final counts were correct.  No complications noted.  Note Benita Stabile, PA-C, assisted the entire case.  His assistance was crucial for facilitating all aspects of this case.  LN/NUANCE  D:04/06/2018 T:04/06/2018 JOB:[id number]

## 2018-04-06 NOTE — H&P (Signed)
[name] is an 73 y.o. female.   Chief Complaint: left hip pain; known fracture HPI:  73 yo female who sustained a non-displaced left hip fracture following a mechanical fall a week ago.  She has been able to walk, but with significant pain.  She was brought to our office yesterday and found to have a non-displaced left femoral neck fracture.  Surgery has been recommended.  Past Medical History:  Diagnosis Date  . Anxiety   . Asthma   . Breast cancer [facility])    h/o- radiation  . Carcinoid tumor of lung 1991  . COPD (chronic obstructive pulmonary disease) ([place])   . Depression   . Diabetes mellitus type II   . Diverticulosis   . Dyspnea    with exertion   . Gastroparesis   . GERD (gastroesophageal reflux disease)   . History of adenomatous polyp of colon   . History of breast cancer   . Hyperlipidemia   . Mild cognitive impairment   . Osteoarthritis   . Pneumonia   . Pulmonary nodule    multiple  . Renal cell carcinoma     Past Surgical History:  Procedure Laterality Date  . BREAST LUMPECTOMY  1998   right breast  . CHOLECYSTECTOMY  1975  . LAPAROSCOPIC NEPHRECTOMY  01/2009   right  . LUNG SURGERY  1994   carcinoid removal  . TONSILLECTOMY AND ADENOIDECTOMY  1953    Family History  Problem Relation Age of Onset  . Colon cancer Father   . Lung cancer Father   . Stroke Mother   . Breast cancer Neg Hx   . Ovarian cancer Neg Hx   . Diabetes Neg Hx    Social History:  reports that she quit smoking about 25 years ago. Her smoking use included cigarettes. She has a 30.00 pack-year smoking history. She has never used smokeless tobacco. She reports that she does not drink alcohol or use drugs.  Allergies:  Allergies  Allergen Reactions  . Budesonide-Formoterol Fumarate Other (See Comments)    coughed worse  . Lipitor [Atorvastatin] Other (See Comments)    Nosebleeds  . Penicillins Rash    Did it involve swelling of the face/tongue/throat, SOB, or low BP?  No Did it involve sudden or severe rash/hives, skin peeling, or any reaction on the inside of your mouth or nose? No Did you need to seek medical attention at a hospital or doctor's office? No When did it last happen?childhood allergy If all above answers are "NO", may proceed with cephalosporin use.     Medications Prior to Admission  Medication Sig Dispense Refill  . acetaminophen (TYLENOL) 500 MG tablet Take 500 mg by mouth every 6 (six) hours as needed for moderate pain or headache.    . albuterol (PROVENTIL HFA;VENTOLIN HFA) 108 (90 Base) MCG/ACT inhaler Inhale 2 puffs into the lungs every 6 (six) hours as needed.    . bismuth subsalicylate (PEPTO BISMOL) 262 MG/15ML suspension Take 30 mLs by mouth every 6 (six) hours as needed for indigestion.    . cholecalciferol (VITAMIN D3) 25 MCG (1000 UT) tablet Take 1,000 Units by mouth daily.    . fexofenadine (ALLEGRA) 180 MG tablet Take 180 mg by mouth daily as needed (nasal drainage).    . fluticasone (FLONASE) 50 MCG/ACT nasal spray USE 2 SPRAYS NASALLY DAILY (Patient taking differently: Place 2 sprays into both nostrils daily. ) 48 g 3  . metFORMIN (GLUCOPHAGE) 500 MG tablet Take 1 tablet (500  mg total) by mouth 2 (two) times daily with a meal. 180 tablet 3  . mometasone-formoterol (DULERA) 200-5 MCG/ACT AERO Take 2 puffs first thing in am and then another 2 puffs about 12 hours later. 3 Inhaler 3  . Multiple Vitamin (MULTIVITAMIN) tablet Take 6 tablets by mouth daily. Juice plus    . Omega-3 Fatty Acids (FISH OIL) 1200 MG CAPS Take 1,200 mg by mouth daily.     . polyethylene glycol powder (GLYCOLAX/MIRALAX) powder Take 17 grams (1 capful) every morning and 9 grams (1/2 capful) every evening (Patient taking differently: Take 17 g by mouth daily as needed for moderate constipation. ) 850 g 10  . pravastatin (PRAVACHOL) 40 MG tablet TAKE 1 TABLET DAILY 90 tablet 3  . pregabalin (LYRICA) 75 MG capsule Take 1 capsule (75 mg total) by mouth 3  (three) times daily. (Patient taking differently: Take 75 mg by mouth 2 (two) times daily. ) 270 capsule 3  . tiotropium (SPIRIVA HANDIHALER) 18 MCG inhalation capsule INHALE THE CONTENTS OF 1   CAPSULE VIA HANDIHALER     DAILY 90 capsule 3  . citalopram (CELEXA) 20 MG tablet Take 0.5 tablets (10 mg total) by mouth daily. (Patient not taking: Reported on 04/05/2018) 1 tablet 0  . doxycycline (VIBRA-TABS) 100 MG tablet Take 1 tablet (100 mg total) by mouth 2 (two) times daily. (Patient not taking: Reported on 04/05/2018) 14 tablet 0  . meloxicam (MOBIC) 15 MG tablet TAKE 1 TABLET(15 MG) BY MOUTH DAILY (Patient not taking: Reported on 04/05/2018) 90 tablet 1  . methylPREDNISolone (MEDROL) 4 MG tablet Medrol dose pack. Take as instructed (Patient not taking: Reported on 04/05/2018) 21 tablet 0  . metoCLOPramide (REGLAN) 5 MG tablet Take 1 tablet (5 mg total) by mouth 3 (three) times daily before meals. (Patient not taking: Reported on 04/05/2018) 90 tablet 0    Results for orders placed or performed during the hospital encounter of 04/06/18 (from the past 48 hour(s))  CBC     Status: Abnormal   Collection Time: 04/06/18  9:00 AM  Result Value Ref Range   WBC 16.4 (H) 4.0 - 10.5 K/uL   RBC 4.21 3.87 - 5.11 MIL/uL   Hemoglobin 11.1 (L) 12.0 - 15.0 g/dL   HCT 38.1 36.0 - 46.0 %   MCV 90.5 80.0 - 100.0 fL   MCH 26.4 26.0 - 34.0 pg   MCHC 29.1 (L) 30.0 - 36.0 g/dL   RDW 15.3 11.5 - 15.5 %   Platelets 360 150 - 400 K/uL   nRBC 0.0 0.0 - 0.2 %    Comment: Performed at [facility], [place] [address], [place], [place] [address]  Basic metabolic panel     Status: Abnormal   Collection Time: 04/06/18  9:00 AM  Result Value Ref Range   Sodium 138 135 - 145 mmol/L   Potassium 5.1 3.5 - 5.1 mmol/L   Chloride 104 98 - 111 mmol/L   CO2 25 22 - 32 mmol/L   Glucose, Bld 116 (H) 70 - 99 mg/dL   BUN 18 8 - 23 mg/dL   Creatinine, Ser 1.36 (H) 0.44 - 1.00 mg/dL   Calcium 8.7 (L) 8.9 - 10.3 mg/dL    GFR calc non Af Amer 38 (L) >60 mL/min   GFR calc Af Amer 44 (L) >60 mL/min   Anion gap 9 5 - 15    Comment: Performed at [facility], [place] [address] [place], [place] [address]  Ct Hip Left Wo Contrast  Result Date: 04/05/2018 CLINICAL DATA:  Persistent left hip pain and difficulty bearing weight since fall 9 days ago. EXAM: CT OF THE LEFT HIP WITHOUT CONTRAST TECHNIQUE: Multidetector CT imaging of the left hip was performed according to the standard protocol. Multiplanar CT image reconstructions were also generated. COMPARISON:  Left hip x-rays from same day. CT abdomen pelvis dated May 20, 2011. FINDINGS: Bones/Joint/Cartilage Minimally impacted subcapital left femoral neck fracture. No dislocation. Small well-defined lucent lesion in the left femoral neck is unchanged since 2013 and likely represents a synovial herniation pit. No joint effusion. Ligaments Suboptimally assessed by CT. Muscles and Tendons Intact. Soft tissues Small subcutaneous hematoma along the posterolateral left hip. Vascular calcifications. IMPRESSION: 1. Minimally impacted subcapital left femoral neck fracture. These results will be called to the ordering clinician or representative by the Radiologist Assistant, and communication documented in the PACS or zVision Dashboard. Electronically Signed   By: Titus Dubin M.D.   On: 04/05/2018 12:12   Xr Hip Unilat W Or W/o Pelvis 1v Left  Result Date: 04/05/2018 AP pelvis lateral view left hip: Bilateral hips well located hips are overall well-maintained.  Right hip no acute fractures no bone abnormalities.  Subtle subcapital irregularity in the cortices of left hip suspicious for subcapital hip fracture nondisplaced.   Review of Systems  All other systems reviewed and are negative.   Blood pressure 139/66, pulse 90, temperature 98.2 F (36.8 C), temperature source Oral, resp. rate 16, height 5\' 2"  (1.575 m), weight 56.2 kg, SpO2 94 %. Physical Exam   Constitutional: She appears well-developed and well-nourished.  HENT:  Head: Normocephalic and atraumatic.  Eyes: Pupils are equal, round, and reactive to light. EOM are normal.  Neck: Normal range of motion. Neck supple.  Cardiovascular: Normal rate and regular rhythm.  Respiratory: Effort normal and breath sounds normal.  GI: Soft. Bowel sounds are normal.  Musculoskeletal:     Left hip: She exhibits decreased range of motion, decreased strength, tenderness and bony tenderness.  Neurological: She is alert.  Skin: Skin is warm and dry.  Psychiatric: She has a normal mood and affect.     Assessment/Plan Non-displaced left hip femoral neck fracture  To the OR today for cannulated screw fixation (hip pinning) to address her left hip fracture.  Family is at the bedside.  The risks and benefits of surgery have been discussed in detail and informed consent is obtained.  Mcarthur Rossetti, MD 04/06/2018, 10:30 AM

## 2018-04-06 NOTE — Brief Op Note (Signed)
04/06/2018  12:22 PM  PATIENT:  [name]  73 y.o. female  PRE-OPERATIVE DIAGNOSIS:  non-displaced left hip femoral neck fracture  POST-OPERATIVE DIAGNOSIS:  non-displaced left hip femoral neck fracture  PROCEDURE:  Procedure(s): CANNULATED SCREWS/HIP PINNING LEFT HIP (Left)  SURGEON:  Surgeon(s) and Role:    Mcarthur Rossetti, MD - Primary  PHYSICIAN ASSISTANT: Benita Stabile, PA-C  ANESTHESIA:   spinal  EBL:  100 mL   COUNTS:  YES  TOURNIQUET:  * No tourniquets in log *  DICTATION: .Other Dictation: Dictation Number [id number]  PLAN OF CARE: Admit to inpatient   PATIENT DISPOSITION:  PACU - hemodynamically stable.   Delay start of Pharmacological VTE agent (>24hrs) due to surgical blood loss or risk of bleeding: no

## 2018-04-06 NOTE — Anesthesia Postprocedure Evaluation (Signed)
Anesthesia Post Note  Patient: [name]  Procedure(s) Performed: CANNULATED SCREWS/HIP PINNING LEFT HIP (Left )     Patient location during evaluation: Nursing Unit Anesthesia Type: Spinal Level of consciousness: oriented and awake and alert Pain management: pain level controlled Vital Signs Assessment: post-procedure vital signs reviewed and stable Respiratory status: spontaneous breathing and respiratory function stable Cardiovascular status: blood pressure returned to baseline and stable Postop Assessment: no headache, no backache, no apparent nausea or vomiting and patient able to bend at knees Anesthetic complications: no    Last Vitals:  Vitals:   04/06/18 0848 04/06/18 1230  BP: 139/66 (!) 115/52  Pulse: 90 86  Resp: 16 13  Temp: 36.8 C 36.4 C  SpO2: 94% 100%    Last Pain:  Vitals:   04/06/18 1230  TempSrc:   PainSc: 0-No pain                 Barnet Glasgow

## 2018-04-06 NOTE — Transfer of Care (Signed)
Immediate Anesthesia Transfer of Care Note  Patient: [name]  Procedure(s) Performed: CANNULATED SCREWS/HIP PINNING LEFT HIP (Left )  Patient Location: PACU  Anesthesia Type:Spinal  Level of Consciousness: sedated  Airway & Oxygen Therapy: Patient Spontanous Breathing and Patient connected to face mask oxygen  Post-op Assessment: Report given to RN and Post -op Vital signs reviewed and stable  Post vital signs: Reviewed and stable  Last Vitals:  Vitals Value Taken Time  BP    Temp    Pulse 86 04/06/2018 12:30 PM  Resp 13 04/06/2018 12:30 PM  SpO2 91 % 04/06/2018 12:30 PM  Vitals shown include unvalidated device data.  Last Pain:  Vitals:   04/06/18 0848  TempSrc: Oral      Patients Stated Pain Goal: 4 (63/81/77 1165)  Complications: No apparent anesthesia complications

## 2018-04-06 NOTE — Anesthesia Procedure Notes (Signed)
Spinal  Patient location during procedure: OR Start time: 04/06/2018 11:17 AM End time: 04/06/2018 11:26 AM Staffing Anesthesiologist: Barnet Glasgow, MD Performed: anesthesiologist  Preanesthetic Checklist Completed: patient identified, surgical consent, pre-op evaluation, timeout performed, IV checked, risks and benefits discussed and monitors and equipment checked Spinal Block Patient position: right lateral decubitus Prep: site prepped and draped and DuraPrep Patient monitoring: heart rate, cardiac monitor, continuous pulse ox and blood pressure Approach: midline Location: L3-4 Injection technique: single-shot Needle Needle type: Pencan  Needle gauge: 24 G Needle length: 10 cm Needle insertion depth: 6 cm Assessment Sensory level: T4 Additional Notes 2 Attempt (s). Pt tolerated procedure well.

## 2018-04-06 NOTE — Addendum Note (Signed)
Addendum  created 04/06/18 1435 by Barnet Glasgow, MD   Order list changed, Order sets accessed

## 2018-04-07 ENCOUNTER — Encounter (HOSPITAL_COMMUNITY): Payer: Self-pay | Admitting: Orthopaedic Surgery

## 2018-04-07 LAB — CBC
HCT: 30.1 % — ABNORMAL LOW (ref 36.0–46.0)
HEMOGLOBIN: 8.9 g/dL — AB (ref 12.0–15.0)
MCH: 26.7 pg (ref 26.0–34.0)
MCHC: 29.6 g/dL — ABNORMAL LOW (ref 30.0–36.0)
MCV: 90.4 fL (ref 80.0–100.0)
Platelets: 289 10*3/uL (ref 150–400)
RBC: 3.33 MIL/uL — ABNORMAL LOW (ref 3.87–5.11)
RDW: 14.9 % (ref 11.5–15.5)
WBC: 11.6 10*3/uL — ABNORMAL HIGH (ref 4.0–10.5)
nRBC: 0 % (ref 0.0–0.2)

## 2018-04-07 LAB — BASIC METABOLIC PANEL
Anion gap: 7 (ref 5–15)
BUN: 16 mg/dL (ref 8–23)
CO2: 24 mmol/L (ref 22–32)
Calcium: 7.8 mg/dL — ABNORMAL LOW (ref 8.9–10.3)
Chloride: 107 mmol/L (ref 98–111)
Creatinine, Ser: 1.34 mg/dL — ABNORMAL HIGH (ref 0.44–1.00)
GFR calc Af Amer: 45 mL/min — ABNORMAL LOW (ref >60)
GFR calc non Af Amer: 39 mL/min — ABNORMAL LOW (ref >60)
Glucose, Bld: 161 mg/dL — ABNORMAL HIGH (ref 70–99)
Potassium: 5 mmol/L (ref 3.5–5.1)
Sodium: 138 mmol/L (ref 135–145)

## 2018-04-07 MED ORDER — GUAIFENESIN ER 600 MG PO TB12
600.0000 mg | ORAL_TABLET | Freq: Two times a day (BID) | ORAL | Status: DC | PRN
  Administered 2018-04-07 – 2018-04-09 (×2): 600 mg via ORAL
  Filled 2018-04-07 (×2): qty 1

## 2018-04-07 NOTE — Progress Notes (Signed)
RN received call from OT that during session pt had c/o dizziness and coughing spell.  O2 sats did drop, and O2 was applied at 2L.  O2 sats and dizziness did improve with rest.  Pt was able to get up in chair with OT.  Artis Delay, [place] made aware of dizziness and drop in O2 sat.  RN will monitor.

## 2018-04-07 NOTE — Progress Notes (Signed)
CSW consult- SNF Plan: [place] signing off.   Kathrin Greathouse, Marlinda Mike, MSW Clinical Social Worker  [phone] 04/07/2018  1:48 PM

## 2018-04-07 NOTE — Evaluation (Signed)
Occupational Therapy Evaluation Patient Details Name: [name] MRN: [id number] DOB: Feb 02, 1946 Today's Date: 04/07/2018    History of Present Illness 73 yo female who sustained a non-displaced left hip fracture following a mechanical fall a week ago now s/p cannulated screw fixation/percutaneous pinning of left hip. PMHx: COPD   Clinical Impression   This 73 yo female admitted and underwent above presents to acute OT with limitations of dizziness and drop in O2 sats on RA thus affecting her safety and independence with basic ADLs. She will benefit from acute OT without need for follow up. Pt on 1 liter upon arrival with sats 95%; removed O2 and had pt sit up at which point she had an extended coughing episode with sats dropping to 83%. O2 reapplied at 2 liters with encouragement for purse lip breathing with O2 up to 98%. Pt did not initially c/o dizziness upon sitting up, but did after we stood up which continued once seated checked BP 114/70. Had pt stand and took BP again 117/60 with pt stating dizziness was getting worse, checked BP again once she was seated 110/56. At end of session with feet up pt reported dizziness gone.    Follow Up Recommendations  No OT follow up;Supervision/Assistance - 24 hour    Equipment Recommendations  None recommended by OT       Precautions / Restrictions Precautions Precautions: Fall Precaution Comments: monitor O2 (dropped to 83% on RA), dizziness upon getting up (BPs were stable) Restrictions Weight Bearing Restrictions: No      Mobility Bed Mobility Overal bed mobility: Modified Independent                Transfers Overall transfer level: Needs assistance Equipment used: Rolling walker (2 wheeled) Transfers: Sit to/from Stand Sit to Stand: Min assist         General transfer comment: Cues for safe hand placement and pt a little off balacne due to dizziness    Balance Overall balance assessment: Needs  assistance Sitting-balance support: No upper extremity supported;Feet supported Sitting balance-Leahy Scale: Good       Standing balance-Leahy Scale: Poor Standing balance comment: reliant on RW and additional external support                           ADL either performed or assessed with clinical judgement   ADL Overall ADL's : Needs assistance/impaired Eating/Feeding: Independent;Sitting   Grooming: Set up;Sitting   Upper Body Bathing: Set up;Sitting   Lower Body Bathing: Minimal assistance;Sit to/from stand   Upper Body Dressing : Set up;Sitting   Lower Body Dressing: Minimal assistance;Sit to/from stand   Toilet Transfer: Minimal assistance;Stand-pivot;RW Armed forces technical officer Details (indicate cue type and reason): bed>recliner Toileting- Clothing Manipulation and Hygiene: Minimal assistance;Sit to/from stand         General ADL Comments: Educated pt and husband on using 3n1 in shower stall and how to position in, pt moving RLE as well as LLE so no specific sequence of getting dressed, recommended that she use her 3n1 over toilet for now to make it easier for her to get up and down, recommended that husband be with her anytime she is up on her feet until they feel comfortable to not be doing this (even more so if she goes home with O2). We also discussed not taking hot/steaming showers because this can affect her breathing as well.     Vision Patient Visual Report: No change from baseline  Pertinent Vitals/Pain Pain Assessment: 0-10 Pain Score: 3  Pain Location: left hip Pain Descriptors / Indicators: Sore Pain Intervention(s): Limited activity within patient's tolerance;Monitored during session;Repositioned;Ice applied;Premedicated before session     Hand Dominance Right   Extremity/Trunk Assessment Upper Extremity Assessment Upper Extremity Assessment: Overall WFL for tasks assessed           Communication  Communication Communication: No difficulties   Cognition Arousal/Alertness: Awake/alert Behavior During Therapy: WFL for tasks assessed/performed Overall Cognitive Status: Within Functional Limits for tasks assessed                                                Home Living Family/patient expects to be discharged to:: Private residence Living Arrangements: Spouse/significant other Available Help at Discharge: Family;Available 24 hours/day Type of Home: House Home Access: Stairs to enter [employer] of Steps: 1 Entrance Stairs-Rails: Right Home Layout: One level     Bathroom Shower/Tub: Walk-in shower;Door   [employer] Toilet: Standard     Home Equipment: Kasandra Knudsen - single point;Walker - 2 wheels;Bedside commode;Grab bars - tub/shower;Hand held shower head          Prior Functioning/Environment Level of Independence: Independent                 OT Problem List: Decreased strength;Impaired balance (sitting and/or standing);Pain;Cardiopulmonary status limiting activity;Decreased knowledge of use of DME or AE      OT Treatment/Interventions: Self-care/ADL training;Balance training;Energy conservation;DME and/or AE instruction;Patient/family education    OT Goals(Current goals can be found in the care plan section) Acute Rehab OT Goals Patient Stated Goal: home hopefully today OT Goal Formulation: With patient/family Time For Goal Achievement: 04/14/18 Potential to Achieve Goals: Good  OT Frequency: Min 2X/week    AM-PAC OT "6 Clicks" Daily Activity     Outcome Measure Help from another person eating meals?: None Help from another person taking care of personal grooming?: A Little Help from another person toileting, which includes using toliet, bedpan, or urinal?: A Little Help from another person bathing (including washing, rinsing, drying)?: A Little Help from another person to put on and taking off regular upper body clothing?: A  Little Help from another person to put on and taking off regular lower body clothing?: A Little 6 Click Score: 19   End of Session Equipment Utilized During Treatment: Gait belt;Rolling walker;Oxygen(2 liters) Nurse Communication: Mobility status(dizziness and O2 drop on RA); Let PT know as well.  Activity Tolerance: Other (comment)(pt limited by dizziness and O2 drop when on RA) Patient left: in chair;with call bell/phone within reach;with chair alarm set  OT Visit Diagnosis: Unsteadiness on feet (R26.81);Other abnormalities of gait and mobility (R26.89);History of falling (Z91.81);Pain Pain - Right/Left: Left Pain - part of body: Hip                Time: 0721-0813 OT Time Calculation (min): 52 min Charges:  OT General Charges $OT Visit: 1 Visit OT Evaluation $OT Eval Moderate Complexity: 1 Mod OT Treatments $Self Care/Home Management : 23-37 mins  Golden Circle, OTR/L Acute [employer] Pager [phone] Office [phone]     Almon Register 04/07/2018, 8:31 AM

## 2018-04-07 NOTE — Progress Notes (Addendum)
Physical Therapy Treatment Patient Details Name: [name] MRN: [id number] DOB: Jun 29, 1945 Today's Date: 04/07/2018    History of Present Illness 73 yo female who sustained a non-displaced left hip fracture following a mechanical fall a week ago now s/p cannulated screw fixation/percutaneous pinning of left hip. PMHx: COPD    PT Comments    Pt continues motivated and with noted improvement in stability but with continued questionable safety awareness and noted difficulty with memory.   Follow Up Recommendations  Home health PT     Equipment Recommendations  3n1 - requested by dtr    Recommendations for Other Services       Precautions / Restrictions Precautions Precautions: Fall Precaution Comments: monitor O2 (dropped to 83% on RA), dizziness upon getting up (BPs were stable) Restrictions Weight Bearing Restrictions: No Other Position/Activity Restrictions: WBAT    Mobility  Bed Mobility Overal bed mobility: Modified Independent             General bed mobility comments: Pt up to EOB unassisted  Transfers Overall transfer level: Needs assistance Equipment used: Rolling walker (2 wheeled) Transfers: Sit to/from Stand Sit to Stand: Min guard         General transfer comment: Cues for safe hand placement and to steady  Ambulation/Gait Ambulation/Gait assistance: Min assist;Min guard Gait Distance (Feet): 300 Feet(and 15' into bathroom) Assistive device: Rolling walker (2 wheeled) Gait Pattern/deviations: Step-through pattern;Decreased step length - right;Decreased step length - left;Shuffle;Narrow base of support     General Gait Details: cues for posture, safety awareness and position from RW;    Stairs             Wheelchair Mobility    Modified Rankin (Stroke Patients Only)       Balance Overall balance assessment: Needs assistance Sitting-balance support: No upper extremity supported;Feet supported Sitting balance-Leahy Scale:  Good     Standing balance support: No upper extremity supported Standing balance-Leahy Scale: Fair                              Cognition Arousal/Alertness: Awake/alert Behavior During Therapy: WFL for tasks assessed/performed Overall Cognitive Status: History of cognitive impairments - at baseline                                 General Comments: memory deficits      Exercises General Exercises - Lower Extremity Ankle Circles/Pumps: AROM;Both;15 reps;Supine    General Comments        Pertinent Vitals/Pain Pain Assessment: 0-10 Pain Score: 3  Pain Location: left hip Pain Descriptors / Indicators: Sore Pain Intervention(s): Limited activity within patient's tolerance;Monitored during session;Premedicated before session;Ice applied    Home Living                      Prior Function            PT Goals (current goals can now be found in the care plan section) Acute Rehab PT Goals Patient Stated Goal: home PT Goal Formulation: With patient Time For Goal Achievement: 04/14/18 Potential to Achieve Goals: Good    Frequency    Min 6X/week      PT Plan Current plan remains appropriate    Co-evaluation              AM-PAC PT "6 Clicks" Mobility   Outcome Measure  Help  needed turning from your back to your side while in a flat bed without using bedrails?: None Help needed moving from lying on your back to sitting on the side of a flat bed without using bedrails?: None Help needed moving to and from a bed to a chair (including a wheelchair)?: A Little Help needed standing up from a chair using your arms (e.g., wheelchair or bedside chair)?: A Little Help needed to walk in hospital room?: A Little Help needed climbing 3-5 steps with a railing? : A Little 6 Click Score: 20    End of Session Equipment Utilized During Treatment: Gait belt;Oxygen Activity Tolerance: Patient tolerated treatment well Patient left: in  chair;with call bell/phone within reach;with chair alarm set;with family/visitor present Nurse Communication: Mobility status PT Visit Diagnosis: Muscle weakness (generalized) (M62.81);Difficulty in walking, not elsewhere classified (R26.2);History of falling (Z91.81)     Time: 6834-1962 PT Time Calculation (min) (ACUTE ONLY): 34 min  Charges:  $Gait Training: 8-22 mins $Therapeutic Activity: 8-22 mins                     [facility] Pager [phone] Office [phone]    Keaghan Staton 04/07/2018, 5:00 PM

## 2018-04-07 NOTE — Evaluation (Signed)
Physical Therapy Evaluation Patient Details Name: [name] MRN: [id number] DOB: [birth year] Today's Date: 04/07/2018   History of Present Illness  73 yo female who sustained a non-displaced left hip fracture following a mechanical fall a week ago now s/p cannulated screw fixation/percutaneous pinning of left hip. PMHx: COPD  Clinical Impression  Pt admitted as above and presenting with functional mobility limitations 2* decreased strength L LE, post op pain and balance deficits.  Pt should progress to dc home with 24/7 family assist.    Follow Up Recommendations Home health PT    Equipment Recommendations  None recommended by PT    Recommendations for Other Services       Precautions / Restrictions Precautions Precautions: Fall Precaution Comments: monitor O2 (dropped to 83% on RA), dizziness upon getting up (BPs were stable) Restrictions Weight Bearing Restrictions: No Other Position/Activity Restrictions: WBAT      Mobility  Bed Mobility Overal bed mobility: Modified Independent             General bed mobility comments: Pt into bed without assist  Transfers Overall transfer level: Needs assistance Equipment used: Rolling walker (2 wheeled) Transfers: Sit to/from [employer] Sit to Stand: Min assist Stand pivot transfers: Min assist       General transfer comment: Cues for safe hand placement and to steady  Ambulation/Gait Ambulation/Gait assistance: Min assist Gait Distance (Feet): 200 Feet Assistive device: Rolling walker (2 wheeled) Gait Pattern/deviations: Step-through pattern;Decreased step length - right;Decreased step length - left;Shuffle;Narrow base of support     General Gait Details: cues for posture and position from RW; physical assist for RW management and balance  Stairs            Wheelchair Mobility    Modified Rankin (Stroke Patients Only)       Balance Overall balance assessment: Needs  assistance Sitting-balance support: No upper extremity supported;Feet supported Sitting balance-Leahy Scale: Good     Standing balance support: Bilateral upper extremity supported Standing balance-Leahy Scale: Poor Standing balance comment: reliant on RW and additional external support                             Pertinent Vitals/Pain Pain Assessment: 0-10 Pain Score: 3  Pain Location: left hip Pain Descriptors / Indicators: Sore Pain Intervention(s): Limited activity within patient's tolerance;Monitored during session;Premedicated before session    Home Living Family/patient expects to be discharged to:: Private residence Living Arrangements: Spouse/significant other Available Help at Discharge: Family;Available 24 hours/day Type of Home: House Home Access: Stairs to enter Entrance Stairs-Rails: Right Entrance Stairs-Number of Steps: 1 Home Layout: One level Home Equipment: Cane - single point;Walker - 2 wheels;Bedside commode;Grab bars - tub/shower;Hand held shower head      Prior Function Level of Independence: Independent         Comments: Pt admits to frequent falls     Hand Dominance   Dominant Hand: Right    Extremity/Trunk Assessment   Upper Extremity Assessment Upper Extremity Assessment: Overall WFL for tasks assessed    Lower Extremity Assessment Lower Extremity Assessment: LLE deficits/detail LLE Deficits / Details: Pt moving L LE actively including SLR    Cervical / Trunk Assessment Cervical / Trunk Assessment: Kyphotic  Communication   Communication: No difficulties  Cognition Arousal/Alertness: Awake/alert Behavior During Therapy: WFL for tasks assessed/performed Overall Cognitive Status: Within Functional Limits for tasks assessed  General Comments      Exercises General Exercises - Lower Extremity Ankle Circles/Pumps: AROM;Both;15 reps;Supine   Assessment/Plan     PT Assessment Patient needs continued PT services  PT Problem List Decreased strength;Decreased range of motion;Decreased activity tolerance;Decreased balance;Decreased mobility;Decreased knowledge of use of DME;Decreased safety awareness;Pain       PT Treatment Interventions DME instruction;Gait training;Stair training;Functional mobility training;Therapeutic activities;Therapeutic exercise;Patient/family education    PT Goals (Current goals can be found in the Care Plan section)  Acute Rehab PT Goals Patient Stated Goal: home hopefully today PT Goal Formulation: With patient Time For Goal Achievement: 04/14/18 Potential to Achieve Goals: Good    Frequency Min 6X/week   Barriers to discharge        Co-evaluation               AM-PAC PT "6 Clicks" Mobility  Outcome Measure Help needed turning from your back to your side while in a flat bed without using bedrails?: None Help needed moving from lying on your back to sitting on the side of a flat bed without using bedrails?: None Help needed moving to and from a bed to a chair (including a wheelchair)?: A Little Help needed standing up from a chair using your arms (e.g., wheelchair or bedside chair)?: A Little Help needed to walk in hospital room?: A Little Help needed climbing 3-5 steps with a railing? : A Lot 6 Click Score: 19    End of Session Equipment Utilized During Treatment: Gait belt;Oxygen Activity Tolerance: Patient tolerated treatment well Patient left: in bed;with call bell/phone within reach;with family/visitor present Nurse Communication: Mobility status PT Visit Diagnosis: Muscle weakness (generalized) (M62.81);Difficulty in walking, not elsewhere classified (R26.2);History of falling (Z91.81)    Time: 9539-6728 PT Time Calculation (min) (ACUTE ONLY): 32 min   Charges:   PT Evaluation $PT Eval Low Complexity: 1 Low PT Treatments $Gait Training: 8-22 mins        Debe Coder PT Acute  Rehabilitation Services Pager [phone] Office [phone]   Eduardo Honor 04/07/2018, 12:49 PM

## 2018-04-07 NOTE — Progress Notes (Signed)
Subjective: 1 Day Post-Op Procedure(s) (LRB): CANNULATED SCREWS/HIP PINNING LEFT HIP (Left) Patient reports pain as mild.  Overall states hip feels better today.   Objective: Vital signs in last 24 hours: Temp:  [97.5 F (36.4 C)-98.2 F (36.8 C)] 98.1 F (36.7 C) (01/31 1342) Pulse Rate:  [72-83] 77 (01/31 1342) Resp:  [14-16] 16 (01/31 1342) BP: (103-115)/(51-61) 114/51 (01/31 1342) SpO2:  [94 %-98 %] 94 % (01/31 1342)  Intake/Output from previous day: 01/30 0701 - 01/31 0700 In: 2530.4 [P.O.:120; I.V.:2310.4; IV Piggyback:100] Out: 2450 [Urine:2350; Blood:100] Intake/Output this shift: Total I/O In: 120 [P.O.:120] Out: 300 [Urine:300]  Recent Labs    04/06/18 0900 04/07/18 0455  HGB 11.1* 8.9*   Recent Labs    04/06/18 0900 04/07/18 0455  WBC 16.4* 11.6*  RBC 4.21 3.33*  HCT 38.1 30.1*  PLT 360 289   Recent Labs    04/06/18 0900 04/07/18 0455  NA 138 138  K 5.1 5.0  CL 104 107  CO2 25 24  BUN 18 16  CREATININE 1.36* 1.34*  GLUCOSE 116* 161*  CALCIUM 8.7* 7.8*   No results for input(s): LABPT, INR in the last 72 hours.  Left hip: Sensation intact distally Dorsiflexion/Plantar flexion intact Incision: moderate drainage Compartment soft    Assessment/Plan: 1 Day Post-Op Procedure(s) (LRB): CANNULATED SCREWS/HIP PINNING LEFT HIP (Left) Up with therapy  ABLA secondary to surgery monitor for symptoms of anemia  COPD encourage respiratory toileting     [name] 04/07/2018, 3:31 PM

## 2018-04-07 NOTE — Progress Notes (Signed)
PT NOTE - O2 Saturation Qualification SATURATION QUALIFICATIONS: (This note is used to comply with regulatory documentation for home oxygen)  Patient Saturations on Room Air at Rest = 88%  Patient Saturations on Room Air while Ambulating = 75%  Patient Saturations on 2 Liters of oxygen while Ambulating = 88%  Please briefly explain why patient needs home oxygen: To maintain appropriate oxygen saturation with activity.

## 2018-04-07 NOTE — Progress Notes (Signed)
Patient dangled on bedside. Vitals stable. Pain controlled by PRN medications.

## 2018-04-08 ENCOUNTER — Inpatient Hospital Stay (HOSPITAL_COMMUNITY): Payer: Medicare Other

## 2018-04-08 LAB — BASIC METABOLIC PANEL
Anion gap: 5 (ref 5–15)
BUN: 16 mg/dL (ref 8–23)
CO2: 26 mmol/L (ref 22–32)
Calcium: 8.1 mg/dL — ABNORMAL LOW (ref 8.9–10.3)
Chloride: 110 mmol/L (ref 98–111)
Creatinine, Ser: 1.26 mg/dL — ABNORMAL HIGH (ref 0.44–1.00)
GFR calc non Af Amer: 42 mL/min — ABNORMAL LOW (ref >60)
GFR, EST AFRICAN AMERICAN: 49 mL/min — AB (ref >60)
Glucose, Bld: 105 mg/dL — ABNORMAL HIGH (ref 70–99)
Potassium: 4.6 mmol/L (ref 3.5–5.1)
Sodium: 141 mmol/L (ref 135–145)

## 2018-04-08 LAB — CBC
HEMATOCRIT: 31.1 % — AB (ref 36.0–46.0)
HEMOGLOBIN: 8.9 g/dL — AB (ref 12.0–15.0)
MCH: 26.1 pg (ref 26.0–34.0)
MCHC: 28.6 g/dL — ABNORMAL LOW (ref 30.0–36.0)
MCV: 91.2 fL (ref 80.0–100.0)
NRBC: 0 % (ref 0.0–0.2)
Platelets: 315 10*3/uL (ref 150–400)
RBC: 3.41 MIL/uL — AB (ref 3.87–5.11)
RDW: 15.3 % (ref 11.5–15.5)
WBC: 9.7 10*3/uL (ref 4.0–10.5)

## 2018-04-08 MED ORDER — LEVOFLOXACIN 500 MG PO TABS: 250.0000 mg | ORAL_TABLET | Freq: Every day | ORAL | Status: DC

## 2018-04-08 MED ORDER — FUROSEMIDE 10 MG/ML IJ SOLN
20.0000 mg | Freq: Once | INTRAMUSCULAR | Status: AC
  Administered 2018-04-08: 20 mg via INTRAVENOUS
  Filled 2018-04-08: qty 2

## 2018-04-08 MED ORDER — ALBUTEROL SULFATE (2.5 MG/3ML) 0.083% IN NEBU
2.5000 mg | INHALATION_SOLUTION | Freq: Three times a day (TID) | RESPIRATORY_TRACT | Status: DC
  Administered 2018-04-08 – 2018-04-09 (×3): 2.5 mg via RESPIRATORY_TRACT
  Filled 2018-04-08 (×3): qty 3

## 2018-04-08 MED ORDER — LEVOFLOXACIN 500 MG PO TABS: 500.0000 mg | ORAL_TABLET | Freq: Every day | ORAL | Status: DC

## 2018-04-08 MED ORDER — LEVOFLOXACIN 500 MG PO TABS
500.0000 mg | ORAL_TABLET | Freq: Once | ORAL | Status: AC
  Administered 2018-04-08: 500 mg via ORAL
  Filled 2018-04-08: qty 1

## 2018-04-08 NOTE — Progress Notes (Signed)
   Subjective: 2 Days Post-Op Procedure(s) (LRB): CANNULATED SCREWS/HIP PINNING LEFT HIP (Left) Patient reports pain as mild and moderate.    Objective: Vital signs in last 24 hours: Temp:  [98.1 F (36.7 C)-98.8 F (37.1 C)] 98.8 F (37.1 C) (02/01 0543) Pulse Rate:  [70-91] 70 (02/01 0543) Resp:  [16] 16 (02/01 0543) BP: (114-149)/(51-68) 133/68 (02/01 0543) SpO2:  [94 %-100 %] 98 % (02/01 0951)  Intake/Output from previous day: 01/31 0701 - 02/01 0700 In: 1265 [P.O.:480; I.V.:785] Out: 1600 [Urine:1600] Intake/Output this shift: Total I/O In: 480 [P.O.:480] Out: 700 [Urine:700]  Recent Labs    04/06/18 0900 04/07/18 0455 04/08/18 0444  HGB 11.1* 8.9* 8.9*   Recent Labs    04/07/18 0455 04/08/18 0444  WBC 11.6* 9.7  RBC 3.33* 3.41*  HCT 30.1* 31.1*  PLT 289 315   Recent Labs    04/07/18 0455 04/08/18 0444  NA 138 141  K 5.0 4.6  CL 107 110  CO2 24 26  BUN 16 16  CREATININE 1.34* 1.26*  GLUCOSE 161* 105*  CALCIUM 7.8* 8.1*   No results for input(s): LABPT, INR in the last 72 hours.  incision and dressing intact.  No results found.  Assessment/Plan: 2 Days Post-Op Procedure(s) (LRB): CANNULATED SCREWS/HIP PINNING LEFT HIP (Left) Up with therapy.   O2 Sats. Are dropping with effort. OOB to recliner. Creatinine better. Hgb 8.9. will check CXR , some rhochi and wheezing.   Marybelle Killings 04/08/2018, 11:20 AM

## 2018-04-08 NOTE — Evaluation (Signed)
Physical Therapy Evaluation Patient Details Name: [name] MRN: [id number] DOB: December 05, 1945 Today's Date: 04/08/2018   History of Present Illness  73 yo female who sustained a non-displaced left hip fracture following a mechanical fall a week ago now s/p cannulated screw fixation/percutaneous pinning of left hip. PMHx: COPD  Clinical Impression  Pt continues pleasant and cooperative and with noted improvement an ambulatory stability.    Follow Up Recommendations Home health PT    Equipment Recommendations  3in1 (PT)    Recommendations for Other Services       Precautions / Restrictions Precautions Precautions: Fall Precaution Comments: monitor O2 (dropped to 88% on1L), Restrictions Weight Bearing Restrictions: No Other Position/Activity Restrictions: WBAT      Mobility  Bed Mobility Overal bed mobility: Needs Assistance Bed Mobility: Sit to Supine     Supine to sit: Supervision Sit to supine: Supervision      Transfers Overall transfer level: Needs assistance Equipment used: Rolling walker (2 wheeled) Transfers: Sit to/from Stand Sit to Stand: Min guard Stand pivot transfers: Min guard       General transfer comment: Cues for safe hand placement and to steady  Ambulation/Gait Ambulation/Gait assistance: Min guard Gait Distance (Feet): 225 Feet(and 15' from bathroom) Assistive device: Rolling walker (2 wheeled) Gait Pattern/deviations: Step-through pattern;Decreased step length - right;Decreased step length - left;Shuffle;Narrow base of support     General Gait Details: cues for posture, safety awareness and position from RW;   Stairs            Wheelchair Mobility    Modified Rankin (Stroke Patients Only)       Balance Overall balance assessment: Needs assistance Sitting-balance support: No upper extremity supported;Feet supported Sitting balance-Leahy Scale: Good     Standing balance support: No upper extremity supported Standing  balance-Leahy Scale: Fair                               Pertinent Vitals/Pain Pain Assessment: 0-10 Pain Score: 4  Pain Location: left hip Pain Descriptors / Indicators: Sore Pain Intervention(s): Limited activity within patient's tolerance;Monitored during session;Premedicated before session    Home Living                        Prior Function                 Hand Dominance        Extremity/Trunk Assessment                Communication      Cognition Arousal/Alertness: Awake/alert Behavior During Therapy: WFL for tasks assessed/performed Overall Cognitive Status: History of cognitive impairments - at baseline                                 General Comments: memory deficits      General Comments      Exercises     Assessment/Plan    PT Assessment    PT Problem List         PT Treatment Interventions      PT Goals (Current goals can be found in the Care Plan section)  Acute Rehab PT Goals Patient Stated Goal: home PT Goal Formulation: With patient Time For Goal Achievement: 04/14/18 Potential to Achieve Goals: Good    Frequency Min 6X/week   Barriers to discharge  Co-evaluation               AM-PAC PT "6 Clicks" Mobility  Outcome Measure Help needed turning from your back to your side while in a flat bed without using bedrails?: None Help needed moving from lying on your back to sitting on the side of a flat bed without using bedrails?: None Help needed moving to and from a bed to a chair (including a wheelchair)?: A Little Help needed standing up from a chair using your arms (e.g., wheelchair or bedside chair)?: A Little Help needed to walk in hospital room?: A Little Help needed climbing 3-5 steps with a railing? : A Little 6 Click Score: 20    End of Session Equipment Utilized During Treatment: Gait belt;Oxygen Activity Tolerance: Patient tolerated treatment well Patient left: in  bed;with call bell/phone within reach;with family/visitor present Nurse Communication: Mobility status PT Visit Diagnosis: Muscle weakness (generalized) (M62.81);Difficulty in walking, not elsewhere classified (R26.2);History of falling (Z91.81)    Time: 5797-2820 PT Time Calculation (min) (ACUTE ONLY): 23 min   Charges:     PT Treatments $Gait Training: 8-22 mins $Therapeutic Activity: 8-22 mins        [place] Pager [phone] Office [phone]     Machell Wirthlin 04/08/2018, 4:30 PM

## 2018-04-08 NOTE — Progress Notes (Signed)
Dr. Ninfa Linden notified of chest x ray results. No needs at this time. Pt reports feeling better this afternoon and did well working with physical therapy. Family remains at bedside.

## 2018-04-08 NOTE — Progress Notes (Signed)
PHARMACY NOTE:  ANTIMICROBIAL RENAL DOSAGE ADJUSTMENT  Current antimicrobial regimen includes a mismatch between antimicrobial dosage and estimated renal function.  As per policy approved by the Pharmacy & Therapeutics and Medical Executive Committees, the antimicrobial dosage will be adjusted accordingly.  Current antimicrobial dosage:  Levaquin 500mg  po daily  Indication: suspected respiratory infection (chest xray pending)  Renal Function:  Estimated Creatinine Clearance: 31 mL/min (A) (by C-G formula based on SCr of 1.26 mg/dL (H)).    Antimicrobial dosage has been changed to:  Levaquin 500mg  po x 1 dose then 250mg  po daily   Thank you for allowing pharmacy to be a part of this patient's care.  Everette Rank, [facility] 04/08/2018 12:25 PM

## 2018-04-08 NOTE — Progress Notes (Signed)
Occupational Therapy Treatment Patient Details Name: [name] MRN: [id number] DOB: January 27, 1946 Today's Date: 04/08/2018    History of present illness 73 yo female who sustained a non-displaced left hip fracture following a mechanical fall a week ago now s/p cannulated screw fixation/percutaneous pinning of left hip. PMHx: COPD   OT comments  RN aware of sats decreasing with activity.  Reinterated importance of use of flutter valve and incentive spiormeter  Follow Up Recommendations  No OT follow up;Supervision/Assistance - 24 hour    Equipment Recommendations  None recommended by OT    Recommendations for Other Services      Precautions / Restrictions Precautions Precautions: Fall Precaution Comments: monitor O2 (dropped to 88% on1L), Restrictions Weight Bearing Restrictions: No Other Position/Activity Restrictions: WBAT       Mobility Bed Mobility Overal bed mobility: Needs Assistance Bed Mobility: Supine to Sit     Supine to sit: Supervision        Transfers Overall transfer level: Needs assistance Equipment used: Rolling walker (2 wheeled) Transfers: Sit to/from [facility] Sit to Stand: Min guard Stand pivot transfers: Min guard       General transfer comment: Cues for safe hand placement and to steady        ADL either performed or assessed with clinical judgement   ADL Overall ADL's : Needs assistance/impaired     Grooming: Standing;Set up                   Toilet Transfer: Min guard;BSC;Comfort height toilet;RW   Toileting- Water quality scientist and Hygiene: Min guard;Sit to/from stand;Cueing for sequencing;Cueing for safety         General ADL Comments: Encouraged flutter valve as well as incentive spirometer as pts sats decreasing with activity. RN aware.      Vision Patient Visual Report: No change from baseline            Cognition Arousal/Alertness: Awake/alert Behavior During Therapy: WFL for  tasks assessed/performed Overall Cognitive Status: History of cognitive impairments - at baseline                                 General Comments: memory deficits                   Pertinent Vitals/ Pain       Pain Score: 4  Pain Location: left hip Pain Descriptors / Indicators: Sore Pain Intervention(s): Limited activity within patient's tolerance;Repositioned         Frequency  Min 2X/week        Progress Toward Goals  OT Goals(current goals can now be found in the care plan section)  Progress towards OT goals: Progressing toward goals     Plan Discharge plan remains appropriate       AM-PAC OT "6 Clicks" Daily Activity     Outcome Measure   Help from another person eating meals?: None Help from another person taking care of personal grooming?: A Little Help from another person toileting, which includes using toliet, bedpan, or urinal?: A Little Help from another person bathing (including washing, rinsing, drying)?: A Little Help from another person to put on and taking off regular upper body clothing?: A Little Help from another person to put on and taking off regular lower body clothing?: A Little 6 Click Score: 19    End of Session Equipment Utilized During Treatment: Gait belt;Rolling walker;Oxygen(2 liters)  OT Visit Diagnosis: Unsteadiness on feet (R26.81);Other abnormalities of gait and mobility (R26.89);History of falling (Z91.81);Pain Pain - Right/Left: Left Pain - part of body: Hip   Activity Tolerance Patient tolerated treatment well(pt limited by dizziness and O2 drop when on RA)   Patient Left in chair;with call bell/phone within reach;with chair alarm set   Nurse Communication Mobility status(dizziness and O2 drop on RA)        Time: 5364-6803 OT Time Calculation (min): 19 min  Charges: OT General Charges $OT Visit: 1 Visit OT Treatments $Self Care/Home Management : 8-22 mins  Kari Baars, [place] [phone] Office- [place], Edwena Felty D 04/08/2018, 2:03 PM

## 2018-04-08 NOTE — Progress Notes (Signed)
Dr. Ninfa Linden and Dr. Lorin Mercy made aware of pt overall breathing condition. Pt continues to have severe congested coughing with shortness of breath. New medications ordered and CXR to be obtained. Pt overall stable at this time with family at bedside though respiratory concern does remain.

## 2018-04-09 MED ORDER — ONE-DAILY MULTI VITAMINS PO TABS: ORAL_TABLET | ORAL | Status: DC

## 2018-04-09 MED ORDER — LEVOFLOXACIN 250 MG PO TABS: 250.0000 mg | ORAL_TABLET | Freq: Every day | ORAL | 0 refills | Status: DC

## 2018-04-09 MED ORDER — HYDROCODONE-ACETAMINOPHEN 5-325 MG PO TABS: 1.0000 | ORAL_TABLET | ORAL | 0 refills | Status: DC | PRN

## 2018-04-09 MED ORDER — ASPIRIN 325 MG PO TBEC: 325.0000 mg | DELAYED_RELEASE_TABLET | Freq: Every day | ORAL | 0 refills | Status: DC

## 2018-04-09 NOTE — Plan of Care (Signed)
Pt to d/c home today with home health and home 02. No needs at this time. Pain is well managed and family at bedside aware of plan of care.

## 2018-04-09 NOTE — Progress Notes (Signed)
Physical Therapy Treatment Patient Details Name: [name] MRN: [id number] DOB: [birth year] Today's Date: 04/09/2018    History of Present Illness 73 yo female who sustained a non-displaced left hip fracture following a mechanical fall a week ago now s/p cannulated screw fixation/percutaneous pinning of left hip. PMHx: COPD    PT Comments    Pt in good spirits with mod pain while ambulating.  Pt demonstrating increased stability and requiring fewer cues for safety and walker management vs previous sessions.   Follow Up Recommendations  Home health PT     Equipment Recommendations  3in1 (PT)    Recommendations for Other Services       Precautions / Restrictions Precautions Precautions: Fall Precaution Comments: monitor O2 - dropped to 89% on 2L Restrictions Weight Bearing Restrictions: No Other Position/Activity Restrictions: WBAT    Mobility  Bed Mobility               General bed mobility comments: Pt up in chair and requests back to same  Transfers Overall transfer level: Needs assistance Equipment used: Rolling walker (2 wheeled) Transfers: Sit to/from Stand Sit to Stand: Min guard;Supervision         General transfer comment: cues for safety and use of UEs to self assist  Ambulation/Gait Ambulation/Gait assistance: Min guard;Supervision Gait Distance (Feet): 250 Feet Assistive device: Rolling walker (2 wheeled) Gait Pattern/deviations: Step-through pattern;Decreased step length - right;Decreased step length - left;Shuffle;Narrow base of support;Antalgic Gait velocity: appropriate pace   General Gait Details: cues for posture, safety awareness and position from RW;    Stairs             Wheelchair Mobility    Modified Rankin (Stroke Patients Only)       Balance Overall balance assessment: Needs assistance Sitting-balance support: No upper extremity supported;Feet supported Sitting balance-Leahy Scale: Good     Standing balance  support: No upper extremity supported Standing balance-Leahy Scale: Fair                              Cognition Arousal/Alertness: Awake/alert Behavior During Therapy: WFL for tasks assessed/performed Overall Cognitive Status: History of cognitive impairments - at baseline                                 General Comments: memory deficits      Exercises      General Comments        Pertinent Vitals/Pain Pain Assessment: 0-10 Pain Score: 5  Pain Location: left hip Pain Descriptors / Indicators: Sore Pain Intervention(s): Limited activity within patient's tolerance;Monitored during session;Premedicated before session    Home Living                      Prior Function            PT Goals (current goals can now be found in the care plan section) Acute Rehab PT Goals Patient Stated Goal: home PT Goal Formulation: With patient Time For Goal Achievement: 04/14/18 Potential to Achieve Goals: Good Progress towards PT goals: Progressing toward goals    Frequency    Min 6X/week      PT Plan Current plan remains appropriate    Co-evaluation              AM-PAC PT "6 Clicks" Mobility   Outcome Measure  Help needed turning from  your back to your side while in a flat bed without using bedrails?: None Help needed moving from lying on your back to sitting on the side of a flat bed without using bedrails?: None Help needed moving to and from a bed to a chair (including a wheelchair)?: A Little Help needed standing up from a chair using your arms (e.g., wheelchair or bedside chair)?: A Little Help needed to walk in hospital room?: A Little Help needed climbing 3-5 steps with a railing? : A Little 6 Click Score: 20    End of Session Equipment Utilized During Treatment: Gait belt;Oxygen Activity Tolerance: Patient tolerated treatment well Patient left: in chair;with call bell/phone within reach;with family/visitor present Nurse  Communication: Mobility status PT Visit Diagnosis: Muscle weakness (generalized) (M62.81);Difficulty in walking, not elsewhere classified (R26.2);History of falling (Z91.81)     Time: 3790-2409 PT Time Calculation (min) (ACUTE ONLY): 18 min  Charges:  $Gait Training: 8-22 mins                     [place] Pager [phone] Office [phone]    Rashanda Magloire 04/09/2018, 8:29 AM

## 2018-04-09 NOTE — Progress Notes (Signed)
Pt stable at time of d/c. Pt medicated for pain prior to d/c. Pt has needed home equipment and home 02 is also arranged.

## 2018-04-09 NOTE — Progress Notes (Addendum)
   Subjective: 3 Days Post-Op Procedure(s) (LRB): CANNULATED SCREWS/HIP PINNING LEFT HIP (Left) Patient reports pain as mild.    Objective: Vital signs in last 24 hours: Temp:  [97.5 F (36.4 C)-97.6 F (36.4 C)] 97.5 F (36.4 C) (02/02 0549) Pulse Rate:  [78-80] 80 (02/02 0549) Resp:  [14-16] 16 (02/02 0549) BP: (116-127)/(64-88) 116/88 (02/02 0549) SpO2:  [96 %-100 %] 96 % (02/02 0549)  Intake/Output from previous day: 02/01 0701 - 02/02 0700 In: 960 [P.O.:960] Out: 3300 [Urine:3300] Intake/Output this shift: No intake/output data recorded.  Recent Labs    04/06/18 0900 04/07/18 0455 04/08/18 0444  HGB 11.1* 8.9* 8.9*   Recent Labs    04/07/18 0455 04/08/18 0444  WBC 11.6* 9.7  RBC 3.33* 3.41*  HCT 30.1* 31.1*  PLT 289 315   Recent Labs    04/07/18 0455 04/08/18 0444  NA 138 141  K 5.0 4.6  CL 107 110  CO2 24 26  BUN 16 16  CREATININE 1.34* 1.26*  GLUCOSE 161* 105*  CALCIUM 7.8* 8.1*   No results for input(s): LABPT, INR in the last 72 hours.  Neurologically intact Dg Chest Port 1 View  Result Date: 04/08/2018 CLINICAL DATA:  Shortness of breath, wheezing. EXAM: PORTABLE CHEST 1 VIEW COMPARISON:  Radiograph of April 13, 2014. FINDINGS: Stable cardiomediastinal silhouette. Atherosclerosis of thoracic aorta is noted. No definite pneumothorax or pleural effusion is noted. Stable bibasilar opacities are noted most consistent with scarring or subsegmental atelectasis. Bony thorax is unremarkable. IMPRESSION: Stable bibasilar scarring or subsegmental atelectasis. Aortic Atherosclerosis (ICD10-I70.0). Electronically Signed   By: Marijo Conception, M.D.   On: 04/08/2018 15:54    Assessment/Plan: 3 Days Post-Op Procedure(s) (LRB): CANNULATED SCREWS/HIP PINNING LEFT HIP (Left) Up with therapy, walking in hall with therapist. Home today.daughter at bedside. Pt and daughter want to leave today. O2 sats 88-90. Will need follow up with LMD and Home O2 will be  ordered now for COPD. She was placed on it a few years ago but she refused to use it. Marland Kitchenagrees to use it now.   Marybelle Killings 04/09/2018, 8:22 AM

## 2018-04-09 NOTE — Discharge Instructions (Signed)
Hip Fracture  A hip fracture is a break in the upper part of the thigh bone (femur). This is usually the result of an injury, commonly a fall. What are the causes? This condition may be caused by:  A direct hit or injury (trauma) to the side of the hip, such as from a fall or a car accident. What increases the risk? You are more likely to develop this condition if:  You have poor balance or an unsteady walking pattern (gait). Certain conditions contribute to poor balance, including Parkinson disease and dementia.  You have thinning or weakening of your bones, such as from osteopenia or osteoporosis.  You have cancer that spreads to the leg bones.  You have certain conditions that can weaken your bones, such as thyroid disorders, intestine disorders, or a lack (deficiency) of certain nutrients.  You smoke.  You take certain medicines, such as steroids.  You have a history of broken bones. What are the signs or symptoms? Symptoms of this condition include:  Pain over the injured hip. This is commonly felt on the side of the hip or in the front groin area.  Stiffness, bruising, and swelling over the hip.  Pain with movement of the leg, especially lifting it up. Pain often gets better with rest.  Difficulty or inability to stand, walk, or use the leg to support body weight (put weight on the leg).  The leg rolling outward when lying down.  The affected leg being shorter than the other leg. How is this diagnosed? This condition may be diagnosed based on:  Your symptoms.  A physical exam.  X-rays. These may be done: ? To confirm the diagnosis. ? To determine the type and location of the fracture. ? To check for other injuries.  MRI or CT scans. These may be done if the fracture is not visible on an X-ray. How is this treated? Treatment for this condition depends on the severity and location of your fracture. In most cases, surgery is necessary. Surgery may  involve:  Repairing the fracture with a screw, nail, or rod to hold the bone in place (open reduction and internal fixation, ORIF).  Replacing the damaged parts of the femur with metal implants (hemiarthroplasty or arthroplasty). If your fracture is less severe, or if you are not eligible for surgery, you may have non-surgical treatment. Non-surgical treatment may involve:  Using crutches, a walker, or a wheelchair until your health care provider says that you can support (bear) weight on your hip.  Medicines to help reduce pain and swelling.  Having regular X-rays to monitor your fracture and make sure that it is healing.  Physical therapy. You may need physical therapy after surgery, too. Follow these instructions at home: Activity  Do not use your injured leg to support your body weight until your health care provider says that you can. ? Follow standing and walking restrictions as told by your health care provider. ? Use crutches, a walker, or a wheelchair as directed.  Avoid any activities that cause pain or irritation in your hip. Ask your health care provider what activities are safe for you.  Do not drive or use heavy machinery until your health care provider approves.  If physical therapy was prescribed, do exercises as told by your health care provider. General instructions  Take over-the-counter and prescription medicines only as told by your health care provider.  If directed, put ice on the injured area: ? Put ice in a plastic bag. ?   Place a towel between your skin and the bag. ? Leave the ice on for 20 minutes, 2-3 times a day.  Do not use any products that contain nicotine or tobacco, such as cigarettes and e-cigarettes. These can delay bone healing. If you need help quitting, ask your health care provider.  Keep all follow-up visits as told by your health care provider. This is important. How is this prevented?  To prevent falls at home: ? Use a cane, walker,  or wheelchair as directed. ? Make sure your rooms and hallways are free of clutter, obstacles, and cords. ? Install grab bars in your bedroom and bathrooms. ? Always use handrails when going up and down stairs. ? Use nightlights around the house.  Exercise regularly. Ask what forms of exercise are safe for you, such as walking and strength and balance exercises.  Visit an eye doctor regularly to have your eyesight checked. This can help prevent falls.  Make sure you get enough calcium and vitamin D.  Do not use any products that contain nicotine or tobacco, such as cigarettes and e-cigarettes. If you need help quitting, ask your health care provider.  Limit alcohol use.  If you have an underlying condition that caused your hip fracture, work with your health care provider to manage your condition. Contact a health care provider if:  Your pain gets worse or it does not get better with rest or medicine.  You develop any of the following in your leg or foot: ? Numbness. ? Tingling. ? A change in skin color (discoloration). ? Skin feeling cold to the touch. Get help right away if:  Your pain suddenly gets worse.  You cannot move your hip. Summary  A hip fracture is a break in the upper part of the thigh bone (femur).  Treatment typically require surgical management to restore stability and function to the hip.  Pain medicine and icing of the affected leg can help manage pain and swelling. Follow directions as told by your health care provider. This information is not intended to replace advice given to you by your health care provider. Make sure you discuss any questions you have with your health care provider. Document Released: 02/22/2005 Document Revised: 11/12/2017 Document Reviewed: 03/27/2016 Elsevier Interactive Patient Education  2019 [employer]  

## 2018-04-09 NOTE — Progress Notes (Signed)
SATURATION QUALIFICATIONS: (This note is used to comply with regulatory documentation for home oxygen)  Patient Saturations on Room Air at Rest =87%  Patient Saturations on Room Air while Ambulating = 85%  Patient Saturations on 2 Liters of oxygen while Ambulating = 93%  Please briefly explain why patient needs home oxygen: pt needs home 02 to help with breathing and maintain 02 sat at d/c

## 2018-04-09 NOTE — Care Management Important Message (Signed)
Important Message  Patient Details  Name: [name] MRN: [id number] Date of Birth: 1945/04/01   Medicare Important Message Given:  Yes    Erenest Rasher, RN 04/09/2018, 11:07 AM

## 2018-04-09 NOTE — Care Management Note (Addendum)
Case Management Note  Patient Details  Name: [name] MRN: [id number] Date of Birth: [birth year]  Subjective/Objective:   Mechanical fall s/p cannulated screw fixation/percutaneous pinning of left hip, COPD                 Action/Plan: NCM spoke to pt and offered choice for HH/CMS list provided and placed on chart. Pt wanted to use [name], [facility] # [phone], fax [phone]. Contacted Joe and states he will bill from [facility] [place] and she would have a copay. Recommends she uses a [facility] for [place]. Pt agreeable to [facility] for [facility]. Contacted [facility] with new referral. Contacted [facility] with referral for oxygen and 3n1 bedside commode. Portable oxygen delivered to room. Will deliver 3n1 bc and oxygen concentrator to home. Has RW and neb machine at home. Dtr and husband will assist at home.   Expected Discharge Date:  04/09/18               Expected Discharge Plan:  [place]  In-House Referral:  NA  Discharge planning Services  CM Consult  Post Acute Care Choice:  Home Health Choice offered to:  Patient, Adult Children  DME Arranged:  3-N-1, Oxygen DME Agency:  [facility]  HH Arranged:  PT, Nurse's Aide [place] Agency:  [facility]  Status of Service:  Completed, signed off  If discussed at [place] of Stay Meetings, dates discussed:    Additional Comments:  Erenest Rasher, RN 04/09/2018, 11:48 AM

## 2018-04-10 ENCOUNTER — Telehealth: Payer: Self-pay

## 2018-04-10 NOTE — Telephone Encounter (Signed)
Per d/c summary, patient to f/u with PCP regarding home oxygen. Appt scheduled 04/24/18 @ 11:15AM. Patient and spouse aware.   Patient's spouse requested refill for Albuterol neb solution. Request sent to PCP for review.

## 2018-04-10 NOTE — Discharge Summary (Signed)
Patient ID: [name] MRN: [id number] DOB/AGE: December 19, 1945 73 y.o.  Admit date: 04/06/2018 Discharge date: 04/10/2018  Admission Diagnoses:  Principal Problem:   Traumatic closed nondisplaced fracture of neck of left femur ([place]) Active Problems:   Hip fracture requiring operative repair, left, closed, initial encounter [facility])   Discharge Diagnoses:  Left hip fracture status post cannulated pinning Acute on chronic anemia  COPD with exacerbation   Past Medical History:  Diagnosis Date  . Anxiety   . Asthma   . Breast cancer [facility])    h/o- radiation  . Carcinoid tumor of lung 1991  . COPD (chronic obstructive pulmonary disease) ([place])   . Depression   . Diabetes mellitus type II   . Diverticulosis   . Dyspnea    with exertion   . Gastroparesis   . GERD (gastroesophageal reflux disease)   . History of adenomatous polyp of colon   . History of breast cancer   . Hyperlipidemia   . Mild cognitive impairment   . Osteoarthritis   . Pneumonia   . Pulmonary nodule    multiple  . Renal cell carcinoma     Surgeries: Procedure(s): CANNULATED SCREWS/HIP PINNING LEFT HIP on 04/06/2018   Consultants:   Discharged Condition: Improved  Hospital Course: [name] is an 73 y.o. female who was admitted 04/06/2018 for operative treatment ofTraumatic closed nondisplaced fracture of neck of left femur ([place]). Patient has severe unremitting pain that affects sleep, daily activities, and work/hobbies. After pre-op clearance the patient was taken to the operating room on 04/06/2018 and underwent  Procedure(s): CANNULATED SCREWS/HIP PINNING LEFT HIP.    Patient was given perioperative antibiotics:  Anti-infectives (From admission, onward)   Start     Dose/Rate Route Frequency Ordered Stop   04/09/18 1500  levofloxacin (LEVAQUIN) tablet 250 mg  Status:  Discontinued     250 mg Oral Daily 04/08/18 1228 04/09/18 1518   04/09/18 0000  levofloxacin (LEVAQUIN) 250 MG tablet     250 mg  Oral Daily 04/09/18 0843     04/08/18 1300  levofloxacin (LEVAQUIN) tablet 500 mg     500 mg Oral  Once 04/08/18 1228 04/08/18 1256   04/08/18 1230  levofloxacin (LEVAQUIN) tablet 500 mg  Status:  Discontinued     500 mg Oral Daily 04/08/18 1222 04/08/18 1227   04/06/18 1730  clindamycin (CLEOCIN) IVPB 600 mg     600 mg 100 mL/hr over 30 Minutes Intravenous Every 6 hours 04/06/18 1536 04/06/18 2348   04/06/18 1054  clindamycin (CLEOCIN) 900 MG/50ML IVPB    Note to Pharmacy:  Alfonso Patten   : cabinet override      04/06/18 1054 04/06/18 1125   04/06/18 0830  clindamycin (CLEOCIN) IVPB 900 mg     900 mg 100 mL/hr over 30 Minutes Intravenous On call to O.R. 04/06/18 9485 04/06/18 1125       Patient was given sequential compression devices, early ambulation, and chemoprophylaxis to prevent DVT.  Patient benefited maximally from hospital stay and there were no complications.  She did require home oxygen .   Recent vital signs: No data found.   Recent laboratory studies:  Recent Labs    04/08/18 0444  WBC 9.7  HGB 8.9*  HCT 31.1*  PLT 315  NA 141  K 4.6  CL 110  CO2 26  BUN 16  CREATININE 1.26*  GLUCOSE 105*  CALCIUM 8.1*     Discharge Medications:   Allergies as of 04/09/2018  Reactions   Budesonide-formoterol Fumarate Other (See Comments)   coughed worse   Lipitor [atorvastatin] Other (See Comments)   Nosebleeds   Penicillins Rash   Did it involve swelling of the face/tongue/throat, SOB, or low BP? No Did it involve sudden or severe rash/hives, skin peeling, or any reaction on the inside of your mouth or nose? No Did you need to seek medical attention at a hospital or doctor's office? No When did it last happen?childhood allergy If all above answers are "NO", may proceed with cephalosporin use.      Medication List    STOP taking these medications   acetaminophen 500 MG tablet Commonly known as:  TYLENOL     TAKE these medications   albuterol 108  (90 Base) MCG/ACT inhaler Commonly known as:  PROVENTIL HFA;VENTOLIN HFA Inhale 2 puffs into the lungs every 6 (six) hours as needed.   aspirin 325 MG EC tablet Take 1 tablet (325 mg total) by mouth daily with breakfast.   bismuth subsalicylate 546 TK/35WS suspension Commonly known as:  PEPTO BISMOL Take 30 mLs by mouth every 6 (six) hours as needed for indigestion.   cholecalciferol 25 MCG (1000 UT) tablet Commonly known as:  VITAMIN D3 Take 1,000 Units by mouth daily.   fexofenadine 180 MG tablet Commonly known as:  ALLEGRA Take 180 mg by mouth daily as needed (nasal drainage).   Fish Oil 1200 MG Caps Take 1,200 mg by mouth daily.   fluticasone 50 MCG/ACT nasal spray Commonly known as:  FLONASE USE 2 SPRAYS NASALLY DAILY What changed:    how much to take  how to take this  when to take this  additional instructions   HYDROcodone-acetaminophen 5-325 MG tablet Commonly known as:  NORCO/VICODIN Take 1-2 tablets by mouth every 4 (four) hours as needed for moderate pain (pain score 4-6).   levofloxacin 250 MG tablet Commonly known as:  LEVAQUIN Take 1 tablet (250 mg total) by mouth daily at 3 pm.   metFORMIN 500 MG tablet Commonly known as:  GLUCOPHAGE Take 1 tablet (500 mg total) by mouth 2 (two) times daily with a meal.   mometasone-formoterol 200-5 MCG/ACT Aero Commonly known as:  DULERA Take 2 puffs first thing in am and then another 2 puffs about 12 hours later.   multivitamin tablet Juice plus       Take one  By mouth daily What changed:    how much to take  how to take this  when to take this  additional instructions   polyethylene glycol powder powder Commonly known as:  GLYCOLAX/MIRALAX Take 17 grams (1 capful) every morning and 9 grams (1/2 capful) every evening What changed:    how much to take  how to take this  when to take this  reasons to take this  additional instructions   pravastatin 40 MG tablet Commonly known as:   PRAVACHOL TAKE 1 TABLET DAILY   pregabalin 75 MG capsule Commonly known as:  LYRICA Take 1 capsule (75 mg total) by mouth 3 (three) times daily. What changed:  when to take this   tiotropium 18 MCG inhalation capsule Commonly known as:  SPIRIVA HANDIHALER INHALE THE CONTENTS OF 1   CAPSULE VIA HANDIHALER     DAILY       Diagnostic Studies: Pelvis Portable  Result Date: 04/06/2018 CLINICAL DATA:  Status post left hip pinning. EXAM: PORTABLE PELVIS 1-2 VIEWS COMPARISON:  Fluoroscopic images of same day. FINDINGS: Three surgical pins are seen in  the proximal left femoral neck and intertrochanteric region. Good alignment of fracture components is noted. No dislocation is noted. Right hip appears normal. IMPRESSION: Status post surgical pinning proximal left femoral neck and intertrochanteric region. Electronically Signed   By: Marijo Conception, M.D.   On: 04/06/2018 13:24   Ct Hip Left Wo Contrast  Result Date: 04/05/2018 CLINICAL DATA:  Persistent left hip pain and difficulty bearing weight since fall 9 days ago. EXAM: CT OF THE LEFT HIP WITHOUT CONTRAST TECHNIQUE: Multidetector CT imaging of the left hip was performed according to the standard protocol. Multiplanar CT image reconstructions were also generated. COMPARISON:  Left hip x-rays from same day. CT abdomen pelvis dated May 20, 2011. FINDINGS: Bones/Joint/Cartilage Minimally impacted subcapital left femoral neck fracture. No dislocation. Small well-defined lucent lesion in the left femoral neck is unchanged since 2013 and likely represents a synovial herniation pit. No joint effusion. Ligaments Suboptimally assessed by CT. Muscles and Tendons Intact. Soft tissues Small subcutaneous hematoma along the posterolateral left hip. Vascular calcifications. IMPRESSION: 1. Minimally impacted subcapital left femoral neck fracture. These results will be called to the ordering clinician or representative by the Radiologist Assistant, and communication  documented in the PACS or zVision Dashboard. Electronically Signed   By: Titus Dubin M.D.   On: 04/05/2018 12:12   Dg Chest Port 1 View  Result Date: 04/08/2018 CLINICAL DATA:  Shortness of breath, wheezing. EXAM: PORTABLE CHEST 1 VIEW COMPARISON:  Radiograph of April 13, 2014. FINDINGS: Stable cardiomediastinal silhouette. Atherosclerosis of thoracic aorta is noted. No definite pneumothorax or pleural effusion is noted. Stable bibasilar opacities are noted most consistent with scarring or subsegmental atelectasis. Bony thorax is unremarkable. IMPRESSION: Stable bibasilar scarring or subsegmental atelectasis. Aortic Atherosclerosis (ICD10-I70.0). Electronically Signed   By: Marijo Conception, M.D.   On: 04/08/2018 15:54   Dg C-arm 1-60 Min-no Report  Result Date: 04/06/2018 Fluoroscopy was utilized by the requesting physician.  No radiographic interpretation.   Dg Hip Operative Unilat With Pelvis Left  Result Date: 04/06/2018 CLINICAL DATA:  Intra op left hip pinning. EXAM: OPERATIVE LEFT HIP (WITH PELVIS IF PERFORMED)  VIEWS TECHNIQUE: Fluoroscopic spot image(s) were submitted for interpretation post-operatively. COMPARISON:  None. FLUOROSCOPY TIME:  1 min 20 sec FINDINGS: Interval ORIF left femoral neck fracture transfixed with 3 cannulated screws. Normal alignment. IMPRESSION: Interval ORIF left femoral neck fracture transfixed with 3 cannulated screws. Electronically Signed   By: Kathreen Devoid   On: 04/06/2018 14:06   Xr Hip Unilat W Or W/o Pelvis 1v Left  Result Date: 04/05/2018 AP pelvis lateral view left hip: Bilateral hips well located hips are overall well-maintained.  Right hip no acute fractures no bone abnormalities.  Subtle subcapital irregularity in the cortices of left hip suspicious for subcapital hip fracture nondisplaced.   Disposition:     Follow-up Information    Mcarthur Rossetti, MD Follow up in 2 week(s).   Specialty:  Orthopedic Surgery Contact  information: [place] [address] [phone]        Viviana Simpler I, MD Follow up in 2 week(s).   Specialties:  Internal Medicine, Pediatrics Why:  see Dr. Silvio Pate in a couple weeks for follow up about home oxygen Contact information: [place] [address] [place] [place] Follow up.   Why:  will deliver oxygen Contact information: [place] [address] [phone]  [place] Follow up.   Why:  [place] will call to arrange visit Contact information: [place] [phone]           Signed: Erskine Emery 04/10/2018, 9:49 AM

## 2018-04-11 ENCOUNTER — Telehealth: Payer: Self-pay

## 2018-04-11 MED ORDER — ALBUTEROL SULFATE (2.5 MG/3ML) 0.083% IN NEBU: 2.5000 mg | INHALATION_SOLUTION | Freq: Four times a day (QID) | RESPIRATORY_TRACT | 1 refills | Status: AC | PRN

## 2018-04-11 NOTE — Telephone Encounter (Signed)
-----   Message from Venia Carbon, MD sent at 04/11/2018  8:29 AM EST ----- Regarding: FW: TCM??? Okay to fill neb Rx ----- Message ----- From: Eustace Pen, LPN Sent: 08/11/4401   4:13 PM EST To: Venia Carbon, MD Subject: RE: TCM???                                     Scheduled appt for 2/17 @ 1115. The 16th is a Sunday.  Patient's spouse requested the following medication:  Albuterol neb solution 2.5 mg/3 mL. Last administered while IP during recent hospitalization.   Patient was last prescribed neb solution in 2016 per spouse.   Pharmacy of choice is [facility], [place], [place]. ([address])  Thank you, Katha Cabal ----- Message ----- From: Venia Carbon, MD Sent: 04/10/2018   2:37 PM EST To: Eustace Pen, LPN Subject: RE: TCM???                                     I don't think she gets TCM but she apparently needs new home oxygen. She should have a follow up with me soon (2/16 okay) ----- Message ----- From: Eustace Pen, LPN Sent: 06/12/4257   4:52 PM EST To: Venia Carbon, MD Subject: TCM???                                         Dr. Silvio Pate,  Patient recently discharged. Advised to f/u with ortho. Also states in D/C summary to see you in 2 wks for home oxygen. Do you wish for me to do TCM and request her to come in before 04/23/18? Please advise.

## 2018-04-11 NOTE — Telephone Encounter (Signed)
Verified how she is taking it and sent in rx.

## 2018-04-13 ENCOUNTER — Other Ambulatory Visit: Payer: Self-pay | Admitting: Internal Medicine

## 2018-04-13 MED ORDER — FUROSEMIDE 20 MG PO TABS: 20.0000 mg | ORAL_TABLET | Freq: Every day | ORAL | 0 refills | Status: DC | PRN

## 2018-04-13 NOTE — Telephone Encounter (Signed)
Okay to send Rx for furosemide 20mg   #20 x 0 1 daily prn for leg swelling

## 2018-04-13 NOTE — Telephone Encounter (Signed)
I was having trouble with Epic putting the medication in, but it has been sent to the pharmacy.

## 2018-04-14 ENCOUNTER — Other Ambulatory Visit: Payer: Self-pay | Admitting: Internal Medicine

## 2018-04-14 ENCOUNTER — Encounter (INDEPENDENT_AMBULATORY_CARE_PROVIDER_SITE_OTHER): Payer: Self-pay | Admitting: Orthopaedic Surgery

## 2018-04-20 ENCOUNTER — Ambulatory Visit (INDEPENDENT_AMBULATORY_CARE_PROVIDER_SITE_OTHER): Payer: Medicare Other | Admitting: Physician Assistant

## 2018-04-20 ENCOUNTER — Encounter (INDEPENDENT_AMBULATORY_CARE_PROVIDER_SITE_OTHER): Payer: Self-pay | Admitting: Physician Assistant

## 2018-04-20 ENCOUNTER — Other Ambulatory Visit (INDEPENDENT_AMBULATORY_CARE_PROVIDER_SITE_OTHER): Payer: Self-pay | Admitting: Physician Assistant

## 2018-04-20 ENCOUNTER — Ambulatory Visit (INDEPENDENT_AMBULATORY_CARE_PROVIDER_SITE_OTHER): Payer: Self-pay

## 2018-04-20 DIAGNOSIS — M25552 Pain in left hip: Secondary | ICD-10-CM

## 2018-04-20 DIAGNOSIS — S72002A Fracture of unspecified part of neck of left femur, initial encounter for closed fracture: Secondary | ICD-10-CM

## 2018-04-20 MED ORDER — HYDROCODONE-ACETAMINOPHEN 5-325 MG PO TABS: 1.0000 | ORAL_TABLET | ORAL | 0 refills | Status: DC | PRN

## 2018-04-20 MED ORDER — METHOCARBAMOL 500 MG PO TABS: 500.0000 mg | ORAL_TABLET | Freq: Three times a day (TID) | ORAL | 1 refills | Status: DC

## 2018-04-20 NOTE — Progress Notes (Signed)
HPI: Ms. Dixson returns now 14 days status post cannulated pinning of her left hip fracture.  She states overall she is doing well but over the last few days she has had increased pain in the hip.  Said no new injury.  Disease taking a very occasional hydrocodone.  She ambulates today in a wheelchair.  Physical exam: General well-developed well-nourished female no acute distress. Psych: Alert and oriented x3 Left lower extremity: Surgical incisions healing well no signs of infection well approximated with staples.  Left calf supple nontender.  Dorsiflexion plantarflexion ankle intact.  She is nontender about the left lower leg.  Good range of motion of the left hip with minimal discomfort.  Radiographs: AP pelvis and lateral view of the left hip show patient be status post cannulated pinning of a left hip fracture.  Overall excellent position alignment.  No hardware failure.  No other fractures identified.  Impression: 2 weeks status post cannulated pinning of left hip fracture  Plan: She will work with physical therapy at home to work on range of motion strengthening gait balance.  Staples were removed today Steri-Strips applied.  We will see her back in 1 month at that time obtain AP pelvis lateral view of the left hip.  Questions encouraged and answered at length today.  Refill on her pain medicine and she is given a muscle relaxant also.

## 2018-04-24 ENCOUNTER — Ambulatory Visit: Payer: Medicare Other | Admitting: Internal Medicine

## 2018-04-25 DIAGNOSIS — J449 Chronic obstructive pulmonary disease, unspecified: Secondary | ICD-10-CM | POA: Diagnosis not present

## 2018-04-25 DIAGNOSIS — R269 Unspecified abnormalities of gait and mobility: Secondary | ICD-10-CM | POA: Diagnosis not present

## 2018-04-25 DIAGNOSIS — M6281 Muscle weakness (generalized): Secondary | ICD-10-CM | POA: Diagnosis not present

## 2018-04-26 MED ORDER — MOMETASONE FURO-FORMOTEROL FUM 200-5 MCG/ACT IN AERO: INHALATION_SPRAY | RESPIRATORY_TRACT | 3 refills | Status: AC

## 2018-04-26 NOTE — Telephone Encounter (Signed)
Please take care of this.  

## 2018-05-02 ENCOUNTER — Ambulatory Visit (INDEPENDENT_AMBULATORY_CARE_PROVIDER_SITE_OTHER): Payer: Medicare Other | Admitting: Internal Medicine

## 2018-05-02 ENCOUNTER — Encounter: Payer: Self-pay | Admitting: Internal Medicine

## 2018-05-02 VITALS — BP 110/56 | HR 76 | Temp 97.5°F | Ht 62.0 in | Wt 123.0 lb

## 2018-05-02 DIAGNOSIS — J9611 Chronic respiratory failure with hypoxia: Secondary | ICD-10-CM | POA: Insufficient documentation

## 2018-05-02 DIAGNOSIS — M6281 Muscle weakness (generalized): Secondary | ICD-10-CM | POA: Diagnosis not present

## 2018-05-02 DIAGNOSIS — R269 Unspecified abnormalities of gait and mobility: Secondary | ICD-10-CM | POA: Diagnosis not present

## 2018-05-02 DIAGNOSIS — L97911 Non-pressure chronic ulcer of unspecified part of right lower leg limited to breakdown of skin: Secondary | ICD-10-CM | POA: Diagnosis not present

## 2018-05-02 DIAGNOSIS — S72002D Fracture of unspecified part of neck of left femur, subsequent encounter for closed fracture with routine healing: Secondary | ICD-10-CM | POA: Diagnosis not present

## 2018-05-02 DIAGNOSIS — E2839 Other primary ovarian failure: Secondary | ICD-10-CM | POA: Diagnosis not present

## 2018-05-02 DIAGNOSIS — J449 Chronic obstructive pulmonary disease, unspecified: Secondary | ICD-10-CM | POA: Diagnosis not present

## 2018-05-02 NOTE — Assessment & Plan Note (Signed)
Mild exacerbation while in hospital Mostly better now

## 2018-05-02 NOTE — Assessment & Plan Note (Signed)
From the trauma with the fall No inflammation and seems to be granulating

## 2018-05-02 NOTE — Assessment & Plan Note (Signed)
Doing well after repair Still on home PT Rare hydrocodone now

## 2018-05-02 NOTE — Progress Notes (Signed)
Subjective:    Patient ID: [name], female    DOB: 1946/02/22, 73 y.o.   MRN: [id number]  HPI Here for hospital follow up Hip is better Still getting home PT [facility] with walker Able to transfer independently Pain is intermittent--rarely taking the hydrocodone  Had congestion while in hospital  Oximetry showed hypoxemia at night ---if not during day also Reviewed PT notes---had desats to 89% even on the 2liters of oxygen before discharge Unclear if they did overnight testing  Some cough Breathing is still not great Is using the oxygen at night--does feel better with it on  Having some fluid leaking from right leg (opposite the fracture) Did have this on left also at first--this is better  Current Outpatient Medications on File Prior to Visit  Medication Sig Dispense Refill  . albuterol (PROVENTIL HFA;VENTOLIN HFA) 108 (90 Base) MCG/ACT inhaler Inhale 2 puffs into the lungs every 6 (six) hours as needed.    Marland Kitchen albuterol (PROVENTIL) (2.5 MG/3ML) 0.083% nebulizer solution Take 3 mLs (2.5 mg total) by nebulization every 6 (six) hours as needed for wheezing or shortness of breath. 150 mL 1  . aspirin EC 325 MG EC tablet Take 1 tablet (325 mg total) by mouth daily with breakfast. 30 tablet 0  . bismuth subsalicylate (PEPTO BISMOL) 262 MG/15ML suspension Take 30 mLs by mouth every 6 (six) hours as needed for indigestion.    . cholecalciferol (VITAMIN D3) 25 MCG (1000 UT) tablet Take 1,000 Units by mouth daily.    . fexofenadine (ALLEGRA) 180 MG tablet Take 180 mg by mouth daily as needed (nasal drainage).    . fluticasone (FLONASE) 50 MCG/ACT nasal spray USE 2 SPRAYS NASALLY DAILY (Patient taking differently: Place 2 sprays into both nostrils daily. ) 48 g 3  . furosemide (LASIX) 20 MG tablet Take 1 tablet (20 mg total) by mouth daily as needed. 30 tablet 0  . HYDROcodone-acetaminophen (NORCO/VICODIN) 5-325 MG tablet Take 1-2 tablets by mouth every 4 (four) hours as needed for  moderate pain (pain score 4-6). 40 tablet 0  . metFORMIN (GLUCOPHAGE) 500 MG tablet TAKE 1 TABLET TWICE A DAY  WITH MEALS 180 tablet 0  . mometasone-formoterol (DULERA) 200-5 MCG/ACT AERO Take 2 puffs first thing in am and then another 2 puffs about 12 hours later. 3 Inhaler 3  . Multiple Vitamin (MULTIVITAMIN) tablet Juice plus       Take one  By mouth daily    . Omega-3 Fatty Acids (FISH OIL) 1200 MG CAPS Take 1,200 mg by mouth daily.     . polyethylene glycol powder (GLYCOLAX/MIRALAX) powder Take 17 grams (1 capful) every morning and 9 grams (1/2 capful) every evening (Patient taking differently: Take 17 g by mouth daily as needed for moderate constipation. ) 850 g 10  . pravastatin (PRAVACHOL) 40 MG tablet TAKE 1 TABLET DAILY 90 tablet 0  . pregabalin (LYRICA) 75 MG capsule Take 1 capsule (75 mg total) by mouth 3 (three) times daily. (Patient taking differently: Take 75 mg by mouth 2 (two) times daily. ) 270 capsule 3  . tiotropium (SPIRIVA HANDIHALER) 18 MCG inhalation capsule INHALE THE CONTENTS OF 1   CAPSULE VIA HANDIHALER     DAILY 90 capsule 3   No current facility-administered medications on file prior to visit.     Allergies  Allergen Reactions  . Budesonide-Formoterol Fumarate Other (See Comments)    coughed worse  . Lipitor [Atorvastatin] Other (See Comments)    Nosebleeds  .  Penicillins Rash    Did it involve swelling of the face/tongue/throat, SOB, or low BP? No Did it involve sudden or severe rash/hives, skin peeling, or any reaction on the inside of your mouth or nose? No Did you need to seek medical attention at a hospital or doctor's office? No When did it last happen?childhood allergy If all above answers are "NO", may proceed with cephalosporin use.     Past Medical History:  Diagnosis Date  . Anxiety   . Asthma   . Breast cancer [facility])    h/o- radiation  . Carcinoid tumor of lung 1991  . COPD (chronic obstructive pulmonary disease) ([place])   . Depression    . Diabetes mellitus type II   . Diverticulosis   . Dyspnea    with exertion   . Gastroparesis   . GERD (gastroesophageal reflux disease)   . History of adenomatous polyp of colon   . History of breast cancer   . Hyperlipidemia   . Mild cognitive impairment   . Osteoarthritis   . Pneumonia   . Pulmonary nodule    multiple  . Renal cell carcinoma     Past Surgical History:  Procedure Laterality Date  . BREAST LUMPECTOMY  1998   right breast  . CHOLECYSTECTOMY  1975  . HIP PINNING,CANNULATED Left 04/06/2018   Procedure: CANNULATED SCREWS/HIP PINNING LEFT HIP;  Surgeon: Mcarthur Rossetti, MD;  Location: [facility];  Service: Orthopedics;  Laterality: Left;  . LAPAROSCOPIC NEPHRECTOMY  01/2009   right  . LUNG SURGERY  1994   carcinoid removal  . TONSILLECTOMY AND ADENOIDECTOMY  1953    Family History  Problem Relation Age of Onset  . Colon cancer Father   . Lung cancer Father   . Stroke Mother   . Breast cancer Neg Hx   . Ovarian cancer Neg Hx   . Diabetes Neg Hx     Social History   Socioeconomic History  . Marital status: Married    Spouse name: Not on file  . Number of children: 2  . Years of education: Not on file  . Highest education level: Not on file  Occupational History  . Occupation: retired Designer, jewellery: RETIRED  Social Needs  . Financial resource strain: Not on file  . Food insecurity:    Worry: Not on file    Inability: Not on file  . Transportation needs:    Medical: Not on file    Non-medical: Not on file  Tobacco Use  . Smoking status: Former Smoker    Packs/day: 1.00    Years: 30.00    Pack years: 30.00    Types: Cigarettes    Last attempt to quit: 03/08/1993    Years since quitting: 25.1  . Smokeless tobacco: Never Used  Substance and Sexual Activity  . Alcohol use: No  . Drug use: No  . Sexual activity: Not Currently    Birth control/protection: Post-menopausal  Lifestyle  . Physical activity:    Days per week: Not on  file    Minutes per session: Not on file  . Stress: Not on file  Relationships  . Social connections:    Talks on phone: Not on file    Gets together: Not on file    Attends religious service: Not on file    Active member of club or organization: Not on file    Attends meetings of clubs or organizations: Not on file    Relationship status:  Not on file  . Intimate partner violence:    Fear of current or ex partner: Not on file    Emotionally abused: Not on file    Physically abused: Not on file    Forced sexual activity: Not on file  Other Topics Concern  . Not on file  Social History Narrative   Has living will   Requests husband as health care POA--daughter [name] would be alternate   Now has DNR-- done 2/19   No tube feeds if cognitively unaware         Review of Systems  Some stomach upset Eating okay Sleeps well      Objective:   Physical Exam  Constitutional: No distress.  Neck: No thyromegaly present.  Cardiovascular: Normal rate, regular rhythm and normal heart sounds. Exam reveals no gallop.  No murmur heard. Respiratory: Effort normal. No respiratory distress. She has no wheezes. She has no rales.  Decreased breath sounds but clear  Musculoskeletal:     Comments: No sig edema  Lymphadenopathy:    She has no cervical adenopathy.  Skin:  Small granulated horizontal moon shaped ulcer on anterior low right calf--no inflammation           Assessment & Plan:

## 2018-05-02 NOTE — Assessment & Plan Note (Signed)
Mostly nocturnal hypoxia but also desaturated with walking with PT while in the hospital Can change to nightly and prn in day

## 2018-05-04 ENCOUNTER — Telehealth: Payer: Self-pay | Admitting: Gastroenterology

## 2018-05-04 NOTE — Telephone Encounter (Signed)
Pt scheduled to see Tye Savoy NP 05/09/18@2pm . Husband aware of appt.

## 2018-05-04 NOTE — Telephone Encounter (Signed)
Patient husband would like for pt to be seen sooner than first available 03/10 PA. States that wife has severe nausea and diarrhea, a lot heartburn and indigestion. Husband states that she has become anemic because of it.

## 2018-05-09 ENCOUNTER — Other Ambulatory Visit (INDEPENDENT_AMBULATORY_CARE_PROVIDER_SITE_OTHER): Payer: Medicare Other

## 2018-05-09 ENCOUNTER — Ambulatory Visit (INDEPENDENT_AMBULATORY_CARE_PROVIDER_SITE_OTHER): Payer: Medicare Other | Admitting: Nurse Practitioner

## 2018-05-09 ENCOUNTER — Encounter: Payer: Self-pay | Admitting: Nurse Practitioner

## 2018-05-09 VITALS — BP 122/74 | HR 71 | Ht 64.0 in | Wt 124.5 lb

## 2018-05-09 DIAGNOSIS — Z86010 Personal history of colonic polyps: Secondary | ICD-10-CM

## 2018-05-09 DIAGNOSIS — R110 Nausea: Secondary | ICD-10-CM

## 2018-05-09 DIAGNOSIS — R197 Diarrhea, unspecified: Secondary | ICD-10-CM

## 2018-05-09 DIAGNOSIS — R198 Other specified symptoms and signs involving the digestive system and abdomen: Secondary | ICD-10-CM

## 2018-05-09 LAB — COMPREHENSIVE METABOLIC PANEL
ALT: 7 U/L (ref 0–35)
AST: 7 U/L (ref 0–37)
Albumin: 3.1 g/dL — ABNORMAL LOW (ref 3.5–5.2)
Alkaline Phosphatase: 77 U/L (ref 39–117)
BUN: 12 mg/dL (ref 6–23)
CO2: 24 mEq/L (ref 19–32)
Calcium: 8.5 mg/dL (ref 8.4–10.5)
Chloride: 105 mEq/L (ref 96–112)
Creatinine, Ser: 1.13 mg/dL (ref 0.40–1.20)
GFR: 46.97 mL/min — ABNORMAL LOW (ref >60.00)
Glucose, Bld: 109 mg/dL — ABNORMAL HIGH (ref 70–99)
Potassium: 3.9 mEq/L (ref 3.5–5.1)
SODIUM: 138 meq/L (ref 135–145)
Total Bilirubin: 0.2 mg/dL (ref 0.2–1.2)
Total Protein: 6 g/dL (ref 6.0–8.3)

## 2018-05-09 LAB — CBC
HCT: 31.1 % — ABNORMAL LOW (ref 36.0–46.0)
Hemoglobin: 9.9 g/dL — ABNORMAL LOW (ref 12.0–15.0)
MCHC: 31.9 g/dL (ref 30.0–36.0)
MCV: 81.6 fl (ref 78.0–100.0)
Platelets: 381 10*3/uL (ref 150.0–400.0)
RBC: 3.81 Mil/uL — ABNORMAL LOW (ref 3.87–5.11)
RDW: 16.4 % — ABNORMAL HIGH (ref 11.5–15.5)
WBC: 13 10*3/uL — ABNORMAL HIGH (ref 4.0–10.5)

## 2018-05-09 NOTE — Patient Instructions (Signed)
Your provider has requested that you go to the basement level for lab work before leaving today. Press "B" on the elevator. The lab is located at the first door on the left as you exit the elevator. _______________________________________________________________ Please drink 6-8 glasses water day prior to your CT scan. _______________________________________________________________  You have been scheduled for a CT scan of the abdomen and pelvis at [place] ([address]---this is in the same building as Press photographer).   You are scheduled on 05/10/2018 at 9:00 am at [facility]. You should arrive 15 minutes prior to your appointment time for registration. Please follow the written instructions below on the day of your exam:  WARNING: IF YOU ARE ALLERGIC TO IODINE/X-RAY DYE, PLEASE NOTIFY RADIOLOGY IMMEDIATELY AT [phone]! YOU WILL BE GIVEN A 13 HOUR PREMEDICATION PREP.  1) Do not eat or drink anything after 5:00 am (4 hours prior to your test) 2) You have been given 2 bottles of oral contrast to drink. The solution may taste better if refrigerated, but do NOT add ice or any other liquid to this solution. Shake well before drinking.    Drink 1 bottle of contrast @ 7:00 am (2 hours prior to your exam)  Drink 1 bottle of contrast @ 8:00 am (1 hour prior to your exam)  You may take any medications as prescribed with a small amount of water, if necessary. If you take any of the following medications: METFORMIN, GLUCOPHAGE, GLUCOVANCE, AVANDAMET, RIOMET, FORTAMET, [place] MET, JANUMET, GLUMETZA or METAGLIP, please HOLD this medication day of exam until 48 hours AFTER the exam.  The purpose of you drinking the oral contrast is to aid in the visualization of your intestinal tract. The contrast solution may cause some diarrhea. Depending on your individual set of symptoms, you may also receive an intravenous injection of x-ray contrast/dye. Plan on being at [facility] for 30 minutes or longer, depending on the type of exam you are having performed.  This test typically takes 30-45 minutes to complete.  If you have any questions regarding your exam or if you need to reschedule, you may call the CT department at [phone] between the hours of 8:00 am and 5:00 pm, Monday-Friday.  __________________________________________________________________  If you are age 61 or older, your body mass index should be between 23-30. Your Body mass index is 21.37 kg/m. If this is out of the aforementioned range listed, please consider follow up with your Primary Care Provider.  If you are age 47 or younger, your body mass index should be between 19-25. Your Body mass index is 21.37 kg/m. If this is out of the aformentioned range listed, please consider follow up with your Primary Care Provider.

## 2018-05-09 NOTE — H&P (View-Only) (Signed)
Chief Complaint:  Nausea and diarrhea     IMPRESSION and PLAN:     73 yo female with worsening nausea and diarrhea Stools are loose but no more than 2-3 times a day. -With recent hospitalization need to rule out C diff. Hopefully she can submit a specimen today  Nausea and abnormal abdominal exam. There is a mass involving mid upper abdomen / LUQ. Suspect this is the etiology of her nausea.  -obtain BMET to check renal function -CTAP with contrast ASAP.  -will call her with results / next step   Post-op anemia. Following hip surgery the end of January her hgb declined from 12.5 to 8.9.                    No overt GI bleeding.  -repeat CBC today ( last one was a month ago)  History of adenomatous colon polyps. Multiple adenomas including one 20 mm in size was removed at time of last colonoscopy in  Feb 2016 [facility]). She was unaware of need for follow up colonoscopy.  -Hold off on scheduling colonoscopy until results of CTAP received.Marland Kitchen   HPI:     Patient is a 73 year old female with a remote history of COPD, DM2, lung cancer, breast cancer and RCC. She was previously followed by Dr. Olevia Perches. She has a history of gastroparesis and colon polyps. She was admitted to [facility] in Feb 2016 for melena and anemia. Records reviewed in Care EveryWhere. She underwent EGD and colonoscopy by Dr. Arther Dames . EGD revealing of only gastritis but colonoscopy findings pertinent for multiple polyps including one that was 20 mm . Path: adenomas without HGD. She followed up outpatient with Dr. Rayann Heman. He started her on iron without significant improvement in hgb so she was scheduled for video capsule study but I can't locate results. Her hgb eventually recovered, it was in mid 12 range in mid Nov 2018.   04/06/18 - admitted with hip fracture after a fall. She underwent left hip repair. Pre-op hgb was 11.1 (down 1.5 grams from Nov 2018). Post operatively hgb declined to and stabilized around 8.9.   Patient's  husband is with her today. He made this appointment to evaluation of nausea, diarrhea and anemia. The nausea and diarrhea both seemed to start months ago (she thinks). Initially symptoms were intermittent but over the last month the nausea has bothered her on a daily basis. She has no associated abdominal pain. The diarrhea has also gotten worse over the last month. She is averaging 2-3 loose loose, non-bloody BMs a day. She can't relate either symptoms to any medication changes. She does recall receiving antibiotics while hospitalized with hip injury. No associated weight loss. For the nausea she had an old Rx for Reglan which didn't help and caused more diarrhea.. She has been taking Pepto   Review of systems:     All systems reviewed and negative except where noted in HPI.   Past Medical History:  Diagnosis Date  . Anxiety   . Asthma   . Breast cancer [facility])    h/o- radiation  . Carcinoid tumor of lung 1991  . COPD (chronic obstructive pulmonary disease) ([place])   . Depression   . Diabetes mellitus type II   . Diverticulosis   . Dyspnea    with exertion   . Gastroparesis   . GERD (gastroesophageal reflux disease)   . History of adenomatous polyp of colon   . History of breast  cancer   . Hyperlipidemia   . Mild cognitive impairment   . Osteoarthritis   . Pneumonia   . Pulmonary nodule    multiple  . Renal cell carcinoma     Patient's surgical history, family medical history, social history, medications and allergies were all reviewed in Epic   Creatinine clearance cannot be calculated (Patient's most recent lab result is older than the maximum 21 days allowed.)  Current Outpatient Medications  Medication Sig Dispense Refill  . albuterol (PROVENTIL HFA;VENTOLIN HFA) 108 (90 Base) MCG/ACT inhaler Inhale 2 puffs into the lungs every 6 (six) hours as needed.    Marland Kitchen albuterol (PROVENTIL) (2.5 MG/3ML) 0.083% nebulizer solution Take 3 mLs (2.5 mg total) by nebulization every 6 (six) hours  as needed for wheezing or shortness of breath. 150 mL 1  . bismuth subsalicylate (PEPTO BISMOL) 262 MG/15ML suspension Take 30 mLs by mouth every 6 (six) hours as needed for indigestion.    . cholecalciferol (VITAMIN D3) 25 MCG (1000 UT) tablet Take 1,000 Units by mouth daily.    . fexofenadine (ALLEGRA) 180 MG tablet Take 180 mg by mouth daily as needed (nasal drainage).    . fluticasone (FLONASE) 50 MCG/ACT nasal spray USE 2 SPRAYS NASALLY DAILY (Patient taking differently: Place 2 sprays into both nostrils daily. ) 48 g 3  . metFORMIN (GLUCOPHAGE) 500 MG tablet TAKE 1 TABLET TWICE A DAY  WITH MEALS 180 tablet 0  . mometasone-formoterol (DULERA) 200-5 MCG/ACT AERO Take 2 puffs first thing in am and then another 2 puffs about 12 hours later. 3 Inhaler 3  . Multiple Vitamin (MULTIVITAMIN) tablet Juice plus       Take one  By mouth daily    . Omega-3 Fatty Acids (FISH OIL) 1200 MG CAPS Take 1,200 mg by mouth daily.     . pravastatin (PRAVACHOL) 40 MG tablet TAKE 1 TABLET DAILY 90 tablet 0  . pregabalin (LYRICA) 75 MG capsule Take 1 capsule (75 mg total) by mouth 3 (three) times daily. (Patient taking differently: Take 75 mg by mouth 2 (two) times daily. ) 270 capsule 3  . tiotropium (SPIRIVA HANDIHALER) 18 MCG inhalation capsule INHALE THE CONTENTS OF 1   CAPSULE VIA HANDIHALER     DAILY 90 capsule 3   No current facility-administered medications for this visit.     Physical Exam:     BP 122/74   Pulse 71   Ht 5\' 4"  (1.626 m)   Wt 124 lb 8 oz (56.5 kg)   SpO2 97%   BMI 21.37 kg/m   GENERAL:  Pleasant female in NAD PSYCH: : Cooperative, normal affect EENT:  conjunctiva pink, mucous membranes moist, neck supple without masses CARDIAC:  RRR,  no peripheral edema PULM: Normal respiratory effort, lungs CTA bilaterally, no wheezing ABDOMEN:  Nondistended, soft. In mid upper abdomen extending into LUQ there is a somewhat tender palpable mass. SKIN:  turgor, no lesions seen Musculoskeletal:   Normal muscle tone, normal strength NEURO: Alert and oriented x 3, no focal neurologic deficits   Tye Savoy , NP 05/09/2018, 2:26 PM

## 2018-05-09 NOTE — Progress Notes (Signed)
Chief Complaint:  Nausea and diarrhea     IMPRESSION and PLAN:     73 yo female with worsening nausea and diarrhea Stools are loose but no more than 2-3 times a day. -With recent hospitalization need to rule out C diff. Hopefully she can submit a specimen today  Nausea and abnormal abdominal exam. There is a mass involving mid upper abdomen / LUQ. Suspect this is the etiology of her nausea.  -obtain BMET to check renal function -CTAP with contrast ASAP.  -will call her with results / next step   Post-op anemia. Following hip surgery the end of January her hgb declined from 12.5 to 8.9.                    No overt GI bleeding.  -repeat CBC today ( last one was a month ago)  History of adenomatous colon polyps. Multiple adenomas including one 20 mm in size was removed at time of last colonoscopy in  Feb 2016 [facility]). She was unaware of need for follow up colonoscopy.  -Hold off on scheduling colonoscopy until results of CTAP received.Marland Kitchen   HPI:     Patient is a 73 year old female with a remote history of COPD, DM2, lung cancer, breast cancer and RCC. She was previously followed by Dr. Olevia Perches. She has a history of gastroparesis and colon polyps. She was admitted to [facility] in Feb 2016 for melena and anemia. Records reviewed in Care EveryWhere. She underwent EGD and colonoscopy by Dr. Arther Dames . EGD revealing of only gastritis but colonoscopy findings pertinent for multiple polyps including one that was 20 mm . Path: adenomas without HGD. She followed up outpatient with Dr. Rayann Heman. He started her on iron without significant improvement in hgb so she was scheduled for video capsule study but I can't locate results. Her hgb eventually recovered, it was in mid 12 range in mid Nov 2018.   04/06/18 - admitted with hip fracture after a fall. She underwent left hip repair. Pre-op hgb was 11.1 (down 1.5 grams from Nov 2018). Post operatively hgb declined to and stabilized around 8.9.   Patient's  husband is with her today. He made this appointment to evaluation of nausea, diarrhea and anemia. The nausea and diarrhea both seemed to start months ago (she thinks). Initially symptoms were intermittent but over the last month the nausea has bothered her on a daily basis. She has no associated abdominal pain. The diarrhea has also gotten worse over the last month. She is averaging 2-3 loose loose, non-bloody BMs a day. She can't relate either symptoms to any medication changes. She does recall receiving antibiotics while hospitalized with hip injury. No associated weight loss. For the nausea she had an old Rx for Reglan which didn't help and caused more diarrhea.. She has been taking Pepto   Review of systems:     All systems reviewed and negative except where noted in HPI.   Past Medical History:  Diagnosis Date  . Anxiety   . Asthma   . Breast cancer [facility])    h/o- radiation  . Carcinoid tumor of lung 1991  . COPD (chronic obstructive pulmonary disease) ([place])   . Depression   . Diabetes mellitus type II   . Diverticulosis   . Dyspnea    with exertion   . Gastroparesis   . GERD (gastroesophageal reflux disease)   . History of adenomatous polyp of colon   . History of breast  cancer   . Hyperlipidemia   . Mild cognitive impairment   . Osteoarthritis   . Pneumonia   . Pulmonary nodule    multiple  . Renal cell carcinoma     Patient's surgical history, family medical history, social history, medications and allergies were all reviewed in Epic   Creatinine clearance cannot be calculated (Patient's most recent lab result is older than the maximum 21 days allowed.)  Current Outpatient Medications  Medication Sig Dispense Refill  . albuterol (PROVENTIL HFA;VENTOLIN HFA) 108 (90 Base) MCG/ACT inhaler Inhale 2 puffs into the lungs every 6 (six) hours as needed.    Marland Kitchen albuterol (PROVENTIL) (2.5 MG/3ML) 0.083% nebulizer solution Take 3 mLs (2.5 mg total) by nebulization every 6 (six) hours  as needed for wheezing or shortness of breath. 150 mL 1  . bismuth subsalicylate (PEPTO BISMOL) 262 MG/15ML suspension Take 30 mLs by mouth every 6 (six) hours as needed for indigestion.    . cholecalciferol (VITAMIN D3) 25 MCG (1000 UT) tablet Take 1,000 Units by mouth daily.    . fexofenadine (ALLEGRA) 180 MG tablet Take 180 mg by mouth daily as needed (nasal drainage).    . fluticasone (FLONASE) 50 MCG/ACT nasal spray USE 2 SPRAYS NASALLY DAILY (Patient taking differently: Place 2 sprays into both nostrils daily. ) 48 g 3  . metFORMIN (GLUCOPHAGE) 500 MG tablet TAKE 1 TABLET TWICE A DAY  WITH MEALS 180 tablet 0  . mometasone-formoterol (DULERA) 200-5 MCG/ACT AERO Take 2 puffs first thing in am and then another 2 puffs about 12 hours later. 3 Inhaler 3  . Multiple Vitamin (MULTIVITAMIN) tablet Juice plus       Take one  By mouth daily    . Omega-3 Fatty Acids (FISH OIL) 1200 MG CAPS Take 1,200 mg by mouth daily.     . pravastatin (PRAVACHOL) 40 MG tablet TAKE 1 TABLET DAILY 90 tablet 0  . pregabalin (LYRICA) 75 MG capsule Take 1 capsule (75 mg total) by mouth 3 (three) times daily. (Patient taking differently: Take 75 mg by mouth 2 (two) times daily. ) 270 capsule 3  . tiotropium (SPIRIVA HANDIHALER) 18 MCG inhalation capsule INHALE THE CONTENTS OF 1   CAPSULE VIA HANDIHALER     DAILY 90 capsule 3   No current facility-administered medications for this visit.     Physical Exam:     BP 122/74   Pulse 71   Ht 5\' 4"  (1.626 m)   Wt 124 lb 8 oz (56.5 kg)   SpO2 97%   BMI 21.37 kg/m   GENERAL:  Pleasant female in NAD PSYCH: : Cooperative, normal affect EENT:  conjunctiva pink, mucous membranes moist, neck supple without masses CARDIAC:  RRR,  no peripheral edema PULM: Normal respiratory effort, lungs CTA bilaterally, no wheezing ABDOMEN:  Nondistended, soft. In mid upper abdomen extending into LUQ there is a somewhat tender palpable mass. SKIN:  turgor, no lesions seen Musculoskeletal:   Normal muscle tone, normal strength NEURO: Alert and oriented x 3, no focal neurologic deficits   Tye Savoy , NP 05/09/2018, 2:26 PM

## 2018-05-10 ENCOUNTER — Ambulatory Visit (HOSPITAL_COMMUNITY)
Admission: RE | Admit: 2018-05-10 | Discharge: 2018-05-10 | Disposition: A | Discharge status: Home / Self Care | Payer: Medicare Other | Source: Ambulatory Visit | Attending: Nurse Practitioner | Admitting: Nurse Practitioner

## 2018-05-10 DIAGNOSIS — R110 Nausea: Secondary | ICD-10-CM | POA: Diagnosis not present

## 2018-05-10 DIAGNOSIS — C787 Secondary malignant neoplasm of liver and intrahepatic bile duct: Secondary | ICD-10-CM | POA: Diagnosis not present

## 2018-05-10 DIAGNOSIS — R198 Other specified symptoms and signs involving the digestive system and abdomen: Secondary | ICD-10-CM | POA: Diagnosis not present

## 2018-05-10 MED ORDER — IOHEXOL 300 MG/ML  SOLN
100.0000 mL | Freq: Once | INTRAMUSCULAR | Status: AC | PRN
  Administered 2018-05-10: 80 mL via INTRAVENOUS

## 2018-05-10 NOTE — Progress Notes (Signed)
____________________________________________________________  Attending physician addendum:  Thank you for sending this case to me, and for having me see her in clinic with you. I have reviewed the entire note, and the outlined plan is what we discussed.  Very worrisome presentation.  I have just reviewed the CT scan, which shows a large metastatic lesion in the left lobe of liver (corresponding to the area of fullness on exam), as well as a large cecal abnormality.  There are lung nodules largely stable from prior scan.  Possible pancreatic abnormality of unclear significance.  She needs a colonoscopy next week.  Please convey results to her and her husband today and make arrangements for colonoscopy with me next week.  I have openings in my schedule.  Wilfrid Lund, MD  ____________________________________________________________

## 2018-05-11 ENCOUNTER — Other Ambulatory Visit: Payer: Self-pay

## 2018-05-11 ENCOUNTER — Ambulatory Visit (AMBULATORY_SURGERY_CENTER): Payer: Self-pay | Admitting: *Deleted

## 2018-05-11 VITALS — Ht 64.0 in | Wt 124.0 lb

## 2018-05-11 DIAGNOSIS — R160 Hepatomegaly, not elsewhere classified: Secondary | ICD-10-CM

## 2018-05-11 DIAGNOSIS — Z86010 Personal history of colonic polyps: Secondary | ICD-10-CM

## 2018-05-11 DIAGNOSIS — Z9981 Dependence on supplemental oxygen: Secondary | ICD-10-CM

## 2018-05-11 DIAGNOSIS — K6389 Other specified diseases of intestine: Secondary | ICD-10-CM

## 2018-05-11 DIAGNOSIS — R9389 Abnormal findings on diagnostic imaging of other specified body structures: Secondary | ICD-10-CM

## 2018-05-11 MED ORDER — PEG-KCL-NACL-NASULF-NA ASC-C 140 G PO SOLR: 1.0000 | ORAL | 0 refills | Status: DC

## 2018-05-11 NOTE — Progress Notes (Addendum)
No egg or soy allergy known to patient  No issues with past sedation with any surgeries  or procedures, no intubation problems  No diet pills per patient No home 02 use per patient  No blood thinners per patient  Pt denies issues with constipation  No A fib or A flutter  EMMI video sent to pt's e mail  Plenvu sample lot [id number] exp 09-2018   In [place] pt states she has oxygen use at home 2 liters at bedtime- uses this every night since the beginning of February 2020-  Dr Loletha Carrow off- Spoke with Dr Hilarie Fredrickson and he called Dr Loletha Carrow- Procedure must be moved to hospital- pt and husband aware - Dr Rush Landmark will do procedure 3-10 Tuesday at 730 am arrive at 6   Dr Hilarie Fredrickson spoke to pt and husband, at husbands request about CT results in [place] today

## 2018-05-15 ENCOUNTER — Encounter: Payer: Medicare Other | Admitting: Gastroenterology

## 2018-05-16 ENCOUNTER — Ambulatory Visit (HOSPITAL_COMMUNITY)
Admission: RE | Admit: 2018-05-16 | Discharge: 2018-05-16 | Disposition: A | Discharge status: Home / Self Care | Payer: Medicare Other | Attending: Gastroenterology | Admitting: Gastroenterology

## 2018-05-16 ENCOUNTER — Ambulatory Visit (HOSPITAL_COMMUNITY): Payer: Medicare Other | Admitting: Anesthesiology

## 2018-05-16 ENCOUNTER — Other Ambulatory Visit: Payer: Self-pay

## 2018-05-16 ENCOUNTER — Encounter (HOSPITAL_COMMUNITY): Payer: Self-pay | Admitting: *Deleted

## 2018-05-16 ENCOUNTER — Encounter (HOSPITAL_COMMUNITY)
Admission: RE | Disposition: A | Discharge status: Home / Self Care | Payer: Self-pay | Source: Home / Self Care | Attending: Gastroenterology

## 2018-05-16 ENCOUNTER — Telehealth: Payer: Self-pay | Admitting: Gastroenterology

## 2018-05-16 DIAGNOSIS — J449 Chronic obstructive pulmonary disease, unspecified: Secondary | ICD-10-CM | POA: Insufficient documentation

## 2018-05-16 DIAGNOSIS — Z87891 Personal history of nicotine dependence: Secondary | ICD-10-CM | POA: Insufficient documentation

## 2018-05-16 DIAGNOSIS — F419 Anxiety disorder, unspecified: Secondary | ICD-10-CM | POA: Diagnosis not present

## 2018-05-16 DIAGNOSIS — D649 Anemia, unspecified: Secondary | ICD-10-CM | POA: Diagnosis not present

## 2018-05-16 DIAGNOSIS — K219 Gastro-esophageal reflux disease without esophagitis: Secondary | ICD-10-CM | POA: Diagnosis not present

## 2018-05-16 DIAGNOSIS — D123 Benign neoplasm of transverse colon: Secondary | ICD-10-CM

## 2018-05-16 DIAGNOSIS — Z7984 Long term (current) use of oral hypoglycemic drugs: Secondary | ICD-10-CM | POA: Insufficient documentation

## 2018-05-16 DIAGNOSIS — Z853 Personal history of malignant neoplasm of breast: Secondary | ICD-10-CM | POA: Insufficient documentation

## 2018-05-16 DIAGNOSIS — D124 Benign neoplasm of descending colon: Secondary | ICD-10-CM | POA: Diagnosis not present

## 2018-05-16 DIAGNOSIS — Z79899 Other long term (current) drug therapy: Secondary | ICD-10-CM | POA: Insufficient documentation

## 2018-05-16 DIAGNOSIS — Z85118 Personal history of other malignant neoplasm of bronchus and lung: Secondary | ICD-10-CM | POA: Insufficient documentation

## 2018-05-16 DIAGNOSIS — M1990 Unspecified osteoarthritis, unspecified site: Secondary | ICD-10-CM | POA: Insufficient documentation

## 2018-05-16 DIAGNOSIS — K5730 Diverticulosis of large intestine without perforation or abscess without bleeding: Secondary | ICD-10-CM

## 2018-05-16 DIAGNOSIS — Z86010 Personal history of colonic polyps: Secondary | ICD-10-CM | POA: Insufficient documentation

## 2018-05-16 DIAGNOSIS — D127 Benign neoplasm of rectosigmoid junction: Secondary | ICD-10-CM

## 2018-05-16 DIAGNOSIS — K6389 Other specified diseases of intestine: Secondary | ICD-10-CM

## 2018-05-16 DIAGNOSIS — K3184 Gastroparesis: Secondary | ICD-10-CM | POA: Diagnosis not present

## 2018-05-16 DIAGNOSIS — K644 Residual hemorrhoidal skin tags: Secondary | ICD-10-CM | POA: Diagnosis not present

## 2018-05-16 DIAGNOSIS — Z85528 Personal history of other malignant neoplasm of kidney: Secondary | ICD-10-CM | POA: Insufficient documentation

## 2018-05-16 DIAGNOSIS — K641 Second degree hemorrhoids: Secondary | ICD-10-CM | POA: Insufficient documentation

## 2018-05-16 DIAGNOSIS — E1143 Type 2 diabetes mellitus with diabetic autonomic (poly)neuropathy: Secondary | ICD-10-CM | POA: Insufficient documentation

## 2018-05-16 DIAGNOSIS — D175 Benign lipomatous neoplasm of intra-abdominal organs: Secondary | ICD-10-CM | POA: Diagnosis not present

## 2018-05-16 DIAGNOSIS — F329 Major depressive disorder, single episode, unspecified: Secondary | ICD-10-CM | POA: Insufficient documentation

## 2018-05-16 DIAGNOSIS — D120 Benign neoplasm of cecum: Secondary | ICD-10-CM | POA: Diagnosis not present

## 2018-05-16 DIAGNOSIS — R933 Abnormal findings on diagnostic imaging of other parts of digestive tract: Secondary | ICD-10-CM | POA: Diagnosis present

## 2018-05-16 DIAGNOSIS — R160 Hepatomegaly, not elsewhere classified: Secondary | ICD-10-CM

## 2018-05-16 HISTORY — PX: BIOPSY: SHX5522

## 2018-05-16 HISTORY — PX: HEMOSTASIS CLIP PLACEMENT: SHX6857

## 2018-05-16 HISTORY — PX: COLONOSCOPY WITH PROPOFOL: SHX5780

## 2018-05-16 HISTORY — PX: POLYPECTOMY: SHX5525

## 2018-05-16 LAB — GLUCOSE, CAPILLARY: Glucose-Capillary: 157 mg/dL — ABNORMAL HIGH (ref 70–99)

## 2018-05-16 SURGERY — COLONOSCOPY WITH PROPOFOL
Anesthesia: Monitor Anesthesia Care

## 2018-05-16 MED ORDER — PROPOFOL 500 MG/50ML IV EMUL
INTRAVENOUS | Status: DC | PRN
  Administered 2018-05-16: 100 ug/kg/min via INTRAVENOUS

## 2018-05-16 MED ORDER — PROPOFOL 10 MG/ML IV BOLUS
INTRAVENOUS | Status: AC
  Filled 2018-05-16: qty 20

## 2018-05-16 MED ORDER — SODIUM CHLORIDE 0.9 % IV SOLN: INTRAVENOUS | Status: DC

## 2018-05-16 MED ORDER — PROPOFOL 10 MG/ML IV BOLUS
INTRAVENOUS | Status: AC
  Filled 2018-05-16: qty 60

## 2018-05-16 MED ORDER — LACTATED RINGERS IV SOLN
INTRAVENOUS | Status: DC | PRN
  Administered 2018-05-16: 07:00:00 via INTRAVENOUS

## 2018-05-16 MED ORDER — ESMOLOL HCL 100 MG/10ML IV SOLN
INTRAVENOUS | Status: DC | PRN
  Administered 2018-05-16: 10 mg via INTRAVENOUS

## 2018-05-16 MED ORDER — PROPOFOL 10 MG/ML IV BOLUS
INTRAVENOUS | Status: DC | PRN
  Administered 2018-05-16: 20 mg via INTRAVENOUS
  Administered 2018-05-16: 10 mg via INTRAVENOUS
  Administered 2018-05-16: 40 mg via INTRAVENOUS

## 2018-05-16 SURGICAL SUPPLY — 22 items

## 2018-05-16 NOTE — Discharge Instructions (Signed)
YOU HAD AN ENDOSCOPIC PROCEDURE TODAY: Refer to the procedure report and other information in the discharge instructions given to you for any specific questions about what was found during the examination. If this information does not answer your questions, please call [place] office at [phone] to clarify.  ° °YOU SHOULD EXPECT: Some feelings of bloating in the abdomen. Passage of more gas than usual. Walking can help get rid of the air that was put into your GI tract during the procedure and reduce the bloating. If you had a lower endoscopy (such as a colonoscopy or flexible sigmoidoscopy) you may notice spotting of blood in your stool or on the toilet paper. Some abdominal soreness may be present for a day or two, also. ° °DIET: Your first meal following the procedure should be a light meal and then it is ok to progress to your normal diet. A half-sandwich or bowl of soup is an example of a good first meal. Heavy or fried foods are harder to digest and may make you feel nauseous or bloated. Drink plenty of fluids but you should avoid alcoholic beverages for 24 hours. If you had a esophageal dilation, please see attached instructions for diet.   ° °ACTIVITY: Your care partner should take you home directly after the procedure. You should plan to take it easy, moving slowly for the rest of the day. You can resume normal activity the day after the procedure however YOU SHOULD NOT DRIVE, use power tools, machinery or perform tasks that involve climbing or major physical exertion for 24 hours (because of the sedation medicines used during the test).  ° °SYMPTOMS TO REPORT IMMEDIATELY: °A gastroenterologist can be reached at any hour. Please call [phone]  for any of the following symptoms:  °Following lower endoscopy (colonoscopy, flexible sigmoidoscopy) °Excessive amounts of blood in the stool  °Significant tenderness, worsening of abdominal pains  °Swelling of the abdomen that is new, acute  °Fever of 100° or  higher  °Following upper endoscopy (EGD, EUS, ERCP, esophageal dilation) °Vomiting of blood or coffee ground material  °New, significant abdominal pain  °New, significant chest pain or pain under the shoulder blades  °Painful or persistently difficult swallowing  °New shortness of breath  °Black, tarry-looking or red, bloody stools ° °FOLLOW UP:  °If any biopsies were taken you will be contacted by phone or by letter within the next 1-3 weeks. Call [phone]  if you have not heard about the biopsies in 3 weeks.  °Please also call with any specific questions about appointments or follow up tests. ° °

## 2018-05-16 NOTE — Anesthesia Preprocedure Evaluation (Signed)
Anesthesia Evaluation  Patient identified by MRN, date of birth, ID band Patient awake    Reviewed: Allergy & Precautions, NPO status , Patient's Chart, lab work & pertinent test results  Airway Mallampati: II  TM Distance: >3 FB Neck ROM: Full    Dental no notable dental hx.    Pulmonary COPD,  COPD inhaler, former smoker,    Pulmonary exam normal breath sounds clear to auscultation (-) decreased breath sounds      Cardiovascular negative cardio ROS Normal cardiovascular exam Rhythm:Regular Rate:Normal     Neuro/Psych negative neurological ROS  negative psych ROS   GI/Hepatic Neg liver ROS, GERD  ,  Endo/Other  diabetes  Renal/GU negative Renal ROS  negative genitourinary   Musculoskeletal negative musculoskeletal ROS (+)   Abdominal   Peds negative pediatric ROS (+)  Hematology  (+) anemia ,   Anesthesia Other Findings   Reproductive/Obstetrics negative OB ROS                             Anesthesia Physical Anesthesia Plan  ASA: III  Anesthesia Plan: MAC   Post-op Pain Management:    Induction: Intravenous  PONV Risk Score and Plan: 0  Airway Management Planned: Simple Face Mask  Additional Equipment:   Intra-op Plan:   Post-operative Plan:   Informed Consent: I have reviewed the patients History and Physical, chart, labs and discussed the procedure including the risks, benefits and alternatives for the proposed anesthesia with the patient or authorized representative who has indicated his/her understanding and acceptance.     Dental advisory given  Plan Discussed with: CRNA and Surgeon  Anesthesia Plan Comments:         Anesthesia Quick Evaluation

## 2018-05-16 NOTE — Op Note (Signed)
[facility] Patient Name: [name] Procedure Date: 05/16/2018 MRN: [id number] Attending MD: Justice Britain , MD Date of Birth: 07/11/1945 CSN: [id number] Age: 73 Admit Type: Outpatient Procedure:                Colonoscopy Indications:              Abnormal CT of the GI tract Providers:                Justice Britain, MD, Carlyn Reichert, RN, CRNAChris                            Herbert Moors., Technician, , Heide Scales, CRNA Referring MD:             Estill Cotta. Loletha Carrow, MD, PA Karie Kirks Medicines:                Monitored Anesthesia Care Complications:            No immediate complications. Estimated Blood Loss:     Estimated blood loss was minimal. Procedure:                Pre-Anesthesia Assessment:                           - Prior to the procedure, a History and Physical                            was performed, and patient medications and                            allergies were reviewed. The patient's tolerance of                            previous anesthesia was also reviewed. The risks                            and benefits of the procedure and the sedation                            options and risks were discussed with the patient.                            All questions were answered, and informed consent                            was obtained. Prior Anticoagulants: The patient has                            taken no previous anticoagulant or antiplatelet                            agents. ASA Grade Assessment: III - A patient with                            severe systemic disease. After reviewing the risks  and benefits, the patient was deemed in                            satisfactory condition to undergo the procedure.                           After obtaining informed consent, the colonoscope                            was passed under direct vision. Throughout the   procedure, the patient's blood pressure, pulse, and                            oxygen saturations were monitored continuously. The                            PCF-H190DL ([id number]) Olympus pediatric colonscope                            was introduced through the anus and advanced to the                            5 cm into the ileum. The colonoscopy was                            technically difficult and complex due to                            significant looping and a tortuous colon.                            Successful completion of the procedure was aided by                            changing the patient's position, changing the                            patient to a supine position, using manual                            pressure, withdrawing and reinserting the scope,                            straightening and shortening the scope to obtain                            bowel loop reduction and using scope torsion. The                            patient tolerated the procedure. The quality of the                            bowel preparation was good. The terminal ileum,  ileocecal valve, appendiceal orifice, and rectum                            were photographed. Scope In: 7:42:33 AM Scope Out: 8:37:59 AM Scope Withdrawal Time: 0 hours 38 minutes 26 seconds  Total Procedure Duration: 0 hours 55 minutes 26 seconds  Findings:      Hemorrhoids were found on perianal exam.      The terminal ileum appeared normal.      The IC Valve was floppy and lipomatous.      A localized area of granular mucosa was found on the ileocecal valve.       Biopsies were taken with a cold forceps for histology to rule out       adenoma.      A 15 mm polyp was found in the cecum. The polyp was semi-pedunculated.       The polyp was removed with a hot snare. Resection and retrieval were       complete.      A 4 mm polyp was found in the cecum. The polyp was sessile. The polyp        was removed with a cold snare. Resection and retrieval were complete.      A medium post polypectomy scar was found at the hepatic flexure. There       was residual polypoid tissue. The polypoid tissue was removed with a hot       snare. Resection and retrieval were complete. To prevent bleeding after       the polypectomy, two hemostatic clips were successfully placed (MR       conditional). There was no bleeding during, or at the end, of the       procedure.      Five sessile polyps were found in the recto-sigmoid colon (1),       descending colon (1) and transverse colon (3). The polyps were 3 to 8 mm       in size. These polyps were removed with a cold snare. Resection and       retrieval were complete.      Many small-mouthed diverticula were found in the recto-sigmoid colon and       sigmoid colon.      Non-bleeding non-thrombosed external and internal hemorrhoids were found       during retroflexion, during perianal exam and during digital exam. The       hemorrhoids were Grade II (internal hemorrhoids that prolapse but reduce       spontaneously). Impression:               - Hemorrhoids found on perianal exam.                           - The examined portion of the terminal ileum was                            normal.                           - The IC Valve was floppy and lipomatous.                           - Granularity on the ileocecal valve. Biopsied to  rule in/out adenoma.                           - One 15 mm polyp in the cecum, removed with a hot                            snare. Resected and retrieved.                           - One 4 mm polyp in the cecum, removed with a cold                            snare. Resected and retrieved.                           - Post-polypectomy scar at the hepatic flexure with                            evidence of polypoid tissue present. Resected.                            Clips (MR conditional)  were placed.                           - Five 3 to 8 mm polyps at the recto-sigmoid colon,                            in the descending colon and in the transverse                            colon, removed with a cold snare. Resected and                            retrieved.                           - Diverticulosis in the recto-sigmoid colon and in                            the sigmoid colon.                           - Non-bleeding non-thrombosed external and internal                            hemorrhoids. Moderate Sedation:      Not Applicable - Patient had care per Anesthesia. Recommendation:           - The patient will be observed post-procedure,                            until all discharge criteria are met.                           - Discharge patient to home.                           -  Patient has a contact number available for                            emergencies. The signs and symptoms of potential                            delayed complications were discussed with the                            patient. Return to normal activities tomorrow.                            Written discharge instructions were provided to the                            patient.                           - High fiber diet.                           - Continue present medications.                           - No aspirin, ibuprofen, naproxen, or other                            non-steroidal anti-inflammatory drugs for 1 week                            after polyp removal.                           - Await pathology results.                           - Repeat colonoscopy may be recommended. The                            colonoscopy date will be determined after pathology                            results from today's exam become available for                            review and overall clinical course.                           - If evidence of adenomatous tissue is found on the                             IC Valve, will consider role of earlier colonoscopy                            based on patient's clinical course with concern for  metastatic disease process.                           - Patient will need a Liver biopsy to further                            confirm the origin of the hepatic mass/likely                            malignancy. Query role of EUS in future dependent                            on findings of the Liver biopsy. Recommend                            considering a CT-Chest as well for further staging.                            Will discuss with primary GI further workup                            consideration.                           - The findings and recommendations were discussed                            with the patient.                           - The findings and recommendations were discussed                            with the patient's family. Procedure Code(s):        --- Professional ---                           [phone], Colonoscopy, flexible; with removal of                            tumor(s), polyp(s), or other lesion(s) by snare                            technique                           [id number], 54, Colonoscopy, flexible; with biopsy,                            single or multiple Diagnosis Code(s):        --- Professional ---                           K64.1, Second degree hemorrhoids  K63.89, Other specified diseases of intestine                           D12.0, Benign neoplasm of cecum                           Z98.890, Other specified postprocedural states                           D12.7, Benign neoplasm of rectosigmoid junction                           D12.4, Benign neoplasm of descending colon                           D12.3, Benign neoplasm of transverse colon (hepatic                            flexure or splenic flexure)                           K57.30, Diverticulosis  of large intestine without                            perforation or abscess without bleeding                           R93.3, Abnormal findings on diagnostic imaging of                            other parts of digestive tract CPT copyright 2018 [employer]. All rights reserved. The codes documented in this report are preliminary and upon coder review may  be revised to meet current compliance requirements. Justice Britain, MD 05/16/2018 8:58:07 AM Number of Addenda: 0

## 2018-05-16 NOTE — Transfer of Care (Signed)
Immediate Anesthesia Transfer of Care Note  Patient: [name]  Procedure(s) Performed: Procedure(s): COLONOSCOPY WITH PROPOFOL (N/A) POLYPECTOMY BIOPSY HEMOSTASIS CLIP PLACEMENT  Patient Location: PACU  Anesthesia Type:MAC  Level of Consciousness: Patient easily awoken, sedated, comfortable, cooperative, following commands, responds to stimulation.   Airway & Oxygen Therapy: Patient spontaneously breathing, ventilating well, oxygen via simple oxygen mask.  Post-op Assessment: Report given to PACU RN, vital signs reviewed and stable, moving all extremities.   Post vital signs: Reviewed and stable.  Complications: No apparent anesthesia complications  Last Vitals:  Vitals Value Taken Time  BP 123/60 05/16/2018  8:42 AM  Temp    Pulse 85 05/16/2018  8:43 AM  Resp 19 05/16/2018  8:43 AM  SpO2 100 % 05/16/2018  8:43 AM  Vitals shown include unvalidated device data.  Last Pain:  Vitals:   05/16/18 0655  TempSrc: Oral  PainSc: 0-No pain         Complications: No apparent anesthesia complications

## 2018-05-16 NOTE — Anesthesia Postprocedure Evaluation (Signed)
Anesthesia Post Note  Patient: [name]  Procedure(s) Performed: COLONOSCOPY WITH PROPOFOL (N/A ) POLYPECTOMY BIOPSY HEMOSTASIS CLIP PLACEMENT     Patient location during evaluation: PACU Anesthesia Type: MAC Level of consciousness: awake and alert Pain management: pain level controlled Vital Signs Assessment: post-procedure vital signs reviewed and stable Respiratory status: spontaneous breathing, nonlabored ventilation, respiratory function stable and patient connected to nasal cannula oxygen Cardiovascular status: stable and blood pressure returned to baseline Postop Assessment: no apparent nausea or vomiting Anesthetic complications: no    Last Vitals:  Vitals:   05/16/18 0655 05/16/18 0845  BP: 127/63 123/60  Pulse:  84  Resp: 20 18  Temp: 36.5 C 36.9 C  SpO2: 97% 100%    Last Pain:  Vitals:   05/16/18 0907  TempSrc:   PainSc: 0-No pain                 Saleah Rishel S

## 2018-05-16 NOTE — Anesthesia Procedure Notes (Signed)
Procedure Name: MAC Date/Time: 05/16/2018 7:37 AM Performed by: Deliah Boston, CRNA Pre-anesthesia Checklist: Patient identified, Emergency Drugs available, Suction available and Patient being monitored Patient Re-evaluated:Patient Re-evaluated prior to induction Oxygen Delivery Method: Simple face mask Induction Type: IV induction Placement Confirmation: positive ETCO2 and breath sounds checked- equal and bilateral

## 2018-05-16 NOTE — Telephone Encounter (Signed)
[name],  Dr. Rush Landmark did a colonoscopy on this patient 3/10. He spoke to the patient's husband and daughter afterwards about the need for further staging and a biopsy of the liver mass.  Please arrange the following:  PT/INR  CT scan chest with IV contrast (recent creatinine normal).  Indication: liver mass, suspected metastasis.  CT or US-guided biopsy of left lobe liver mass with interventional radiology. ( Perhaps the chest scan and the liver biopsy can be done during the same trip to radiology.  She is an elderly lady with dementia and I think it is a challenge for her family to get her to appointments.)  Please communicate with Siennah's husband regarding the arrangements.

## 2018-05-16 NOTE — Interval H&P Note (Signed)
History and Physical Interval Note:  05/16/2018 7:25 AM  [name]  has presented today for surgery, with the diagnosis of liver mass possible cecal mass.  The various methods of treatment have been discussed with the patient and family. After consideration of risks, benefits and other options for treatment, the patient has consented to  Procedure(s): COLONOSCOPY WITH PROPOFOL (N/A) as a surgical intervention.  The patient's history has been reviewed, patient examined, no change in status, stable for surgery.  I have reviewed the patient's chart and labs.  Questions were answered to the patient's satisfaction.     [employer]

## 2018-05-17 ENCOUNTER — Telehealth: Payer: Self-pay | Admitting: Internal Medicine

## 2018-05-17 ENCOUNTER — Other Ambulatory Visit: Payer: Self-pay

## 2018-05-17 DIAGNOSIS — R160 Hepatomegaly, not elsewhere classified: Secondary | ICD-10-CM

## 2018-05-17 NOTE — Telephone Encounter (Signed)
R/S to 6/10 spouse aware

## 2018-05-17 NOTE — Telephone Encounter (Signed)
I left a message on patient's cell phone voice mail to return my call.  Dr.Letvak requested patient reschedule her cpx appointment for this Friday.  He said she can reschedule it to 2-3 months.

## 2018-05-17 NOTE — Telephone Encounter (Signed)
Pt scheduled for CT of Chest at [facility] 05/23/18@8am , pt to be NPO after midnight. Pt to arrive there at 7:45am. Pt knows to come for lab prior to CT. Order placed for US liver biopsy, radiology to review and contact pt with the appointment. Pt aware of appt.

## 2018-05-18 ENCOUNTER — Encounter (INDEPENDENT_AMBULATORY_CARE_PROVIDER_SITE_OTHER): Payer: Self-pay | Admitting: Physician Assistant

## 2018-05-18 ENCOUNTER — Other Ambulatory Visit (INDEPENDENT_AMBULATORY_CARE_PROVIDER_SITE_OTHER): Payer: Medicare Other

## 2018-05-18 ENCOUNTER — Ambulatory Visit (INDEPENDENT_AMBULATORY_CARE_PROVIDER_SITE_OTHER): Payer: Self-pay

## 2018-05-18 ENCOUNTER — Ambulatory Visit (INDEPENDENT_AMBULATORY_CARE_PROVIDER_SITE_OTHER): Payer: Medicare Other | Admitting: Physician Assistant

## 2018-05-18 ENCOUNTER — Other Ambulatory Visit: Payer: Self-pay

## 2018-05-18 DIAGNOSIS — R160 Hepatomegaly, not elsewhere classified: Secondary | ICD-10-CM

## 2018-05-18 DIAGNOSIS — S72002D Fracture of unspecified part of neck of left femur, subsequent encounter for closed fracture with routine healing: Secondary | ICD-10-CM

## 2018-05-18 DIAGNOSIS — S72002A Fracture of unspecified part of neck of left femur, initial encounter for closed fracture: Secondary | ICD-10-CM

## 2018-05-18 DIAGNOSIS — R600 Localized edema: Secondary | ICD-10-CM

## 2018-05-18 LAB — PROTIME-INR
INR: 1 ratio (ref 0.8–1.0)
PROTHROMBIN TIME: 11.8 s (ref 9.6–13.1)

## 2018-05-18 NOTE — Progress Notes (Signed)
HPI: [name] is now 6 weeks status post cannulated pinning of left femoral neck fracture. She is over all doing well. Denies chest pain, SOB or any wound problems. Only complaint is swelling in both legs.  Denies any chest pain, shortness of breath, calf pain fevers or chills.  She reports she is ambulating well with a cane.   Physical exam: Left hip good range of motion without pain..  She has edema of both legs nonpitting.  Calves are supple nontender.  Radiographs: AP pelvis and lateral view of the left hip show patient be status post cannulated pinning left hip fracture.  There is signs of consolidation.  No hardware failure no other fractures identified.  Impression: 6-week status post left femoral neck fracture pinning  Plan: She is given a prescription for compression hose which she will wear during the day.  She is activities as tolerated.  Follow-up with [place] on as-needed basis.

## 2018-05-19 ENCOUNTER — Encounter: Payer: Medicare Other | Admitting: Internal Medicine

## 2018-05-21 ENCOUNTER — Encounter: Payer: Self-pay | Admitting: Gastroenterology

## 2018-05-23 ENCOUNTER — Ambulatory Visit (HOSPITAL_COMMUNITY)
Admission: RE | Admit: 2018-05-23 | Discharge: 2018-05-23 | Disposition: A | Discharge status: Home / Self Care | Payer: Medicare Other | Source: Ambulatory Visit | Attending: Gastroenterology | Admitting: Gastroenterology

## 2018-05-23 ENCOUNTER — Encounter (HOSPITAL_COMMUNITY): Payer: Self-pay

## 2018-05-23 ENCOUNTER — Other Ambulatory Visit: Payer: Self-pay

## 2018-05-23 DIAGNOSIS — J432 Centrilobular emphysema: Secondary | ICD-10-CM | POA: Diagnosis not present

## 2018-05-23 DIAGNOSIS — R160 Hepatomegaly, not elsewhere classified: Secondary | ICD-10-CM | POA: Insufficient documentation

## 2018-05-23 MED ORDER — SODIUM CHLORIDE (PF) 0.9 % IJ SOLN
INTRAMUSCULAR | Status: AC
  Filled 2018-05-23: qty 50

## 2018-05-23 MED ORDER — IOHEXOL 300 MG/ML  SOLN
75.0000 mL | Freq: Once | INTRAMUSCULAR | Status: AC | PRN
  Administered 2018-05-23: 75 mL via INTRAVENOUS

## 2018-05-24 ENCOUNTER — Other Ambulatory Visit: Payer: Self-pay | Admitting: Radiology

## 2018-05-24 ENCOUNTER — Telehealth: Payer: Self-pay | Admitting: Internal Medicine

## 2018-05-24 NOTE — Telephone Encounter (Signed)
Patient is scheduled for U/S Biopsy of her liver tomorrow.  Ashley,[facility],said they're rescheduling patients to 4 weeks out.  Caryl Pina is calling to get approval for the reschedule.  Please call Caryl Pina back to let her know.

## 2018-05-25 ENCOUNTER — Other Ambulatory Visit: Payer: Medicare Other

## 2018-05-25 ENCOUNTER — Other Ambulatory Visit: Payer: Self-pay

## 2018-05-25 ENCOUNTER — Ambulatory Visit (HOSPITAL_COMMUNITY)
Admission: RE | Admit: 2018-05-25 | Discharge: 2018-05-25 | Disposition: A | Discharge status: Home / Self Care | Payer: Medicare Other | Source: Ambulatory Visit | Attending: Gastroenterology | Admitting: Gastroenterology

## 2018-05-25 DIAGNOSIS — R197 Diarrhea, unspecified: Secondary | ICD-10-CM | POA: Diagnosis not present

## 2018-05-25 DIAGNOSIS — Z800 Family history of malignant neoplasm of digestive organs: Secondary | ICD-10-CM | POA: Insufficient documentation

## 2018-05-25 DIAGNOSIS — E785 Hyperlipidemia, unspecified: Secondary | ICD-10-CM | POA: Insufficient documentation

## 2018-05-25 DIAGNOSIS — Z9049 Acquired absence of other specified parts of digestive tract: Secondary | ICD-10-CM | POA: Diagnosis not present

## 2018-05-25 DIAGNOSIS — E1140 Type 2 diabetes mellitus with diabetic neuropathy, unspecified: Secondary | ICD-10-CM | POA: Diagnosis not present

## 2018-05-25 DIAGNOSIS — R198 Other specified symptoms and signs involving the digestive system and abdomen: Secondary | ICD-10-CM

## 2018-05-25 DIAGNOSIS — Z8553 Personal history of malignant neoplasm of renal pelvis: Secondary | ICD-10-CM | POA: Diagnosis not present

## 2018-05-25 DIAGNOSIS — K7689 Other specified diseases of liver: Secondary | ICD-10-CM | POA: Diagnosis not present

## 2018-05-25 DIAGNOSIS — Z79899 Other long term (current) drug therapy: Secondary | ICD-10-CM | POA: Insufficient documentation

## 2018-05-25 DIAGNOSIS — R110 Nausea: Secondary | ICD-10-CM

## 2018-05-25 DIAGNOSIS — C801 Malignant (primary) neoplasm, unspecified: Secondary | ICD-10-CM | POA: Diagnosis not present

## 2018-05-25 DIAGNOSIS — Z86010 Personal history of colonic polyps: Secondary | ICD-10-CM

## 2018-05-25 DIAGNOSIS — Z87891 Personal history of nicotine dependence: Secondary | ICD-10-CM | POA: Diagnosis not present

## 2018-05-25 DIAGNOSIS — R160 Hepatomegaly, not elsewhere classified: Secondary | ICD-10-CM | POA: Diagnosis not present

## 2018-05-25 DIAGNOSIS — Z880 Allergy status to penicillin: Secondary | ICD-10-CM | POA: Diagnosis not present

## 2018-05-25 DIAGNOSIS — Z7984 Long term (current) use of oral hypoglycemic drugs: Secondary | ICD-10-CM | POA: Insufficient documentation

## 2018-05-25 DIAGNOSIS — Z888 Allergy status to other drugs, medicaments and biological substances status: Secondary | ICD-10-CM | POA: Diagnosis not present

## 2018-05-25 DIAGNOSIS — J449 Chronic obstructive pulmonary disease, unspecified: Secondary | ICD-10-CM | POA: Insufficient documentation

## 2018-05-25 DIAGNOSIS — Z853 Personal history of malignant neoplasm of breast: Secondary | ICD-10-CM | POA: Diagnosis not present

## 2018-05-25 DIAGNOSIS — C787 Secondary malignant neoplasm of liver and intrahepatic bile duct: Secondary | ICD-10-CM | POA: Diagnosis not present

## 2018-05-25 DIAGNOSIS — Z801 Family history of malignant neoplasm of trachea, bronchus and lung: Secondary | ICD-10-CM | POA: Diagnosis not present

## 2018-05-25 LAB — GLUCOSE, CAPILLARY: Glucose-Capillary: 118 mg/dL — ABNORMAL HIGH (ref 70–99)

## 2018-05-25 LAB — PROTIME-INR
INR: 1 (ref 0.8–1.2)
Prothrombin Time: 12.8 seconds (ref 11.4–15.2)

## 2018-05-25 LAB — CBC
HCT: 33.9 % — ABNORMAL LOW (ref 36.0–46.0)
Hemoglobin: 9.8 g/dL — ABNORMAL LOW (ref 12.0–15.0)
MCH: 25.3 pg — ABNORMAL LOW (ref 26.0–34.0)
MCHC: 28.9 g/dL — ABNORMAL LOW (ref 30.0–36.0)
MCV: 87.4 fL (ref 80.0–100.0)
Platelets: 395 10*3/uL (ref 150–400)
RBC: 3.88 MIL/uL (ref 3.87–5.11)
RDW: 15.7 % — ABNORMAL HIGH (ref 11.5–15.5)
WBC: 14.2 10*3/uL — ABNORMAL HIGH (ref 4.0–10.5)
nRBC: 0 % (ref 0.0–0.2)

## 2018-05-25 MED ORDER — GELATIN ABSORBABLE 12-7 MM EX MISC
CUTANEOUS | Status: AC
  Filled 2018-05-25: qty 1

## 2018-05-25 MED ORDER — MIDAZOLAM HCL 2 MG/2ML IJ SOLN
INTRAMUSCULAR | Status: AC
  Filled 2018-05-25: qty 2

## 2018-05-25 MED ORDER — HYDROCODONE-ACETAMINOPHEN 5-325 MG PO TABS: 1.0000 | ORAL_TABLET | ORAL | Status: DC | PRN

## 2018-05-25 MED ORDER — FENTANYL CITRATE (PF) 100 MCG/2ML IJ SOLN
INTRAMUSCULAR | Status: AC
  Filled 2018-05-25: qty 2

## 2018-05-25 MED ORDER — FENTANYL CITRATE (PF) 100 MCG/2ML IJ SOLN
INTRAMUSCULAR | Status: AC | PRN
  Administered 2018-05-25: 25 ug via INTRAVENOUS

## 2018-05-25 MED ORDER — LIDOCAINE HCL (PF) 1 % IJ SOLN
INTRAMUSCULAR | Status: AC
  Filled 2018-05-25: qty 30

## 2018-05-25 MED ORDER — SODIUM CHLORIDE 0.9 % IV SOLN: INTRAVENOUS | Status: DC

## 2018-05-25 MED ORDER — MIDAZOLAM HCL 2 MG/2ML IJ SOLN
INTRAMUSCULAR | Status: AC | PRN
  Administered 2018-05-25 (×2): 0.5 mg via INTRAVENOUS

## 2018-05-25 NOTE — Telephone Encounter (Signed)
I left detailed message on Ashley's voice mail with Dr.Letvak's response.

## 2018-05-25 NOTE — H&P (Signed)
Chief Complaint: Patient was seen in consultation today for liver masses.  Referring Physician(s): Nelida Meuse III  Supervising Physician: Arne Cleveland  Patient Status: [facility] - Out-pt  History of Present Illness: [name] is a 73 y.o. female with a past medical history of COPD, asthma, pneumonia, pulmonary nodules, GERD, gastroparesis, diverticulosis, renal cell carcinoma, breast cancer, diabetes mellitus type II with associated neuropathy, OA, anxiety, and depression. She has history of multiple adenomatous colon polyps. She sees Dr. Rush Landmark and Dr. Loletha Carrow for management. At recent follow-up colonoscopy, patient complained of abdominal pain and was found to have a mass involving the mid upper abdomen/LUQ.  CT abdomen/pelvis 05/11/2018: 1. Multifocal hepatic metastatic disease with dominant mass in the left hepatic lobe. There are no underlying morphologic changes of cirrhosis. Tissue sampling recommended. 2. Possible new low-density mass in the cecal lumen near the ileocecal valve. This could be neoplastic or reflect a mucocele or stool ball. No bowel obstruction. 3. Multiple nodules at both lung bases have been present on multiple prior studies and do not appear significantly progressive from 2016. 4. New low-density lesion in the uncinate process of the pancreas demonstrates no aggressive characteristics. Although this could reflect a small indolent cystic neoplasm, this is unlikely to account for the metastatic disease. 5. Additional incidental findings including distal colonic diverticulosis, pelvic floor laxity and Aortic Atherosclerosis (ICD10-I70.0).  CT chest 05/23/2018: 1. Innumerable solid pulmonary nodules scattered throughout both lungs. Medial right lower lobe 0.8 cm solid pulmonary nodule is new since 2016 chest CT. Separate 0.9 cm superior segment right lower lobe solid pulmonary nodule is mildly increased in size since 2016 chest CT. These two pulmonary nodules  are indeterminate, with pulmonary metastatic disease not excluded. These pulmonary nodules are at the lower limits of PET resolution. Suggest follow-up chest CT in 3 months. All other pulmonary nodules appear stable since 2016 chest CT and are presumably benign. 2. No thoracic adenopathy. 3. Redemonstration of multiple low-attenuation liver masses and cystic uncinate process pancreatic mass, please see the 05/10/2018 CT abdomen report for further details and management recommendations. 4. Stable dilated main pulmonary artery, suggesting chronic pulmonary arterial hypertension. 5. Left main and 3 vessel coronary atherosclerosis.  IR requested by Dr. Loletha Carrow for possible image-guided liver lesion biopsy for tissue diagnosis. Patient awake and alert sitting in chair with no complaints at this time. Accompanied by husband. Denies fever, chills, chest pain, dyspnea, abdominal pain, or headache.   Past Medical History:  Diagnosis Date  . Allergy   . Anemia   . Anxiety   . Asthma   . Blood transfusion without reported diagnosis   . Breast cancer [facility])    h/o- radiation  . Carcinoid tumor of lung 1991  . Cataract    removed x 1   . COPD (chronic obstructive pulmonary disease) ([place])   . Depression   . Diabetes mellitus type II   . Diverticulosis   . Dyspnea    with exertion   . Gastroparesis   . GERD (gastroesophageal reflux disease)   . History of adenomatous polyp of colon   . History of breast cancer   . Hyperlipidemia   . Mild cognitive impairment   . Neuromuscular disorder (HCC)    neuropathy   . Osteoarthritis   . Oxygen deficiency    2 liters at bedtime   . Pneumonia   . Pulmonary nodule    multiple  . Renal cell carcinoma     Past Surgical History:  Procedure Laterality  Date  . BIOPSY  05/16/2018   Procedure: BIOPSY;  Surgeon: Rush Landmark Telford Nab., MD;  Location: [facility] ENDOSCOPY;  Service: Gastroenterology;;  . BREAST LUMPECTOMY  1998   right breast  . CHOLECYSTECTOMY   1975  . COLONOSCOPY    . COLONOSCOPY WITH PROPOFOL N/A 05/16/2018   Procedure: COLONOSCOPY WITH PROPOFOL;  Surgeon: Rush Landmark Telford Nab., MD;  Location: [facility] ENDOSCOPY;  Service: Gastroenterology;  Laterality: N/A;  . HEMOSTASIS CLIP PLACEMENT  05/16/2018   Procedure: HEMOSTASIS CLIP PLACEMENT;  Surgeon: Irving Copas., MD;  Location: [facility];  Service: Gastroenterology;;  . HIP FRACTURE SURGERY Left 03/2018  . HIP PINNING,CANNULATED Left 04/06/2018   Procedure: CANNULATED SCREWS/HIP PINNING LEFT HIP;  Surgeon: Mcarthur Rossetti, MD;  Location: [facility] ORS;  Service: Orthopedics;  Laterality: Left;  . LAPAROSCOPIC NEPHRECTOMY  01/2009   right  . LUNG SURGERY  1994   carcinoid removal  . POLYPECTOMY    . POLYPECTOMY  05/16/2018   Procedure: POLYPECTOMY;  Surgeon: Mansouraty, Telford Nab., MD;  Location: [facility];  Service: Gastroenterology;;  . TONSILLECTOMY AND ADENOIDECTOMY  1953    Allergies: Budesonide-formoterol fumarate; Lipitor [atorvastatin]; and Penicillins  Medications: Prior to Admission medications   Medication Sig Start Date End Date Taking? Authorizing Provider  albuterol (PROVENTIL HFA;VENTOLIN HFA) 108 (90 Base) MCG/ACT inhaler Inhale 2 puffs into the lungs every 6 (six) hours as needed for wheezing or shortness of breath.     [provider]  albuterol (PROVENTIL) (2.5 MG/3ML) 0.083% nebulizer solution Take 3 mLs (2.5 mg total) by nebulization every 6 (six) hours as needed for wheezing or shortness of breath. 04/11/18   Venia Carbon, MD  bismuth subsalicylate (PEPTO BISMOL) 262 MG/15ML suspension Take 30 mLs by mouth every 6 (six) hours as needed for indigestion.    [provider]  cholecalciferol (VITAMIN D3) 25 MCG (1000 UT) tablet Take 1,000 Units by mouth daily.    [provider]  fexofenadine (ALLEGRA) 180 MG tablet Take 180 mg by mouth daily as needed (nasal drainage).    [provider]  fluticasone  (FLONASE) 50 MCG/ACT nasal spray USE 2 SPRAYS NASALLY DAILY Patient taking differently: Place 2 sprays into both nostrils daily.  12/22/17   Venia Carbon, MD  metFORMIN (GLUCOPHAGE) 500 MG tablet TAKE 1 TABLET TWICE A DAY  WITH MEALS Patient taking differently: Take 500 mg by mouth 2 (two) times daily.  04/14/18   Venia Carbon, MD  mometasone-formoterol (DULERA) 200-5 MCG/ACT AERO Take 2 puffs first thing in am and then another 2 puffs about 12 hours later. Patient taking differently: Inhale 2 puffs into the lungs 2 (two) times daily. Take 2 puffs first thing in am and then another 2 puffs about 12 hours later. 04/26/18   Venia Carbon, MD  Nutritional Supplements (JUICE PLUS FIBRE PO) Take 3 tablets by mouth daily. Take 1 tablet of Fruit, Vegetable, and Herbalist, Historical, MD  Omega-3 Fatty Acids (FISH OIL) 1200 MG CAPS Take 1,200 mg by mouth daily.     [provider]  pravastatin (PRAVACHOL) 40 MG tablet TAKE 1 TABLET DAILY Patient taking differently: Take 40 mg by mouth daily.  04/14/18   Venia Carbon, MD  pregabalin (LYRICA) 75 MG capsule Take 1 capsule (75 mg total) by mouth 3 (three) times daily. Patient taking differently: Take 75 mg by mouth 2 (two) times daily.  11/08/17   Venia Carbon, MD  tiotropium (SPIRIVA HANDIHALER) 18  MCG inhalation capsule INHALE THE CONTENTS OF 1   CAPSULE VIA HANDIHALER     DAILY Patient taking differently: Place 18 mcg into inhaler and inhale daily. INHALE THE CONTENTS OF 1   CAPSULE VIA HANDIHALER     DAILY 05/16/17   Venia Carbon, MD     Family History  Problem Relation Age of Onset  . Colon cancer Father   . Lung cancer Father   . Stroke Mother   . Breast cancer Neg Hx   . Ovarian cancer Neg Hx   . Diabetes Neg Hx   . Colon polyps Neg Hx   . Esophageal cancer Neg Hx   . Rectal cancer Neg Hx   . Stomach cancer Neg Hx     Social History   Socioeconomic History  . Marital status: Married    Spouse  name: Not on file  . Number of children: 2  . Years of education: Not on file  . Highest education level: Not on file  Occupational History  . Occupation: retired Designer, jewellery: RETIRED  Social Needs  . Financial resource strain: Not on file  . Food insecurity:    Worry: Not on file    Inability: Not on file  . Transportation needs:    Medical: Not on file    Non-medical: Not on file  Tobacco Use  . Smoking status: Former Smoker    Packs/day: 1.00    Years: 30.00    Pack years: 30.00    Types: Cigarettes    Last attempt to quit: 03/08/1993    Years since quitting: 25.2  . Smokeless tobacco: Never Used  Substance and Sexual Activity  . Alcohol use: No  . Drug use: No  . Sexual activity: Not Currently    Birth control/protection: Post-menopausal  Lifestyle  . Physical activity:    Days per week: Not on file    Minutes per session: Not on file  . Stress: Not on file  Relationships  . Social connections:    Talks on phone: Not on file    Gets together: Not on file    Attends religious service: Not on file    Active member of club or organization: Not on file    Attends meetings of clubs or organizations: Not on file    Relationship status: Not on file  Other Topics Concern  . Not on file  Social History Narrative   Has living will   Requests husband as health care POA--daughter [name] would be alternate   Now has DNR-- done 2/19   No tube feeds if cognitively unaware           Review of Systems: A 12 point ROS discussed and pertinent positives are indicated in the HPI above.  All other systems are negative.  Review of Systems  Constitutional: Negative for chills and fever.  Respiratory: Negative for shortness of breath and wheezing.   Cardiovascular: Negative for chest pain and palpitations.  Gastrointestinal: Negative for abdominal pain.  Neurological: Negative for headaches.  Psychiatric/Behavioral: Negative for behavioral problems and confusion.     Vital Signs: BP 126/83   Pulse 96   Temp 97.8 F (36.6 C)   Resp 16   Ht 5\' 4"  (1.626 m)   Wt 118 lb (53.5 kg)   SpO2 92%   BMI 20.25 kg/m   Physical Exam Vitals signs and nursing note reviewed.  Constitutional:      General: She is not in acute  distress.    Appearance: Normal appearance.  Cardiovascular:     Rate and Rhythm: Normal rate and regular rhythm.     Heart sounds: Normal heart sounds. No murmur.  Pulmonary:     Effort: Pulmonary effort is normal. No respiratory distress.     Breath sounds: Normal breath sounds. No wheezing.  Skin:    General: Skin is warm and dry.  Neurological:     Mental Status: She is alert and oriented to person, place, and time.  Psychiatric:        Mood and Affect: Mood normal.        Behavior: Behavior normal.        Thought Content: Thought content normal.        Judgment: Judgment normal.      MD Evaluation Airway: WNL Heart: WNL Abdomen: WNL Chest/ Lungs: WNL ASA  Classification: 2 Mallampati/Airway Score: One   Imaging: Ct Chest W Contrast  Result Date: 05/23/2018 CLINICAL DATA:  Multiple indeterminate liver masses. Chest staging evaluation. Remote history of right breast, renal and lung cancer. EXAM: CT CHEST WITH CONTRAST TECHNIQUE: Multidetector CT imaging of the chest was performed during intravenous contrast administration. CONTRAST:  22mL OMNIPAQUE IOHEXOL 300 MG/ML  SOLN COMPARISON:  05/10/2018 CT abdomen/pelvis.  05/15/2014 chest CT. FINDINGS: Cardiovascular: Normal heart size. No significant pericardial effusion/thickening. Left main and 3 vessel coronary atherosclerosis. Atherosclerotic nonaneurysmal thoracic aorta. Dilated main pulmonary artery (3.6 cm diameter), stable. No central pulmonary emboli. Mediastinum/Nodes: Stable hypodense bilateral thyroid lobe nodules, largest 1.4 cm in the posterior left thyroid lobe. Unremarkable esophagus. Right axillary surgical clips are noted. No axillary adenopathy. No  pathologically enlarged mediastinal or hilar nodes. Lungs/Pleura: No pneumothorax. No pleural effusion. Severe centrilobular emphysema with mild diffuse bronchial wall thickening. Wedge resection suture line is noted in the right middle lobe. No acute consolidative airspace disease or lung masses. Innumerable (> than 30) solid pulmonary nodules scattered throughout both lungs, most of which appear stable since 05/15/2014 chest CT, including dominant 1.9 cm posterior right upper lobe nodule (series 5/image 63). Superior segment right lower lobe 0.9 cm solid pulmonary nodule (series 5/image 72), previously 0.7 cm, mildly increased. Medial right lower lobe 0.8 cm solid pulmonary nodule (series 5/image 73), which appears new. Upper abdomen: Redemonstration multiple hypodense liver masses, largest 8.9 cm in the lateral segment left liver lobe, not appreciably changed since 05/10/2018 CT abdomen study. Partially visualized right nephrectomy. Subcentimeter hypodense anterior interpolar left renal cortical lesion, too small to characterize. Cystic 2.1 cm uncinate process pancreatic lesion (series 2/image 160). Musculoskeletal: No aggressive appearing focal osseous lesions. Mild thoracic spondylosis. Stable post thoracotomy changes in the right mid ribs. IMPRESSION: 1. Innumerable solid pulmonary nodules scattered throughout both lungs. Medial right lower lobe 0.8 cm solid pulmonary nodule is new since 2016 chest CT. Separate 0.9 cm superior segment right lower lobe solid pulmonary nodule is mildly increased in size since 2016 chest CT. These two pulmonary nodules are indeterminate, with pulmonary metastatic disease not excluded. These pulmonary nodules are at the lower limits of PET resolution. Suggest follow-up chest CT in 3 months. All other pulmonary nodules appear stable since 2016 chest CT and are presumably benign. 2. No thoracic adenopathy. 3. Redemonstration of multiple low-attenuation liver masses and cystic  uncinate process pancreatic mass, please see the 05/10/2018 CT abdomen report for further details and management recommendations. 4. Stable dilated main pulmonary artery, suggesting chronic pulmonary arterial hypertension. 5. Left main and 3 vessel coronary atherosclerosis. Aortic Atherosclerosis (ICD10-I70.0) and  Emphysema (ICD10-J43.9). Electronically Signed   By: Ilona Sorrel M.D.   On: 05/23/2018 12:49   Ct Abdomen Pelvis W Contrast  Result Date: 05/10/2018 CLINICAL DATA:  Abdominal pain with nausea and vomiting. Remote history of breast cancer, renal cell carcinoma and carcinoid tumor of the lung. EXAM: CT ABDOMEN AND PELVIS WITH CONTRAST TECHNIQUE: Multidetector CT imaging of the abdomen and pelvis was performed using the standard protocol following bolus administration of intravenous contrast. CONTRAST:  1mL OMNIPAQUE IOHEXOL 300 MG/ML  SOLN COMPARISON:  Abdominopelvic CT 05/20/2011.  Chest CT 05/15/2014. FINDINGS: Lower chest: Numerous nodules are again noted at both lung bases, measuring up to 9 mm in the right lower lobe on image 2/5. These have not significantly changed from 2016, although there are a few in the left lower lobe which may be minimally larger from 2013. There are postsurgical changes at the right lung base. No significant pleural or pericardial effusion. Coronary artery atherosclerosis noted. Hepatobiliary: There is a new large, heterogeneously enhancing and partially exophytic mass in the left hepatic lobe, measuring up to 9.2 x 6.8 cm on image 20/3. This extends 9.0 cm on coronal image 47/6. Superior to this lesion, there is a stable 2.6 x 1.7 cm lesion on image 9/3, previously characterized as a hemangioma. However, several other new hepatic lesions are present (at least 4), measuring up to 1.5 cm inferiorly in the right lobe on images 22 and 26 of series 3. No morphologic changes of cirrhosis. No significant biliary dilatation post cholecystectomy. Pancreas: There is a new  low-density mass in the uncinate process of the pancreas, measuring 2.0 x 1.4 cm on image 23/3. This demonstrates no definite solid components or enhancement and appears cystic. There is no pancreatic ductal dilatation or surrounding inflammation. Spleen: Normal in size without focal abnormality. Adrenals/Urinary Tract: Both adrenal glands appear normal. There is no mass in the right nephrectomy bed. Small left renal cysts have mildly enlarged, although there are no suspicious renal masses or hydronephrosis. Aside from pelvic floor laxity, the bladder appears normal. Stomach/Bowel: The stomach and small bowel appear normal. There is a possible low-density mass within the cecal lumen near the ileocecal valve, measuring 5.4 x 4.6 cm on image 41/3. No resulting bowel obstruction. Diverticular changes are present within the descending and sigmoid colon without wall thickening or surrounding inflammation. Vascular/Lymphatic: There are no enlarged abdominal or pelvic lymph nodes. No acute vascular findings. There is diffuse aortic and branch vessel atherosclerosis. The IVC and left renal vein appear normal. Reproductive: Stable mild lobularity of the uterus consistent with fibroids. Stable mild prominence of the left ovary. No suspicious adnexal findings. Pelvic floor laxity noted. Other: No ascites or peritoneal nodularity. Musculoskeletal: No acute or significant osseous findings. Lumbar spondylosis with associated mild convex right scoliosis. Previous left hip pinning. IMPRESSION: 1. Multifocal hepatic metastatic disease with dominant mass in the left hepatic lobe. There are no underlying morphologic changes of cirrhosis. Tissue sampling recommended. 2. Possible new low-density mass in the cecal lumen near the ileocecal valve. This could be neoplastic or reflect a mucocele or stool ball. No bowel obstruction. 3. Multiple nodules at both lung bases have been present on multiple prior studies and do not appear  significantly progressive from 2016. 4. New low-density lesion in the uncinate process of the pancreas demonstrates no aggressive characteristics. Although this could reflect a small indolent cystic neoplasm, this is unlikely to account for the metastatic disease. 5. Additional incidental findings including distal colonic diverticulosis, pelvic floor laxity  and Aortic Atherosclerosis (ICD10-I70.0). 6. These results will be called to the ordering clinician or representative by the Radiologist Assistant, and communication documented in the PACS or zVision Dashboard. Electronically Signed   By: Richardean Sale M.D.   On: 05/10/2018 10:24    Labs:  CBC: Recent Labs    04/06/18 0900 04/07/18 0455 04/08/18 0444 05/09/18 1546  WBC 16.4* 11.6* 9.7 13.0*  HGB 11.1* 8.9* 8.9* 9.9*  HCT 38.1 30.1* 31.1* 31.1*  PLT 360 289 315 381.0    COAGS: Recent Labs    05/18/18 1508  INR 1.0    BMP: Recent Labs    04/06/18 0900 04/07/18 0455 04/08/18 0444 05/09/18 1546  NA 138 138 141 138  K 5.1 5.0 4.6 3.9  CL 104 107 110 105  CO2 25 24 26 24   GLUCOSE 116* 161* 105* 109*  BUN 18 16 16 12   CALCIUM 8.7* 7.8* 8.1* 8.5  CREATININE 1.36* 1.34* 1.26* 1.13  GFRNONAA 38* 39* 42*  --   GFRAA 44* 45* 49*  --     LIVER FUNCTION TESTS: Recent Labs    05/09/18 1546  BILITOT 0.2  AST 7  ALT 7  ALKPHOS 77  PROT 6.0  ALBUMIN 3.1*     Assessment and Plan:  Liver masses. Plan for image-guided liver mass biopsy today with Dr. Vernard Gambles. Patient is NPO. Afebrile. She does not take blood thinners. INR pending.  Risks and benefits discussed with the patient including, but not limited to bleeding, infection, damage to adjacent structures or low yield requiring additional tests. All of the patient's questions were answered, patient is agreeable to proceed. Consent signed and in chart.   Thank you for this interesting consult.  I greatly enjoyed meeting [name] and look forward to  participating in their care.  A copy of this report was sent to the requesting provider on this date.  Electronically Signed: Earley Abide, PA-C 05/25/2018, 12:25 PM   I spent a total of 30 Minutes in face to face in clinical consultation, greater than 50% of which was counseling/coordinating care for liver masses.

## 2018-05-25 NOTE — Telephone Encounter (Signed)
This is supposed to be for a biopsy of likely cancer. It must not be postponed!!

## 2018-05-25 NOTE — Progress Notes (Signed)
Larene Beach PA notified of O2 sats 86-88% on room air and client's husband states client wears O2 at night and O2 started at 2l/min via nasal cannula and O2 sats increased to 100%

## 2018-05-25 NOTE — Discharge Instructions (Addendum)

## 2018-05-25 NOTE — Procedures (Signed)
Interventional Radiology Procedure:   Indications: Liver mass   Procedure: US guided core biopsy of left hepatic lobe   Findings: Large mass in left hepatic lobe, 4 cores obtained.   Complications: None     EBL: Minimal, less than 5 ml  Plan: Bedrest 3 hours.     [name] R. Anselm Pancoast, MD  Pager: [phone]

## 2018-05-26 LAB — CLOSTRIDIUM DIFFICILE TOXIN B, QUALITATIVE, REAL-TIME PCR: Toxigenic C. Difficile by PCR: NOT DETECTED

## 2018-05-26 NOTE — Telephone Encounter (Signed)
I spoke to [place] and she said patient had the procedure done.

## 2018-05-26 NOTE — Telephone Encounter (Signed)
Good. Pathology is still pending---will await that report

## 2018-05-30 ENCOUNTER — Other Ambulatory Visit: Payer: Self-pay

## 2018-05-30 ENCOUNTER — Other Ambulatory Visit: Payer: Self-pay | Admitting: Gastroenterology

## 2018-05-30 DIAGNOSIS — C649 Malignant neoplasm of unspecified kidney, except renal pelvis: Secondary | ICD-10-CM

## 2018-05-30 DIAGNOSIS — R160 Hepatomegaly, not elsewhere classified: Secondary | ICD-10-CM

## 2018-05-30 MED ORDER — ONDANSETRON HCL 4 MG PO TABS: 4.0000 mg | ORAL_TABLET | Freq: Four times a day (QID) | ORAL | 0 refills | Status: DC | PRN

## 2018-05-31 ENCOUNTER — Telehealth: Payer: Self-pay | Admitting: Gastroenterology

## 2018-05-31 NOTE — Telephone Encounter (Signed)
Pt's wife called requesting referral for oncology be sent to [facility] instead of [facility]. He stated that they have a relative that was successfully treated there for liver cancer.

## 2018-05-31 NOTE — Telephone Encounter (Signed)
Dr. Loletha Carrow please see below and advise which physician at [facility] you would like to refer pt to.

## 2018-05-31 NOTE — Telephone Encounter (Signed)
I do not know any oncologists at [facility].  For timely and outstanding cancer care, I HIGHLY recommend they see the [facility] cancer clinic. If, after doing so, they would like another opinion, then our oncologist can most likely recommend someone at an academic institution.

## 2018-06-01 ENCOUNTER — Other Ambulatory Visit: Payer: Self-pay

## 2018-06-01 DIAGNOSIS — C787 Secondary malignant neoplasm of liver and intrahepatic bile duct: Principal | ICD-10-CM

## 2018-06-01 DIAGNOSIS — C649 Malignant neoplasm of unspecified kidney, except renal pelvis: Secondary | ICD-10-CM

## 2018-06-01 NOTE — Telephone Encounter (Signed)
I spoke with [name] just now by phone and made my case for why I think [name] will get what she needs sooner with a visit at out cancer center.  He is agreeable.

## 2018-06-01 NOTE — Telephone Encounter (Signed)
Spoke with pts husband and let them know Dr. Loletha Carrow' recommendations. They still strongly request referral to [facility] first. Please advise.

## 2018-06-01 NOTE — Telephone Encounter (Signed)
Referral entered in epic for cancer center again as previous referral had been cancelled. Dawn Placke RN notified pt has decided to stay at the cancer center local.

## 2018-06-06 ENCOUNTER — Inpatient Hospital Stay: Payer: Medicare Other | Attending: Oncology | Admitting: Oncology

## 2018-06-06 ENCOUNTER — Telehealth: Payer: Self-pay | Admitting: Oncology

## 2018-06-06 ENCOUNTER — Other Ambulatory Visit: Payer: Self-pay

## 2018-06-06 VITALS — BP 118/68 | HR 95 | Temp 98.2°F | Resp 18 | Ht 64.0 in | Wt 125.3 lb

## 2018-06-06 DIAGNOSIS — C641 Malignant neoplasm of right kidney, except renal pelvis: Secondary | ICD-10-CM

## 2018-06-06 DIAGNOSIS — C787 Secondary malignant neoplasm of liver and intrahepatic bile duct: Secondary | ICD-10-CM

## 2018-06-06 DIAGNOSIS — R634 Abnormal weight loss: Secondary | ICD-10-CM

## 2018-06-06 DIAGNOSIS — C649 Malignant neoplasm of unspecified kidney, except renal pelvis: Secondary | ICD-10-CM

## 2018-06-06 DIAGNOSIS — R110 Nausea: Secondary | ICD-10-CM

## 2018-06-06 DIAGNOSIS — R10819 Abdominal tenderness, unspecified site: Secondary | ICD-10-CM

## 2018-06-06 DIAGNOSIS — Z853 Personal history of malignant neoplasm of breast: Secondary | ICD-10-CM

## 2018-06-06 DIAGNOSIS — Z86010 Personal history of colonic polyps: Secondary | ICD-10-CM

## 2018-06-06 DIAGNOSIS — Z905 Acquired absence of kidney: Secondary | ICD-10-CM

## 2018-06-06 DIAGNOSIS — R600 Localized edema: Secondary | ICD-10-CM | POA: Diagnosis not present

## 2018-06-06 DIAGNOSIS — K219 Gastro-esophageal reflux disease without esophagitis: Secondary | ICD-10-CM

## 2018-06-06 DIAGNOSIS — R918 Other nonspecific abnormal finding of lung field: Secondary | ICD-10-CM | POA: Diagnosis not present

## 2018-06-06 DIAGNOSIS — Z87891 Personal history of nicotine dependence: Secondary | ICD-10-CM | POA: Diagnosis not present

## 2018-06-06 DIAGNOSIS — D3A090 Benign carcinoid tumor of the bronchus and lung: Secondary | ICD-10-CM | POA: Diagnosis not present

## 2018-06-06 DIAGNOSIS — Z801 Family history of malignant neoplasm of trachea, bronchus and lung: Secondary | ICD-10-CM

## 2018-06-06 DIAGNOSIS — Z803 Family history of malignant neoplasm of breast: Secondary | ICD-10-CM

## 2018-06-06 NOTE — Telephone Encounter (Signed)
No los per 3/31.

## 2018-06-06 NOTE — Progress Notes (Signed)
Reason for the request:   Renal cell cancer  HPI: I was asked by Dr. Loletha Carrow to evaluate [name] for advanced renal cell carcinoma.  She is 73 year old woman with history of diabetes and GERD as well as a resected renal cell carcinoma diagnosed in 2010.  At that time she presented with right kidney mass in 2010.  Percutaneous biopsy was nondiagnostic and subsequently underwent right nephrectomy completed on November 17 of 2010.  The final pathology showed a 4.3 cm renal cell carcinoma, papillary subtype.  Final pathology staging was T1b without any clinical metastasis.  Histological grade was grade 3.  She remained disease-free in the interval and started presenting with GI complaints.  She has reported abdominal discomfort, nausea and weight loss.  CT scan of the abdomen and pelvis done on May 10, 2018 showed a large left hepatic mass measuring 9.2 x 6.8 cm with several new lesions are present measuring around 1.5 cm.  CT scan of the chest showed multiple pulmonary nodules scattered throughout the lungs with the medial right lower lobe measuring 0.8 cm which is new since 2016 chest.  There is a 0.9 cm right lower lobe has mildly increased since 2016.  There are other pulmonary nodules have appeared stable since 2016.  Biopsy of the hepatic mass completed on 05/25/2018 which showed metastatic carcinoma with cells are positive for cytokeratin 7, PAX 8 but negative for cytokeratin 20 CDX 2, GATA-3 and CD10.  The work-up consistent with papillary renal cell carcinoma.  Based on these findings she was referred to me for evaluation.  Clinically, she reports overall recent debilitation with overall weakness and fatigue.  She is ambulatory with the help of a cane without any falls or syncope.  She does have persistent nausea and occasional diarrhea after eating.  She does report some abdominal discomfort as well.  She denies any respiratory complaints including fever or shortness of breath.  Her performance status  is limited.  She does not report any headaches, blurry vision, syncope or seizures. Does not report any fevers, chills or sweats.  Does not report any cough, wheezing or hemoptysis.  Does not report any chest pain, palpitation, orthopnea.  She does report mild leg edema. Does not report any constipation or diarrhea.  Does not report any skeletal complaints.    Does not report frequency, urgency or hematuria.  Does not report any skin rashes or lesions. Does not report any heat or cold intolerance.  Does not report any lymphadenopathy or petechiae.  Does not report any anxiety or depression.  Remaining review of systems is negative.    Past Medical History:  Diagnosis Date  . Allergy   . Anemia   . Anxiety   . Asthma   . Blood transfusion without reported diagnosis   . Breast cancer [facility])    h/o- radiation  . Carcinoid tumor of lung 1991  . Cataract    removed x 1   . COPD (chronic obstructive pulmonary disease) ([place])   . Depression   . Diabetes mellitus type II   . Diverticulosis   . Dyspnea    with exertion   . Gastroparesis   . GERD (gastroesophageal reflux disease)   . History of adenomatous polyp of colon   . History of breast cancer   . Hyperlipidemia   . Mild cognitive impairment   . Neuromuscular disorder (HCC)    neuropathy   . Osteoarthritis   . Oxygen deficiency    2 liters at bedtime   .  Pneumonia   . Pulmonary nodule    multiple  . Renal cell carcinoma   :  Past Surgical History:  Procedure Laterality Date  . BIOPSY  05/16/2018   Procedure: BIOPSY;  Surgeon: Rush Landmark Telford Nab., MD;  Location: [facility];  Service: Gastroenterology;;  . BREAST LUMPECTOMY  1998   right breast  . CHOLECYSTECTOMY  1975  . COLONOSCOPY    . COLONOSCOPY WITH PROPOFOL N/A 05/16/2018   Procedure: COLONOSCOPY WITH PROPOFOL;  Surgeon: Rush Landmark Telford Nab., MD;  Location: [facility];  Service: Gastroenterology;  Laterality: N/A;  . HEMOSTASIS CLIP PLACEMENT  05/16/2018    Procedure: HEMOSTASIS CLIP PLACEMENT;  Surgeon: Irving Copas., MD;  Location: [facility];  Service: Gastroenterology;;  . HIP FRACTURE SURGERY Left 03/2018  . HIP PINNING,CANNULATED Left 04/06/2018   Procedure: CANNULATED SCREWS/HIP PINNING LEFT HIP;  Surgeon: Mcarthur Rossetti, MD;  Location: [facility];  Service: Orthopedics;  Laterality: Left;  . LAPAROSCOPIC NEPHRECTOMY  01/2009   right  . LUNG SURGERY  1994   carcinoid removal  . POLYPECTOMY    . POLYPECTOMY  05/16/2018   Procedure: POLYPECTOMY;  Surgeon: Mansouraty, Telford Nab., MD;  Location: [facility];  Service: Gastroenterology;;  . TONSILLECTOMY AND ADENOIDECTOMY  1953  :   Current Outpatient Medications:  .  albuterol (PROVENTIL HFA;VENTOLIN HFA) 108 (90 Base) MCG/ACT inhaler, Inhale 2 puffs into the lungs every 6 (six) hours as needed for wheezing or shortness of breath. , Disp: , Rfl:  .  albuterol (PROVENTIL) (2.5 MG/3ML) 0.083% nebulizer solution, Take 3 mLs (2.5 mg total) by nebulization every 6 (six) hours as needed for wheezing or shortness of breath., Disp: 150 mL, Rfl: 1 .  bismuth subsalicylate (PEPTO BISMOL) 262 MG/15ML suspension, Take 30 mLs by mouth every 6 (six) hours as needed for indigestion., Disp: , Rfl:  .  cholecalciferol (VITAMIN D3) 25 MCG (1000 UT) tablet, Take 1,000 Units by mouth daily., Disp: , Rfl:  .  fexofenadine (ALLEGRA) 180 MG tablet, Take 180 mg by mouth daily as needed (nasal drainage)., Disp: , Rfl:  .  fluticasone (FLONASE) 50 MCG/ACT nasal spray, USE 2 SPRAYS NASALLY DAILY (Patient taking differently: Place 2 sprays into both nostrils daily. ), Disp: 48 g, Rfl: 3 .  metFORMIN (GLUCOPHAGE) 500 MG tablet, TAKE 1 TABLET TWICE A DAY  WITH MEALS (Patient taking differently: Take 500 mg by mouth 2 (two) times daily. ), Disp: 180 tablet, Rfl: 0 .  mometasone-formoterol (DULERA) 200-5 MCG/ACT AERO, Take 2 puffs first thing in am and then another 2 puffs about 12 hours later. (Patient  taking differently: Inhale 2 puffs into the lungs 2 (two) times daily. Take 2 puffs first thing in am and then another 2 puffs about 12 hours later.), Disp: 3 Inhaler, Rfl: 3 .  Nutritional Supplements (JUICE PLUS FIBRE PO), Take 3 tablets by mouth daily. Take 1 tablet of Fruit, Vegetable, and [employer], Disp: , Rfl:  .  Omega-3 Fatty Acids (FISH OIL) 1200 MG CAPS, Take 1,200 mg by mouth daily. , Disp: , Rfl:  .  ondansetron (ZOFRAN) 4 MG tablet, Take 1 tablet (4 mg total) by mouth every 6 (six) hours as needed for nausea or vomiting., Disp: 60 tablet, Rfl: 0 .  pravastatin (PRAVACHOL) 40 MG tablet, TAKE 1 TABLET DAILY (Patient taking differently: Take 40 mg by mouth daily. ), Disp: 90 tablet, Rfl: 0 .  pregabalin (LYRICA) 75 MG capsule, Take 1 capsule (75 mg total) by mouth 3 (three) times  daily. (Patient taking differently: Take 75 mg by mouth 2 (two) times daily. ), Disp: 270 capsule, Rfl: 3 .  tiotropium (SPIRIVA HANDIHALER) 18 MCG inhalation capsule, INHALE THE CONTENTS OF 1   CAPSULE VIA HANDIHALER     DAILY (Patient taking differently: Place 18 mcg into inhaler and inhale daily. INHALE THE CONTENTS OF 1   CAPSULE VIA HANDIHALER     DAILY), Disp: 90 capsule, Rfl: 3:  Allergies  Allergen Reactions  . Budesonide-Formoterol Fumarate Other (See Comments)    (Symbicort) coughed worse  . Lipitor [Atorvastatin] Other (See Comments)    Nosebleeds  . Penicillins Rash    Did it involve swelling of the face/tongue/throat, SOB, or low BP? No Did it involve sudden or severe rash/hives, skin peeling, or any reaction on the inside of your mouth or nose? No Did you need to seek medical attention at a hospital or doctor's office? No When did it last happen?childhood allergy If all above answers are "NO", may proceed with cephalosporin use.   :  Family History  Problem Relation Age of Onset  . Colon cancer Father   . Lung cancer Father   . Stroke Mother   . Breast cancer Neg Hx   . Ovarian  cancer Neg Hx   . Diabetes Neg Hx   . Colon polyps Neg Hx   . Esophageal cancer Neg Hx   . Rectal cancer Neg Hx   . Stomach cancer Neg Hx   :  Social History   Socioeconomic History  . Marital status: Married    Spouse name: Not on file  . Number of children: 2  . Years of education: Not on file  . Highest education level: Not on file  Occupational History  . Occupation: retired Designer, jewellery: RETIRED  Social Needs  . Financial resource strain: Not on file  . Food insecurity:    Worry: Not on file    Inability: Not on file  . Transportation needs:    Medical: Not on file    Non-medical: Not on file  Tobacco Use  . Smoking status: Former Smoker    Packs/day: 1.00    Years: 30.00    Pack years: 30.00    Types: Cigarettes    Last attempt to quit: 03/08/1993    Years since quitting: 25.2  . Smokeless tobacco: Never Used  Substance and Sexual Activity  . Alcohol use: No  . Drug use: No  . Sexual activity: Not Currently    Birth control/protection: Post-menopausal  Lifestyle  . Physical activity:    Days per week: Not on file    Minutes per session: Not on file  . Stress: Not on file  Relationships  . Social connections:    Talks on phone: Not on file    Gets together: Not on file    Attends religious service: Not on file    Active member of club or organization: Not on file    Attends meetings of clubs or organizations: Not on file    Relationship status: Not on file  . Intimate partner violence:    Fear of current or ex partner: Not on file    Emotionally abused: Not on file    Physically abused: Not on file    Forced sexual activity: Not on file  Other Topics Concern  . Not on file  Social History Narrative   Has living will   Requests husband as health care POA--daughter [name] would be alternate  Now has DNR-- done 2/19   No tube feeds if cognitively unaware        :  Pertinent items are noted in HPI.  Exam: Blood pressure 118/68, pulse  95, temperature 98.2 F (36.8 C), temperature source Oral, resp. rate 18, height 5\' 4"  (1.626 m), weight 125 lb 4.8 oz (56.8 kg), SpO2 93 %.  ECOG 2 General appearance: alert and cooperative appeared without distress. Head: atraumatic without any abnormalities. Eyes: conjunctivae/corneas clear. PERRL.  Sclera anicteric. Throat: lips, mucosa, and tongue normal; without oral thrush or ulcers. Resp: clear to auscultation bilaterally without rhonchi, wheezes or dullness to percussion. Cardio: regular rate and rhythm, S1, S2.  Lower extremity edema noted bilaterally. GI: soft, non-tender; bowel sounds normal; no masses,  no organomegaly Skin: Skin color, texture, turgor normal. No rashes or lesions Lymph nodes: Cervical, supraclavicular, and axillary nodes normal. Neurologic: Grossly normal without any motor, sensory or deep tendon reflexes. Musculoskeletal: No joint deformity or effusion.  CBC    Component Value Date/Time   WBC 14.2 (H) 05/25/2018 1232   RBC 3.88 05/25/2018 1232   HGB 9.8 (L) 05/25/2018 1232   HGB 7.5 (L) 04/15/2014 0453   HCT 33.9 (L) 05/25/2018 1232   HCT 23.6 (L) 04/15/2014 0453   PLT 395 05/25/2018 1232   PLT 284 04/15/2014 0453   MCV 87.4 05/25/2018 1232   MCV 89 04/15/2014 0453   MCH 25.3 (L) 05/25/2018 1232   MCHC 28.9 (L) 05/25/2018 1232   RDW 15.7 (H) 05/25/2018 1232   RDW 16.6 (H) 04/15/2014 0453   LYMPHSABS 2.6 01/26/2017 1303   LYMPHSABS 1.6 04/15/2014 0453   MONOABS 0.8 01/26/2017 1303   MONOABS 0.4 04/15/2014 0453   EOSABS 0.1 01/26/2017 1303   EOSABS 0.0 04/15/2014 0453   BASOSABS 0.0 01/26/2017 1303   BASOSABS 0.0 04/15/2014 0453     Chemistry      Component Value Date/Time   NA 138 05/09/2018 1546   NA 139 04/14/2014 0409   K 3.9 05/09/2018 1546   K 5.1 04/14/2014 0409   CL 105 05/09/2018 1546   CL 105 04/14/2014 0409   CO2 24 05/09/2018 1546   CO2 27 04/14/2014 0409   BUN 12 05/09/2018 1546   BUN 17 04/14/2014 0409   CREATININE 1.13  05/09/2018 1546   CREATININE 1.20 (H) 04/16/2014 1031      Component Value Date/Time   CALCIUM 8.5 05/09/2018 1546   CALCIUM 8.7 04/14/2014 0409   ALKPHOS 77 05/09/2018 1546   ALKPHOS 53 04/13/2014 1115   AST 7 05/09/2018 1546   AST 14 (L) 04/13/2014 1115   ALT 7 05/09/2018 1546   ALT 30 04/13/2014 1115   BILITOT 0.2 05/09/2018 1546   BILITOT 0.2 04/13/2014 1115        Ct Chest W Contrast  Result Date: 05/23/2018 CLINICAL DATA:  Multiple indeterminate liver masses. Chest staging evaluation. Remote history of right breast, renal and lung cancer. EXAM: CT CHEST WITH CONTRAST TECHNIQUE: Multidetector CT imaging of the chest was performed during intravenous contrast administration. CONTRAST:  29mL OMNIPAQUE IOHEXOL 300 MG/ML  SOLN COMPARISON:  05/10/2018 CT abdomen/pelvis.  05/15/2014 chest CT. FINDINGS: Cardiovascular: Normal heart size. No significant pericardial effusion/thickening. Left main and 3 vessel coronary atherosclerosis. Atherosclerotic nonaneurysmal thoracic aorta. Dilated main pulmonary artery (3.6 cm diameter), stable. No central pulmonary emboli. Mediastinum/Nodes: Stable hypodense bilateral thyroid lobe nodules, largest 1.4 cm in the posterior left thyroid lobe. Unremarkable esophagus. Right axillary surgical clips are noted. No axillary  adenopathy. No pathologically enlarged mediastinal or hilar nodes. Lungs/Pleura: No pneumothorax. No pleural effusion. Severe centrilobular emphysema with mild diffuse bronchial wall thickening. Wedge resection suture line is noted in the right middle lobe. No acute consolidative airspace disease or lung masses. Innumerable (> than 30) solid pulmonary nodules scattered throughout both lungs, most of which appear stable since 05/15/2014 chest CT, including dominant 1.9 cm posterior right upper lobe nodule (series 5/image 63). Superior segment right lower lobe 0.9 cm solid pulmonary nodule (series 5/image 72), previously 0.7 cm, mildly increased.  Medial right lower lobe 0.8 cm solid pulmonary nodule (series 5/image 73), which appears new. Upper abdomen: Redemonstration multiple hypodense liver masses, largest 8.9 cm in the lateral segment left liver lobe, not appreciably changed since 05/10/2018 CT abdomen study. Partially visualized right nephrectomy. Subcentimeter hypodense anterior interpolar left renal cortical lesion, too small to characterize. Cystic 2.1 cm uncinate process pancreatic lesion (series 2/image 160). Musculoskeletal: No aggressive appearing focal osseous lesions. Mild thoracic spondylosis. Stable post thoracotomy changes in the right mid ribs. IMPRESSION: 1. Innumerable solid pulmonary nodules scattered throughout both lungs. Medial right lower lobe 0.8 cm solid pulmonary nodule is new since 2016 chest CT. Separate 0.9 cm superior segment right lower lobe solid pulmonary nodule is mildly increased in size since 2016 chest CT. These two pulmonary nodules are indeterminate, with pulmonary metastatic disease not excluded. These pulmonary nodules are at the lower limits of PET resolution. Suggest follow-up chest CT in 3 months. All other pulmonary nodules appear stable since 2016 chest CT and are presumably benign. 2. No thoracic adenopathy. 3. Redemonstration of multiple low-attenuation liver masses and cystic uncinate process pancreatic mass, please see the 05/10/2018 CT abdomen report for further details and management recommendations. 4. Stable dilated main pulmonary artery, suggesting chronic pulmonary arterial hypertension. 5. Left main and 3 vessel coronary atherosclerosis. Aortic Atherosclerosis (ICD10-I70.0) and Emphysema (ICD10-J43.9). Electronically Signed   By: Ilona Sorrel M.D.   On: 05/23/2018 12:49   Ct Abdomen Pelvis W Contrast  Result Date: 05/10/2018 CLINICAL DATA:  Abdominal pain with nausea and vomiting. Remote history of breast cancer, renal cell carcinoma and carcinoid tumor of the lung. EXAM: CT ABDOMEN AND PELVIS  WITH CONTRAST TECHNIQUE: Multidetector CT imaging of the abdomen and pelvis was performed using the standard protocol following bolus administration of intravenous contrast. CONTRAST:  78mL OMNIPAQUE IOHEXOL 300 MG/ML  SOLN COMPARISON:  Abdominopelvic CT 05/20/2011.  Chest CT 05/15/2014. FINDINGS: Lower chest: Numerous nodules are again noted at both lung bases, measuring up to 9 mm in the right lower lobe on image 2/5. These have not significantly changed from 2016, although there are a few in the left lower lobe which may be minimally larger from 2013. There are postsurgical changes at the right lung base. No significant pleural or pericardial effusion. Coronary artery atherosclerosis noted. Hepatobiliary: There is a new large, heterogeneously enhancing and partially exophytic mass in the left hepatic lobe, measuring up to 9.2 x 6.8 cm on image 20/3. This extends 9.0 cm on coronal image 47/6. Superior to this lesion, there is a stable 2.6 x 1.7 cm lesion on image 9/3, previously characterized as a hemangioma. However, several other new hepatic lesions are present (at least 4), measuring up to 1.5 cm inferiorly in the right lobe on images 22 and 26 of series 3. No morphologic changes of cirrhosis. No significant biliary dilatation post cholecystectomy. Pancreas: There is a new low-density mass in the uncinate process of the pancreas, measuring 2.0 x 1.4 cm  on image 23/3. This demonstrates no definite solid components or enhancement and appears cystic. There is no pancreatic ductal dilatation or surrounding inflammation. Spleen: Normal in size without focal abnormality. Adrenals/Urinary Tract: Both adrenal glands appear normal. There is no mass in the right nephrectomy bed. Small left renal cysts have mildly enlarged, although there are no suspicious renal masses or hydronephrosis. Aside from pelvic floor laxity, the bladder appears normal. Stomach/Bowel: The stomach and small bowel appear normal. There is a  possible low-density mass within the cecal lumen near the ileocecal valve, measuring 5.4 x 4.6 cm on image 41/3. No resulting bowel obstruction. Diverticular changes are present within the descending and sigmoid colon without wall thickening or surrounding inflammation. Vascular/Lymphatic: There are no enlarged abdominal or pelvic lymph nodes. No acute vascular findings. There is diffuse aortic and branch vessel atherosclerosis. The IVC and left renal vein appear normal. Reproductive: Stable mild lobularity of the uterus consistent with fibroids. Stable mild prominence of the left ovary. No suspicious adnexal findings. Pelvic floor laxity noted. Other: No ascites or peritoneal nodularity. Musculoskeletal: No acute or significant osseous findings. Lumbar spondylosis with associated mild convex right scoliosis. Previous left hip pinning. IMPRESSION: 1. Multifocal hepatic metastatic disease with dominant mass in the left hepatic lobe. There are no underlying morphologic changes of cirrhosis. Tissue sampling recommended. 2. Possible new low-density mass in the cecal lumen near the ileocecal valve. This could be neoplastic or reflect a mucocele or stool ball. No bowel obstruction. 3. Multiple nodules at both lung bases have been present on multiple prior studies and do not appear significantly progressive from 2016. 4. New low-density lesion in the uncinate process of the pancreas demonstrates no aggressive characteristics. Although this could reflect a small indolent cystic neoplasm, this is unlikely to account for the metastatic disease. 5. Additional incidental findings including distal colonic diverticulosis, pelvic floor laxity and Aortic Atherosclerosis (ICD10-I70.0). 6. These results will be called to the ordering clinician or representative by the Radiologist Assistant, and communication documented in the PACS or zVision Dashboard. Electronically Signed   By: Richardean Sale M.D.   On: 05/10/2018 10:24   US  Biopsy (liver)  Result Date: 05/25/2018 INDICATION: 73 year old with a liver mass.  Tissue diagnosis is needed. EXAM: ULTRASOUND-GUIDED CORE BIOPSY OF LIVER MASS MEDICATIONS: None. ANESTHESIA/SEDATION: Moderate (conscious) sedation was employed during this procedure. A total of Versed 1.0 mg and Fentanyl 25 mcg was administered intravenously. Moderate Sedation Time: 17 minutes. The patient's level of consciousness and vital signs were monitored continuously by radiology nursing throughout the procedure under my direct supervision. FLUOROSCOPY TIME:  None COMPLICATIONS: None immediate. PROCEDURE: Informed written consent was obtained from the patient after a thorough discussion of the procedural risks, benefits and alternatives. All questions were addressed. A timeout was performed prior to the initiation of the procedure. Liver was evaluated with ultrasound. A large heterogeneous lesion involving the left hepatic lobe was targeted for biopsy. Anterior abdomen was prepped with chlorhexidine and sterile field was created. Skin and soft tissues were anesthetized with 1% lidocaine. Using ultrasound guidance, 17 gauge coaxial needle was directed into the left hepatic lesion while traversing a small amount of normal liver parenchyma. Needle position was confirmed within the lesion. Four core biopsies were obtained with an 18 gauge core device. Specimens placed in formalin. 17 gauge coaxial needle was removed without complication. Bandage placed over the puncture site. FINDINGS: Large heterogeneous mass in the left hepatic lobe. Needle position confirmed within the lesion. No significant bleeding or hematoma  formation following the core biopsies. At least 2 small hypoechoic lesions in the right hepatic lobe. IMPRESSION: Ultrasound-guided core biopsy of the left hepatic mass. Electronically Signed   By: Markus Daft M.D.   On: 05/25/2018 15:07    Assessment and Plan:    73 year old woman with the following:  1.   Metastatic carcinoma with hepatic involvement that is biopsy-proven on 05/25/2018.  The biopsy showed papillary renal cell carcinoma consistent with her original resected right kidney in November 2010.  Staging work-up showed bilateral pulmonary nodules although some are stable from 2016 but some are new and possibly enlarging.  She has also multiple hepatic lesions as well as a dominant large mass.  The natural course of this disease was discussed today with the patient and her family via electronic telecommunication.  She has developed rather large tumor in the liver that is not resectable at this time with potentially satellite lesions as well as multiple pulmonary involvement.  This represents an incurable malignancy with the treatment goal is palliative.  Treatment options in general were discussed which include systemic therapy which will be in the form of oral targeted therapy for VGEF, immune therapy or supportive care only.  Risks and benefits of all these approaches were discussed today in detail.  The specifics of each category were also reviewed.  Oral targeted therapy could be in the form of sunitinib or cabozantinib, immune therapy would be single agent nivolumab or Pembrolizumab or possibly combination.  Supportive care only with me in symptom management approach and potentially hospice.  She is not a candidate for combined immunotherapy given her overall frail status and marginal performance status.  Oral targeted therapy might exacerbate her GI complaints but could potentially have a high response rate that would palliate her disease for a period of time.  Single agent Pembrolizumab or nivolumab would be reasonably tolerable but am not quite sure how much palliation of symptoms she would benefit.  After discussion today, she will evaluate these options.  She is interested in seeking another opinion at [facility] which I encouraged her to do so.  She will let me know about  her decision in the future.  2.  Prognosis: This was discussed today in detail. She has an incurable malignancy with disease that cannot be cured but can be potentially palliated.  Her life expectancy I believe is limited because of limitation in her overall health and performance status.  I feel without treatment she could be potentially looking at single or double-digit months at most.  Therapy might offer further palliation and extending her overall survival.  3. Follow-up: Will be determined depending on her decision how to proceed with treatment.  60  minutes was spent with the patient face-to-face today.  More than 50% of time was dedicated to reviewing her disease status, imaging studies, treatment options and answering questions regarding diagnosis and prognosis.    Thank you for the referral. A copy of this consult has been forwarded to the requesting physician.

## 2018-06-07 ENCOUNTER — Telehealth: Payer: Self-pay

## 2018-06-07 NOTE — Telephone Encounter (Signed)
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   Provider: Dr. Iona Beard or Dr. Roosevelt Locks or next available.   Phone number: [phone]  Thanks.

## 2018-06-07 NOTE — Telephone Encounter (Signed)
Received return call from [address] with [place] center that the patient has a new patient appointment scheduled on 4/6 at 1pm with Dr. Bufford Spikes and will be a virtual visit.

## 2018-06-07 NOTE — Telephone Encounter (Signed)
Contacted [facility] for referral. Spoke with Malachy Mood and faxed the requested documents to [phone] and received confirmation. Malachy Mood stated that she will forward the documents to the requested MD's to see if the referral is appropriate at this time. Malachy Mood stated that she will follow up with this RN once the decision is made.

## 2018-06-12 DIAGNOSIS — C649 Malignant neoplasm of unspecified kidney, except renal pelvis: Secondary | ICD-10-CM | POA: Diagnosis not present

## 2018-06-12 DIAGNOSIS — C787 Secondary malignant neoplasm of liver and intrahepatic bile duct: Secondary | ICD-10-CM | POA: Diagnosis not present

## 2018-06-13 ENCOUNTER — Encounter: Payer: Self-pay | Admitting: Oncology

## 2018-06-14 ENCOUNTER — Telehealth: Payer: Self-pay | Admitting: *Deleted

## 2018-06-14 ENCOUNTER — Telehealth: Payer: Self-pay

## 2018-06-14 ENCOUNTER — Other Ambulatory Visit: Payer: Self-pay | Admitting: Oncology

## 2018-06-14 ENCOUNTER — Telehealth: Payer: Self-pay | Admitting: Pharmacist

## 2018-06-14 MED ORDER — SUNITINIB MALATE 12.5 MG PO CAPS: ORAL_CAPSULE | ORAL | 0 refills | Status: DC

## 2018-06-14 NOTE — Telephone Encounter (Signed)
Oral Oncology Pharmacist Encounter  Received new prescription for Sutent (sunitinib) for the treatment of metastatic papillary renal cell carcinoma, planned duration until disease progression or unacceptable toxicity.  Original diagnosis in 2010, mass was resected. Biopsy proven recurrence was noted in March 2020 with sites of metastases to the liver and lungs. Patient in now under evaluation to initiate treatment with Sutent at 37.5mg  by mouth once daily for 2 weeks on, 1 week off, and repeated  Labs from Epic assessed, OK for Sutent initiation. BPs reviewed, most readings WNL, will continue to be monitored during treatemnt  05/09/18 SCr=1.13, est CrCl ~ 40 mL/min, no dose adjustments recommended by manufacturer with CrCl > 30 mL/min Noted Sutent dose is already reduced for increased toleration  No recent urinalysis, urine protein will be monitored periodically during treatment  No baseline measure of LVEF, no PMH of heat failure, ECHO may be performed in the future with signs or symptoms of cardiac dysfunction  EKG performed 04/06/2018 shows QTc 421 msec, safe for Sutent initiation  Current medication list in Epic reviewed, no significant DDIs with Sutent identified.   Prescription has been e-scribed to the [facility] for benefits analysis and approval by MD.  Oral Oncology Clinic will continue to follow for insurance authorization, copayment issues, initial counseling and start date.  Johny Drilling, PharmD, BCPS, BCOP  06/14/2018 11:18 AM Oral Oncology Clinic [phone]

## 2018-06-14 NOTE — Progress Notes (Signed)
I contacted the patient via phone today for an update.  She had an evaluation at [facility] virtually and it was recommended that she starts Sutent.  Risks and benefits of this medication was discussed today over the phone.  Complications and include nausea, fatigue, hypotension, anorexia and weight loss were reiterated.  Alternative therapy were also discussed which include hospice.  At this time she would like to try this medication and will evaluate her in 3 to 4 weeks after treatment for possible complications.  We will set up home health care for physical therapy and monthly labs.  She will start at 37.5 mg 2 weeks on 1 week off will adjust accordingly.

## 2018-06-14 NOTE — Telephone Encounter (Signed)
Oral Oncology Patient Advocate Encounter  Received notification from [employer] FEP that prior authorization for Sutent is required.  PA submitted on CoverMyMeds Key [id number] Status is pending  Oral Oncology Clinic will continue to follow.  [place] Patient [place] Phone [phone] Fax [phone] 06/14/2018    12:15 PM

## 2018-06-14 NOTE — Telephone Encounter (Signed)
Oral Oncology Patient Advocate Encounter  Prior Authorization for Sutent has been approved.    PA# [id number] Effective dates: 05/15/18 through 06/14/19  Oral Oncology Clinic will continue to follow.   [place] Patient [place] Phone [phone] Fax [phone] 06/14/2018    1:11 PM

## 2018-06-14 NOTE — Telephone Encounter (Signed)
[place] RN from [facility] notified of need for [facility] PT and CBC/CMET monthly

## 2018-06-15 ENCOUNTER — Other Ambulatory Visit: Payer: Self-pay | Admitting: Oncology

## 2018-06-15 DIAGNOSIS — C7801 Secondary malignant neoplasm of right lung: Secondary | ICD-10-CM | POA: Diagnosis not present

## 2018-06-15 DIAGNOSIS — Z905 Acquired absence of kidney: Secondary | ICD-10-CM | POA: Diagnosis not present

## 2018-06-15 DIAGNOSIS — Z7951 Long term (current) use of inhaled steroids: Secondary | ICD-10-CM | POA: Diagnosis not present

## 2018-06-15 DIAGNOSIS — C787 Secondary malignant neoplasm of liver and intrahepatic bile duct: Secondary | ICD-10-CM | POA: Diagnosis not present

## 2018-06-15 DIAGNOSIS — Z87891 Personal history of nicotine dependence: Secondary | ICD-10-CM | POA: Diagnosis not present

## 2018-06-15 DIAGNOSIS — Z9181 History of falling: Secondary | ICD-10-CM | POA: Diagnosis not present

## 2018-06-15 DIAGNOSIS — Z85528 Personal history of other malignant neoplasm of kidney: Secondary | ICD-10-CM | POA: Diagnosis not present

## 2018-06-15 DIAGNOSIS — C7802 Secondary malignant neoplasm of left lung: Secondary | ICD-10-CM | POA: Diagnosis not present

## 2018-06-15 DIAGNOSIS — J449 Chronic obstructive pulmonary disease, unspecified: Secondary | ICD-10-CM | POA: Diagnosis not present

## 2018-06-15 DIAGNOSIS — Z853 Personal history of malignant neoplasm of breast: Secondary | ICD-10-CM | POA: Diagnosis not present

## 2018-06-15 DIAGNOSIS — Z7984 Long term (current) use of oral hypoglycemic drugs: Secondary | ICD-10-CM | POA: Diagnosis not present

## 2018-06-15 DIAGNOSIS — E1140 Type 2 diabetes mellitus with diabetic neuropathy, unspecified: Secondary | ICD-10-CM | POA: Diagnosis not present

## 2018-06-15 DIAGNOSIS — Z9981 Dependence on supplemental oxygen: Secondary | ICD-10-CM | POA: Diagnosis not present

## 2018-06-15 MED ORDER — DIPHENOXYLATE-ATROPINE 2.5-0.025 MG PO TABS: 1.0000 | ORAL_TABLET | Freq: Four times a day (QID) | ORAL | 0 refills | Status: AC | PRN

## 2018-06-15 NOTE — Telephone Encounter (Signed)
Oral Chemotherapy Pharmacist Encounter   I spoke with patient's husband, [name], for overview of: Sutent (sunitinib) for the treatment of metastatic papillary renal cell carcinoma, planned duration until disease progression or unacceptable toxicity.   Counseled on administration, dosing, side effects, monitoring, drug-food interactions, safe handling, storage, and disposal.  Patient will take Sutent 12.5mg  capsules, 3 capsules (37.5mg ) by mouth once daily with or without food.  Sutent will be administered 2 weeks on, 1 weeks off, repeat every 3 weeks.  Alvester Chou informed patient should aviod grapefruit and grapefruit juice while on Sutent.  Sutent start date: 06/21/2018  Adverse effects include but are not limited to: fatigue, diarrhea, nausea, vomiting, mouth sores, rash, hand-foot syndrome, bleeding events, and altered cardiac function.  Patient has anti-emetic on hand and will take it if nausea develops.   They are managing symptoms of diarrhea already. It is not controlled by loperamide or pepto-bismol. Message sent to MD for anti-diarrheal medication.  Alvester Chou informed Sutent should be temporarily interrupted for major surgical procedures and can be restarted upon recovery from surgery.     Reviewed importance of keeping a medication schedule and plan for any missed doses.  Alvester Chou voiced understanding and appreciation.  He had multiple questions about efficacy of medication and office experience with this treatment.  All questions answered. Medication reconciliation performed and medication/allergy list updated.  Insurance authorization for Sutent has been obtained. Test claim at the pharmacy revealed copayment ~$4000 for 1st fill of Sutent (6 week supply). Copayment will be covered with manufacturer copayment coupon, out of pocket expense for Sutent is $0. This will ship from the [facility] on 06/19/2018 to deliver to patient's home on 06/20/2018.  Subsequent fills  must be filled at [facility] in [place], [place] with [employer]  They know to call the office with questions or concerns. Oral Oncology Clinic will continue to follow.  Johny Drilling, PharmD, BCPS, BCOP  06/15/2018   1:43 PM Oral Oncology Clinic [phone]

## 2018-06-16 DIAGNOSIS — J449 Chronic obstructive pulmonary disease, unspecified: Secondary | ICD-10-CM | POA: Diagnosis not present

## 2018-06-16 DIAGNOSIS — Z85528 Personal history of other malignant neoplasm of kidney: Secondary | ICD-10-CM | POA: Diagnosis not present

## 2018-06-16 DIAGNOSIS — E1140 Type 2 diabetes mellitus with diabetic neuropathy, unspecified: Secondary | ICD-10-CM | POA: Diagnosis not present

## 2018-06-16 DIAGNOSIS — C7802 Secondary malignant neoplasm of left lung: Secondary | ICD-10-CM | POA: Diagnosis not present

## 2018-06-16 DIAGNOSIS — C787 Secondary malignant neoplasm of liver and intrahepatic bile duct: Secondary | ICD-10-CM | POA: Diagnosis not present

## 2018-06-16 DIAGNOSIS — C7801 Secondary malignant neoplasm of right lung: Secondary | ICD-10-CM | POA: Diagnosis not present

## 2018-06-19 ENCOUNTER — Telehealth: Payer: Self-pay

## 2018-06-19 ENCOUNTER — Telehealth: Payer: Self-pay | Admitting: *Deleted

## 2018-06-19 MED FILL — SUTENT 12.5 MG CAPSULE: 12.5 | 21 days supply | Qty: 42 | Fill #0

## 2018-06-19 NOTE — Telephone Encounter (Signed)
Oral Oncology Patient Advocate Encounter  Sutent copay is [phone], this is not affordable for the patient. I was able to secure a copay card and this will make her out of pocket cost $0. The copay card information is as follows and has been shared with [place].  BNL:[id number] Grp: [id number] ID: [id number] Exp. 03/08/19  I will call [facility] will the copay card information when we send her refill there in May.  [place] Patient [place] Phone [phone] Fax [phone] 06/19/2018   3:12 PM

## 2018-06-19 NOTE — Telephone Encounter (Signed)
Oral Oncology Patient Advocate Encounter  Confirmed with [place] that Sutent was shipped on 06/19/18 with a $0 copay using copay card.   [place] Patient [place] Phone [phone] Fax [phone] 06/19/2018   3:16 PM

## 2018-06-19 NOTE — Telephone Encounter (Signed)
okay

## 2018-06-19 NOTE — Telephone Encounter (Signed)
Oral Oncology Pharmacist Encounter  Sutent prescription will shipped from the [facility] long outpatient pharmacy today for delivery to patient's home tomorrow.  This is a one-time fill from local pharmacy, subsequent prescriptions must ship from [facility] in [place], [place], per insurance requirement. Copayment will be covered by manufacturer copayment coupon. Patients with FEP insurance are eligible to use manufacturer co-pay coupons. We will ensure that [facility] receives co-pay coupon billing information for subsequent fills of patient's Sutent.  Johny Drilling, PharmD, BCPS, BCOP  06/19/2018 10:01 AM Oral Oncology Clinic [phone]

## 2018-06-19 NOTE — Telephone Encounter (Signed)
[place] physical therapist with [facility] called requesting verbal order for PT, Pankti stated that an evaluation was done last week One time for one week 2 times for 3 weeks

## 2018-06-19 NOTE — Telephone Encounter (Signed)
Verbal order given to P[place]ti

## 2018-06-20 ENCOUNTER — Other Ambulatory Visit: Payer: Medicare Other

## 2018-06-22 ENCOUNTER — Telehealth: Payer: Self-pay | Admitting: Internal Medicine

## 2018-06-22 DIAGNOSIS — Z85528 Personal history of other malignant neoplasm of kidney: Secondary | ICD-10-CM | POA: Diagnosis not present

## 2018-06-22 DIAGNOSIS — J449 Chronic obstructive pulmonary disease, unspecified: Secondary | ICD-10-CM | POA: Diagnosis not present

## 2018-06-22 DIAGNOSIS — C7801 Secondary malignant neoplasm of right lung: Secondary | ICD-10-CM | POA: Diagnosis not present

## 2018-06-22 DIAGNOSIS — E1140 Type 2 diabetes mellitus with diabetic neuropathy, unspecified: Secondary | ICD-10-CM | POA: Diagnosis not present

## 2018-06-22 DIAGNOSIS — C787 Secondary malignant neoplasm of liver and intrahepatic bile duct: Secondary | ICD-10-CM | POA: Diagnosis not present

## 2018-06-22 DIAGNOSIS — C7802 Secondary malignant neoplasm of left lung: Secondary | ICD-10-CM | POA: Diagnosis not present

## 2018-06-22 NOTE — Telephone Encounter (Signed)
Melinda from [facility] seen the patient today Patient has has Edema .Today she is having some weeping. She has order for LASIX but has not been taking it. Patient did take one today while Rip Harbour was there. She would like to see if there is anything else the doctor would like for the patient to do    [facility] PHONE- [phone]

## 2018-06-22 NOTE — Telephone Encounter (Signed)
Spoke to Kuna. She suggested I call Mr Gillyard. He said they could use an iPad for a VV. Set her up for tomorrow.

## 2018-06-22 NOTE — Telephone Encounter (Signed)
This is a tough call since I haven't seen her in a while and has recent diagnosis of metastatic renal cell carcinoma. If she has furosemide, she can take it Might want to set up virtual visit tomorrow so I can review her status (may need in person visit at some point)

## 2018-06-23 ENCOUNTER — Ambulatory Visit
Admission: RE | Admit: 2018-06-23 | Discharge: 2018-06-23 | Disposition: A | Discharge status: Home / Self Care | Payer: Medicare Other | Source: Ambulatory Visit | Attending: Internal Medicine | Admitting: Internal Medicine

## 2018-06-23 ENCOUNTER — Encounter: Payer: Self-pay | Admitting: Internal Medicine

## 2018-06-23 ENCOUNTER — Telehealth: Payer: Self-pay | Admitting: *Deleted

## 2018-06-23 ENCOUNTER — Other Ambulatory Visit: Payer: Self-pay

## 2018-06-23 ENCOUNTER — Ambulatory Visit (INDEPENDENT_AMBULATORY_CARE_PROVIDER_SITE_OTHER): Payer: Medicare Other | Admitting: Internal Medicine

## 2018-06-23 VITALS — BP 118/70

## 2018-06-23 DIAGNOSIS — M7989 Other specified soft tissue disorders: Secondary | ICD-10-CM | POA: Insufficient documentation

## 2018-06-23 DIAGNOSIS — C649 Malignant neoplasm of unspecified kidney, except renal pelvis: Secondary | ICD-10-CM | POA: Diagnosis not present

## 2018-06-23 DIAGNOSIS — N179 Acute kidney failure, unspecified: Secondary | ICD-10-CM | POA: Diagnosis not present

## 2018-06-23 DIAGNOSIS — R2241 Localized swelling, mass and lump, right lower limb: Secondary | ICD-10-CM | POA: Diagnosis not present

## 2018-06-23 DIAGNOSIS — J9621 Acute and chronic respiratory failure with hypoxia: Secondary | ICD-10-CM | POA: Diagnosis not present

## 2018-06-23 DIAGNOSIS — Z20828 Contact with and (suspected) exposure to other viral communicable diseases: Secondary | ICD-10-CM | POA: Diagnosis not present

## 2018-06-23 DIAGNOSIS — J440 Chronic obstructive pulmonary disease with acute lower respiratory infection: Secondary | ICD-10-CM | POA: Diagnosis not present

## 2018-06-23 DIAGNOSIS — A419 Sepsis, unspecified organism: Secondary | ICD-10-CM | POA: Diagnosis not present

## 2018-06-23 DIAGNOSIS — R0602 Shortness of breath: Secondary | ICD-10-CM | POA: Diagnosis not present

## 2018-06-23 DIAGNOSIS — R0902 Hypoxemia: Secondary | ICD-10-CM | POA: Diagnosis not present

## 2018-06-23 DIAGNOSIS — J189 Pneumonia, unspecified organism: Secondary | ICD-10-CM | POA: Diagnosis not present

## 2018-06-23 MED ORDER — FUROSEMIDE 20 MG PO TABS: 20.0000 mg | ORAL_TABLET | Freq: Every day | ORAL | 5 refills | Status: AC | PRN

## 2018-06-23 NOTE — Assessment & Plan Note (Signed)
Has started sunitinib

## 2018-06-23 NOTE — Telephone Encounter (Signed)
Kim called from [facility] to do a call report on a doppler that was done. [name] stated that the test was negative for DVT, but there is quite a bit of swelling. [name] stated that the report is in [place]. [name] stated that the patient did not want to wait and went home because she was feeling so bad. [name] stated that she told the patient that the office will be in touch with her.

## 2018-06-23 NOTE — Progress Notes (Signed)
Subjective:    Patient ID: [name], female    DOB: 06-May-1945, 73 y.o.   MRN: [id number]  HPI Virtual visit due to leg swelling below right knee Identification done Reviewed billing and consent given She and husband are in her home, I am in my office  Swelling started at least a month ago Larger than normal Was especially bad yesterday----but better this morning Did give several doses of the furosemide--but held for a few days (then gave it again yesterday)  No known injury to leg No chest pain No SOB Leg is not painful No ulcers on the leg now (currently healing up)  Current Outpatient Medications on File Prior to Visit  Medication Sig Dispense Refill  . albuterol (PROVENTIL HFA;VENTOLIN HFA) 108 (90 Base) MCG/ACT inhaler Inhale 2 puffs into the lungs every 6 (six) hours as needed for wheezing or shortness of breath.     Marland Kitchen albuterol (PROVENTIL) (2.5 MG/3ML) 0.083% nebulizer solution Take 3 mLs (2.5 mg total) by nebulization every 6 (six) hours as needed for wheezing or shortness of breath. 150 mL 1  . bismuth subsalicylate (PEPTO BISMOL) 262 MG/15ML suspension Take 30 mLs by mouth every 6 (six) hours as needed for indigestion.    . cholecalciferol (VITAMIN D3) 25 MCG (1000 UT) tablet Take 1,000 Units by mouth daily.    . diphenoxylate-atropine (LOMOTIL) 2.5-0.025 MG tablet Take 1 tablet by mouth 4 (four) times daily as needed for diarrhea or loose stools. 30 tablet 0  . fexofenadine (ALLEGRA) 180 MG tablet Take 180 mg by mouth daily as needed (nasal drainage).    . fluticasone (FLONASE) 50 MCG/ACT nasal spray USE 2 SPRAYS NASALLY DAILY (Patient taking differently: Place 2 sprays into both nostrils daily. ) 48 g 3  . furosemide (LASIX) 20 MG tablet Take 20 mg by mouth daily as needed.    . metFORMIN (GLUCOPHAGE) 500 MG tablet TAKE 1 TABLET TWICE A DAY  WITH MEALS (Patient taking differently: Take 500 mg by mouth 2 (two) times daily. ) 180 tablet 0  . mometasone-formoterol  (DULERA) 200-5 MCG/ACT AERO Take 2 puffs first thing in am and then another 2 puffs about 12 hours later. (Patient taking differently: Inhale 2 puffs into the lungs 2 (two) times daily. Take 2 puffs first thing in am and then another 2 puffs about 12 hours later.) 3 Inhaler 3  . Omega-3 Fatty Acids (FISH OIL) 1200 MG CAPS Take 1,200 mg by mouth daily.     . ondansetron (ZOFRAN) 4 MG tablet Take 1 tablet (4 mg total) by mouth every 6 (six) hours as needed for nausea or vomiting. 60 tablet 0  . pravastatin (PRAVACHOL) 40 MG tablet TAKE 1 TABLET DAILY (Patient taking differently: Take 40 mg by mouth daily. ) 90 tablet 0  . pregabalin (LYRICA) 75 MG capsule Take 1 capsule (75 mg total) by mouth 3 (three) times daily. (Patient taking differently: Take 75 mg by mouth 2 (two) times daily. ) 270 capsule 3  . SUNItinib (SUTENT) 12.5 MG capsule Take three caps daily for 2 weeks, one week off. 90 capsule 0  . tiotropium (SPIRIVA HANDIHALER) 18 MCG inhalation capsule INHALE THE CONTENTS OF 1   CAPSULE VIA HANDIHALER     DAILY (Patient taking differently: Place 18 mcg into inhaler and inhale daily. INHALE THE CONTENTS OF 1   CAPSULE VIA HANDIHALER     DAILY) 90 capsule 3   No current facility-administered medications on file prior to visit.  Allergies  Allergen Reactions  . Budesonide-Formoterol Fumarate Other (See Comments)    (Symbicort) coughed worse  . Lipitor [Atorvastatin] Other (See Comments)    Nosebleeds  . Penicillins Rash    Did it involve swelling of the face/tongue/throat, SOB, or low BP? No Did it involve sudden or severe rash/hives, skin peeling, or any reaction on the inside of your mouth or nose? No Did you need to seek medical attention at a hospital or doctor's office? No When did it last happen?childhood allergy If all above answers are "NO", may proceed with cephalosporin use.     Past Medical History:  Diagnosis Date  . Allergy   . Anemia   . Anxiety   . Asthma   .  Blood transfusion without reported diagnosis   . Breast cancer [facility])    h/o- radiation  . Carcinoid tumor of lung 1991  . Cataract    removed x 1   . COPD (chronic obstructive pulmonary disease) ([place])   . Depression   . Diabetes mellitus type II   . Diverticulosis   . Dyspnea    with exertion   . Gastroparesis   . GERD (gastroesophageal reflux disease)   . History of adenomatous polyp of colon   . History of breast cancer   . Hyperlipidemia   . Mild cognitive impairment   . Neuromuscular disorder (HCC)    neuropathy   . Osteoarthritis   . Oxygen deficiency    2 liters at bedtime   . Pneumonia   . Pulmonary nodule    multiple  . Renal cell carcinoma     Past Surgical History:  Procedure Laterality Date  . BIOPSY  05/16/2018   Procedure: BIOPSY;  Surgeon: Rush Landmark Telford Nab., MD;  Location: [facility];  Service: Gastroenterology;;  . BREAST LUMPECTOMY  1998   right breast  . CHOLECYSTECTOMY  1975  . COLONOSCOPY    . COLONOSCOPY WITH PROPOFOL N/A 05/16/2018   Procedure: COLONOSCOPY WITH PROPOFOL;  Surgeon: Rush Landmark Telford Nab., MD;  Location: [facility];  Service: Gastroenterology;  Laterality: N/A;  . HEMOSTASIS CLIP PLACEMENT  05/16/2018   Procedure: HEMOSTASIS CLIP PLACEMENT;  Surgeon: Irving Copas., MD;  Location: [facility];  Service: Gastroenterology;;  . HIP FRACTURE SURGERY Left 03/2018  . HIP PINNING,CANNULATED Left 04/06/2018   Procedure: CANNULATED SCREWS/HIP PINNING LEFT HIP;  Surgeon: Mcarthur Rossetti, MD;  Location: [facility];  Service: Orthopedics;  Laterality: Left;  . LAPAROSCOPIC NEPHRECTOMY  01/2009   right  . LUNG SURGERY  1994   carcinoid removal  . POLYPECTOMY    . POLYPECTOMY  05/16/2018   Procedure: POLYPECTOMY;  Surgeon: Mansouraty, Telford Nab., MD;  Location: [facility];  Service: Gastroenterology;;  . TONSILLECTOMY AND ADENOIDECTOMY  1953    Family History  Problem Relation Age of Onset  . Colon cancer Father   .  Lung cancer Father   . Stroke Mother   . Breast cancer Neg Hx   . Ovarian cancer Neg Hx   . Diabetes Neg Hx   . Colon polyps Neg Hx   . Esophageal cancer Neg Hx   . Rectal cancer Neg Hx   . Stomach cancer Neg Hx     Social History   Socioeconomic History  . Marital status: Married    Spouse name: Not on file  . Number of children: 2  . Years of education: Not on file  . Highest education level: Not on file  Occupational History  . Occupation: retired  Banking    Employer: RETIRED  Social Needs  . Financial resource strain: Not on file  . Food insecurity:    Worry: Not on file    Inability: Not on file  . Transportation needs:    Medical: Not on file    Non-medical: Not on file  Tobacco Use  . Smoking status: Former Smoker    Packs/day: 1.00    Years: 30.00    Pack years: 30.00    Types: Cigarettes    Last attempt to quit: 03/08/1993    Years since quitting: 25.3  . Smokeless tobacco: Never Used  Substance and Sexual Activity  . Alcohol use: No  . Drug use: No  . Sexual activity: Not Currently    Birth control/protection: Post-menopausal  Lifestyle  . Physical activity:    Days per week: Not on file    Minutes per session: Not on file  . Stress: Not on file  Relationships  . Social connections:    Talks on phone: Not on file    Gets together: Not on file    Attends religious service: Not on file    Active member of club or organization: Not on file    Attends meetings of clubs or organizations: Not on file    Relationship status: Not on file  . Intimate partner violence:    Fear of current or ex partner: Not on file    Emotionally abused: Not on file    Physically abused: Not on file    Forced sexual activity: Not on file  Other Topics Concern  . Not on file  Social History Narrative   Has living will   Requests husband as health care POA--daughter [name] would be alternate   Now has DNR-- done 2/19   No tube feeds if cognitively unaware          Review of Systems Did have second opinion from [facility]---has started oral medication (Sutent) Not eating well Still puts salt on her food---discussed stopping this    Objective:   Physical Exam  Constitutional: She appears well-developed. No distress.  Respiratory: Effort normal. No respiratory distress.  Musculoskeletal:     Comments: Focal swelling along the gastroc on right  No pedal edema Mild tenderness in the swollen area Some stasis changes in both legs Small ulcer which is granulated on lateral mid right calf           Assessment & Plan:

## 2018-06-23 NOTE — Telephone Encounter (Signed)
Was able to make contact with patient and speak with her directly.  Results were given (negative for DVT).  Patient is to start back on the furosemide per Dr. Everardo Beals instructions and I asked her to please call and follow up with [place] next week if things were not improving.   Patient verbalizes understanding and states they will go pick up and start the furosemide.   FYI to Dr. Silvio Pate.

## 2018-06-23 NOTE — Assessment & Plan Note (Addendum)
Given her current cancer diagnosis, a DVT is a distinct possibility Will set up stat venous study If positive, will treat with apixaban 10mg  bid for 1 week, then 5mg  bid for at least 6 months after that Will renew the furosemide as it seemed to help Also discussed stopping adding salt

## 2018-06-23 NOTE — Telephone Encounter (Signed)
LM on patient's cell voice mail as no machine at home that we are trying to reach her.  It is after hours so patient will not be able to call office back.  I will attempt to contact patient again before leaving this pm.

## 2018-06-24 NOTE — Telephone Encounter (Signed)
Please check on her on Monday

## 2018-06-25 ENCOUNTER — Emergency Department: Payer: Medicare Other

## 2018-06-25 ENCOUNTER — Encounter: Payer: Self-pay | Admitting: Oncology

## 2018-06-25 ENCOUNTER — Other Ambulatory Visit: Payer: Self-pay

## 2018-06-25 ENCOUNTER — Inpatient Hospital Stay
Admission: EM | Admit: 2018-06-25 | Discharge: 2018-06-28 | DRG: 871 | Disposition: A | Discharge status: Hospice | Payer: Medicare Other | Attending: Internal Medicine | Admitting: Internal Medicine

## 2018-06-25 DIAGNOSIS — I700 Atherosclerosis of aorta: Secondary | ICD-10-CM | POA: Diagnosis present

## 2018-06-25 DIAGNOSIS — Z85528 Personal history of other malignant neoplasm of kidney: Secondary | ICD-10-CM

## 2018-06-25 DIAGNOSIS — Z7951 Long term (current) use of inhaled steroids: Secondary | ICD-10-CM

## 2018-06-25 DIAGNOSIS — J9621 Acute and chronic respiratory failure with hypoxia: Secondary | ICD-10-CM | POA: Diagnosis present

## 2018-06-25 DIAGNOSIS — Z20828 Contact with and (suspected) exposure to other viral communicable diseases: Secondary | ICD-10-CM | POA: Diagnosis present

## 2018-06-25 DIAGNOSIS — E1140 Type 2 diabetes mellitus with diabetic neuropathy, unspecified: Secondary | ICD-10-CM | POA: Diagnosis present

## 2018-06-25 DIAGNOSIS — F419 Anxiety disorder, unspecified: Secondary | ICD-10-CM | POA: Diagnosis present

## 2018-06-25 DIAGNOSIS — Z66 Do not resuscitate: Secondary | ICD-10-CM | POA: Diagnosis present

## 2018-06-25 DIAGNOSIS — F0150 Vascular dementia without behavioral disturbance: Secondary | ICD-10-CM | POA: Diagnosis present

## 2018-06-25 DIAGNOSIS — E876 Hypokalemia: Secondary | ICD-10-CM | POA: Diagnosis present

## 2018-06-25 DIAGNOSIS — I2510 Atherosclerotic heart disease of native coronary artery without angina pectoris: Secondary | ICD-10-CM | POA: Diagnosis present

## 2018-06-25 DIAGNOSIS — R0602 Shortness of breath: Secondary | ICD-10-CM | POA: Diagnosis not present

## 2018-06-25 DIAGNOSIS — Z79899 Other long term (current) drug therapy: Secondary | ICD-10-CM

## 2018-06-25 DIAGNOSIS — Z9981 Dependence on supplemental oxygen: Secondary | ICD-10-CM | POA: Diagnosis not present

## 2018-06-25 DIAGNOSIS — Z801 Family history of malignant neoplasm of trachea, bronchus and lung: Secondary | ICD-10-CM

## 2018-06-25 DIAGNOSIS — R160 Hepatomegaly, not elsewhere classified: Secondary | ICD-10-CM | POA: Diagnosis present

## 2018-06-25 DIAGNOSIS — Z800 Family history of malignant neoplasm of digestive organs: Secondary | ICD-10-CM | POA: Diagnosis not present

## 2018-06-25 DIAGNOSIS — I959 Hypotension, unspecified: Secondary | ICD-10-CM | POA: Diagnosis not present

## 2018-06-25 DIAGNOSIS — J449 Chronic obstructive pulmonary disease, unspecified: Secondary | ICD-10-CM | POA: Diagnosis present

## 2018-06-25 DIAGNOSIS — K219 Gastro-esophageal reflux disease without esophagitis: Secondary | ICD-10-CM | POA: Diagnosis present

## 2018-06-25 DIAGNOSIS — J189 Pneumonia, unspecified organism: Secondary | ICD-10-CM | POA: Diagnosis present

## 2018-06-25 DIAGNOSIS — E1122 Type 2 diabetes mellitus with diabetic chronic kidney disease: Secondary | ICD-10-CM | POA: Diagnosis present

## 2018-06-25 DIAGNOSIS — J309 Allergic rhinitis, unspecified: Secondary | ICD-10-CM | POA: Diagnosis present

## 2018-06-25 DIAGNOSIS — L89151 Pressure ulcer of sacral region, stage 1: Secondary | ICD-10-CM | POA: Diagnosis present

## 2018-06-25 DIAGNOSIS — C649 Malignant neoplasm of unspecified kidney, except renal pelvis: Secondary | ICD-10-CM | POA: Diagnosis present

## 2018-06-25 DIAGNOSIS — Z9221 Personal history of antineoplastic chemotherapy: Secondary | ICD-10-CM

## 2018-06-25 DIAGNOSIS — N183 Chronic kidney disease, stage 3 (moderate): Secondary | ICD-10-CM | POA: Diagnosis present

## 2018-06-25 DIAGNOSIS — A419 Sepsis, unspecified organism: Secondary | ICD-10-CM | POA: Diagnosis present

## 2018-06-25 DIAGNOSIS — Z888 Allergy status to other drugs, medicaments and biological substances status: Secondary | ICD-10-CM

## 2018-06-25 DIAGNOSIS — E785 Hyperlipidemia, unspecified: Secondary | ICD-10-CM | POA: Diagnosis present

## 2018-06-25 DIAGNOSIS — Z923 Personal history of irradiation: Secondary | ICD-10-CM

## 2018-06-25 DIAGNOSIS — R0902 Hypoxemia: Secondary | ICD-10-CM | POA: Diagnosis not present

## 2018-06-25 DIAGNOSIS — Z905 Acquired absence of kidney: Secondary | ICD-10-CM

## 2018-06-25 DIAGNOSIS — Z87891 Personal history of nicotine dependence: Secondary | ICD-10-CM | POA: Diagnosis not present

## 2018-06-25 DIAGNOSIS — J9620 Acute and chronic respiratory failure, unspecified whether with hypoxia or hypercapnia: Secondary | ICD-10-CM | POA: Diagnosis not present

## 2018-06-25 DIAGNOSIS — N189 Chronic kidney disease, unspecified: Secondary | ICD-10-CM

## 2018-06-25 DIAGNOSIS — R918 Other nonspecific abnormal finding of lung field: Secondary | ICD-10-CM | POA: Diagnosis not present

## 2018-06-25 DIAGNOSIS — J440 Chronic obstructive pulmonary disease with acute lower respiratory infection: Secondary | ICD-10-CM | POA: Diagnosis present

## 2018-06-25 DIAGNOSIS — N179 Acute kidney failure, unspecified: Secondary | ICD-10-CM | POA: Diagnosis present

## 2018-06-25 DIAGNOSIS — R6520 Severe sepsis without septic shock: Secondary | ICD-10-CM | POA: Diagnosis present

## 2018-06-25 DIAGNOSIS — J302 Other seasonal allergic rhinitis: Secondary | ICD-10-CM | POA: Diagnosis present

## 2018-06-25 DIAGNOSIS — Z853 Personal history of malignant neoplasm of breast: Secondary | ICD-10-CM

## 2018-06-25 DIAGNOSIS — Z880 Allergy status to penicillin: Secondary | ICD-10-CM

## 2018-06-25 DIAGNOSIS — R069 Unspecified abnormalities of breathing: Secondary | ICD-10-CM | POA: Diagnosis not present

## 2018-06-25 DIAGNOSIS — L8990 Pressure ulcer of unspecified site, unspecified stage: Secondary | ICD-10-CM

## 2018-06-25 DIAGNOSIS — J9611 Chronic respiratory failure with hypoxia: Secondary | ICD-10-CM | POA: Diagnosis present

## 2018-06-25 DIAGNOSIS — Z9049 Acquired absence of other specified parts of digestive tract: Secondary | ICD-10-CM

## 2018-06-25 DIAGNOSIS — Z7984 Long term (current) use of oral hypoglycemic drugs: Secondary | ICD-10-CM

## 2018-06-25 HISTORY — DX: Personal history of malignant neoplasm of other organs and systems: Z85.89

## 2018-06-25 HISTORY — DX: Malignant neoplasm of liver, not specified as primary or secondary: C22.9

## 2018-06-25 HISTORY — DX: Personal history of other malignant neoplasm of kidney: Z85.528

## 2018-06-25 LAB — SARS CORONAVIRUS 2 BY RT PCR (HOSPITAL ORDER, PERFORMED IN ~~LOC~~ HOSPITAL LAB): SARS Coronavirus 2: NEGATIVE

## 2018-06-25 LAB — COMPREHENSIVE METABOLIC PANEL
ALT: 11 U/L (ref 0–44)
AST: 18 U/L (ref 15–41)
Albumin: 2.6 g/dL — ABNORMAL LOW (ref 3.5–5.0)
Alkaline Phosphatase: 72 U/L (ref 38–126)
Anion gap: 13 (ref 5–15)
BUN: 15 mg/dL (ref 8–23)
CO2: 24 mmol/L (ref 22–32)
Calcium: 6.7 mg/dL — ABNORMAL LOW (ref 8.9–10.3)
Chloride: 99 mmol/L (ref 98–111)
Creatinine, Ser: 1.67 mg/dL — ABNORMAL HIGH (ref 0.44–1.00)
GFR calc Af Amer: 35 mL/min — ABNORMAL LOW (ref >60)
GFR calc non Af Amer: 30 mL/min — ABNORMAL LOW (ref >60)
Glucose, Bld: 151 mg/dL — ABNORMAL HIGH (ref 70–99)
Potassium: 3.6 mmol/L (ref 3.5–5.1)
Sodium: 136 mmol/L (ref 135–145)
Total Bilirubin: 0.4 mg/dL (ref 0.3–1.2)
Total Protein: 5.9 g/dL — ABNORMAL LOW (ref 6.5–8.1)

## 2018-06-25 LAB — CBC WITH DIFFERENTIAL/PLATELET
Abs Immature Granulocytes: 0.27 10*3/uL — ABNORMAL HIGH (ref 0.00–0.07)
Basophils Absolute: 0.1 10*3/uL (ref 0.0–0.1)
Basophils Relative: 0 %
Eosinophils Absolute: 0.1 10*3/uL (ref 0.0–0.5)
Eosinophils Relative: 0 %
HCT: 33.2 % — ABNORMAL LOW (ref 36.0–46.0)
Hemoglobin: 9.9 g/dL — ABNORMAL LOW (ref 12.0–15.0)
Immature Granulocytes: 1 %
Lymphocytes Relative: 10 %
Lymphs Abs: 2.6 10*3/uL (ref 0.7–4.0)
MCH: 24.9 pg — ABNORMAL LOW (ref 26.0–34.0)
MCHC: 29.8 g/dL — ABNORMAL LOW (ref 30.0–36.0)
MCV: 83.6 fL (ref 80.0–100.0)
Monocytes Absolute: 0.9 10*3/uL (ref 0.1–1.0)
Monocytes Relative: 3 %
Neutro Abs: 23.8 10*3/uL — ABNORMAL HIGH (ref 1.7–7.7)
Neutrophils Relative %: 86 %
Platelets: 387 10*3/uL (ref 150–400)
RBC: 3.97 MIL/uL (ref 3.87–5.11)
RDW: 15.3 % (ref 11.5–15.5)
Smear Review: NORMAL
WBC: 27.7 10*3/uL — ABNORMAL HIGH (ref 4.0–10.5)
nRBC: 0 % (ref 0.0–0.2)

## 2018-06-25 LAB — LACTIC ACID, PLASMA
Lactic Acid, Venous: 2.7 mmol/L (ref 0.5–1.9)
Lactic Acid, Venous: 4 mmol/L (ref 0.5–1.9)

## 2018-06-25 LAB — PROCALCITONIN: Procalcitonin: 12.48 ng/mL

## 2018-06-25 LAB — GLUCOSE, CAPILLARY: Glucose-Capillary: 115 mg/dL — ABNORMAL HIGH (ref 70–99)

## 2018-06-25 LAB — BRAIN NATRIURETIC PEPTIDE: B Natriuretic Peptide: 150 pg/mL — ABNORMAL HIGH (ref 0.0–100.0)

## 2018-06-25 MED ORDER — ONDANSETRON HCL 4 MG PO TABS: 4.0000 mg | ORAL_TABLET | Freq: Four times a day (QID) | ORAL | Status: DC | PRN

## 2018-06-25 MED ORDER — VANCOMYCIN HCL 500 MG IV SOLR
500.0000 mg | Freq: Once | INTRAVENOUS | Status: AC
  Administered 2018-06-25: 500 mg via INTRAVENOUS
  Filled 2018-06-25 (×2): qty 500

## 2018-06-25 MED ORDER — BENZONATATE 100 MG PO CAPS: 200.0000 mg | ORAL_CAPSULE | Freq: Three times a day (TID) | ORAL | Status: DC | PRN

## 2018-06-25 MED ORDER — SODIUM CHLORIDE 0.9 % IV SOLN
INTRAVENOUS | Status: AC
  Administered 2018-06-25: via INTRAVENOUS

## 2018-06-25 MED ORDER — SODIUM CHLORIDE 0.9 % IV BOLUS
1000.0000 mL | Freq: Once | INTRAVENOUS | Status: AC
  Administered 2018-06-25: 19:00:00 1000 mL via INTRAVENOUS

## 2018-06-25 MED ORDER — INSULIN ASPART 100 UNIT/ML ~~LOC~~ SOLN
0.0000 [IU] | Freq: Three times a day (TID) | SUBCUTANEOUS | Status: DC
  Administered 2018-06-26: 17:00:00 1 [IU] via SUBCUTANEOUS
  Administered 2018-06-26: 2 [IU] via SUBCUTANEOUS
  Administered 2018-06-26 – 2018-06-27 (×2): 1 [IU] via SUBCUTANEOUS
  Administered 2018-06-27: 12:00:00 2 [IU] via SUBCUTANEOUS
  Administered 2018-06-28: 08:00:00 1 [IU] via SUBCUTANEOUS
  Administered 2018-06-28: 2 [IU] via SUBCUTANEOUS
  Filled 2018-06-25 (×7): qty 1

## 2018-06-25 MED ORDER — IOHEXOL 350 MG/ML SOLN
60.0000 mL | Freq: Once | INTRAVENOUS | Status: AC | PRN
  Administered 2018-06-25: 60 mL via INTRAVENOUS

## 2018-06-25 MED ORDER — VANCOMYCIN HCL IN DEXTROSE 1-5 GM/200ML-% IV SOLN
1000.0000 mg | Freq: Once | INTRAVENOUS | Status: AC
  Administered 2018-06-25: 1000 mg via INTRAVENOUS
  Filled 2018-06-25: qty 200

## 2018-06-25 MED ORDER — PRAVASTATIN SODIUM 40 MG PO TABS
40.0000 mg | ORAL_TABLET | Freq: Every day | ORAL | Status: DC
  Administered 2018-06-25 – 2018-06-27 (×3): 40 mg via ORAL
  Filled 2018-06-25: qty 2
  Filled 2018-06-25 (×2): qty 1
  Filled 2018-06-25: qty 2
  Filled 2018-06-25: qty 1
  Filled 2018-06-25: qty 2
  Filled 2018-06-25: qty 1

## 2018-06-25 MED ORDER — IPRATROPIUM-ALBUTEROL 0.5-2.5 (3) MG/3ML IN SOLN: 3.0000 mL | Freq: Four times a day (QID) | RESPIRATORY_TRACT | Status: DC | PRN

## 2018-06-25 MED ORDER — VANCOMYCIN HCL IN DEXTROSE 1-5 GM/200ML-% IV SOLN: 1000.0000 mg | INTRAVENOUS | Status: DC

## 2018-06-25 MED ORDER — ACETAMINOPHEN 650 MG RE SUPP: 650.0000 mg | Freq: Four times a day (QID) | RECTAL | Status: DC | PRN

## 2018-06-25 MED ORDER — MOMETASONE FURO-FORMOTEROL FUM 200-5 MCG/ACT IN AERO
2.0000 | INHALATION_SPRAY | Freq: Two times a day (BID) | RESPIRATORY_TRACT | Status: DC
  Administered 2018-06-26 – 2018-06-28 (×6): 2 via RESPIRATORY_TRACT
  Filled 2018-06-25: qty 8.8

## 2018-06-25 MED ORDER — ACETAMINOPHEN 325 MG PO TABS: 650.0000 mg | ORAL_TABLET | Freq: Four times a day (QID) | ORAL | Status: DC | PRN

## 2018-06-25 MED ORDER — PREGABALIN 75 MG PO CAPS
75.0000 mg | ORAL_CAPSULE | Freq: Two times a day (BID) | ORAL | Status: DC
  Administered 2018-06-26 – 2018-06-28 (×5): 75 mg via ORAL
  Filled 2018-06-25 (×6): qty 1

## 2018-06-25 MED ORDER — SODIUM CHLORIDE 0.9 % IV SOLN
1.0000 g | Freq: Two times a day (BID) | INTRAVENOUS | Status: DC
  Administered 2018-06-26 – 2018-06-27 (×3): 1 g via INTRAVENOUS
  Filled 2018-06-25 (×3): qty 1

## 2018-06-25 MED ORDER — SUNITINIB MALATE 12.5 MG PO CAPS: 37.5000 mg | ORAL_CAPSULE | Freq: Every day | ORAL | Status: DC

## 2018-06-25 MED ORDER — INSULIN ASPART 100 UNIT/ML ~~LOC~~ SOLN: 0.0000 [IU] | Freq: Every day | SUBCUTANEOUS | Status: DC

## 2018-06-25 MED ORDER — IPRATROPIUM-ALBUTEROL 0.5-2.5 (3) MG/3ML IN SOLN
3.0000 mL | Freq: Once | RESPIRATORY_TRACT | Status: AC
  Administered 2018-06-25: 19:00:00 3 mL via RESPIRATORY_TRACT
  Filled 2018-06-25: qty 3

## 2018-06-25 MED ORDER — HEPARIN SODIUM (PORCINE) 5000 UNIT/ML IJ SOLN
5000.0000 [IU] | Freq: Three times a day (TID) | INTRAMUSCULAR | Status: DC
  Administered 2018-06-25 – 2018-06-28 (×8): 5000 [IU] via SUBCUTANEOUS
  Filled 2018-06-25 (×8): qty 1

## 2018-06-25 MED ORDER — BISMUTH SUBSALICYLATE 262 MG/15ML PO SUSP
30.0000 mL | Freq: Four times a day (QID) | ORAL | Status: DC | PRN
  Filled 2018-06-25: qty 118

## 2018-06-25 MED ORDER — ONDANSETRON HCL 4 MG/2ML IJ SOLN: 4.0000 mg | Freq: Four times a day (QID) | INTRAMUSCULAR | Status: DC | PRN

## 2018-06-25 MED ORDER — SODIUM CHLORIDE 0.9 % IV SOLN
2.0000 g | Freq: Once | INTRAVENOUS | Status: AC
  Administered 2018-06-25: 2 g via INTRAVENOUS
  Filled 2018-06-25: qty 2

## 2018-06-25 MED ORDER — GUAIFENESIN-DM 100-10 MG/5ML PO SYRP: 5.0000 mL | ORAL_SOLUTION | ORAL | Status: DC | PRN

## 2018-06-25 MED ORDER — TIOTROPIUM BROMIDE MONOHYDRATE 18 MCG IN CAPS
18.0000 ug | ORAL_CAPSULE | Freq: Every day | RESPIRATORY_TRACT | Status: DC
  Administered 2018-06-26 – 2018-06-28 (×3): 18 ug via RESPIRATORY_TRACT
  Filled 2018-06-25: qty 5

## 2018-06-25 MED ORDER — SODIUM CHLORIDE 0.9 % IV BOLUS
1000.0000 mL | Freq: Once | INTRAVENOUS | Status: AC
  Administered 2018-06-25: 22:00:00 1000 mL via INTRAVENOUS

## 2018-06-25 NOTE — ED Notes (Signed)
Pt having an anxiety attack screaming for [place] to get her husband saying she can't be alone in the room. Advised pt not possible during a pandemic. Pt going to xr but will inform dr.

## 2018-06-25 NOTE — Progress Notes (Signed)
Pharmacy Antibiotic Note  [name] is a 73 y.o. female admitted on 06/25/2018 with sepsis.  Pharmacy has been consulted for Vancomycin, Cefepime dosing.  Plan: Cefepime 2 gm IV X 1 given in ED on 4/19 @ 1852. Cefepime 1 gm IV Q12H ordered to start on 4/20 @ 0700.  Vancomycin 1 gm IV X 1 given in ED on 4/19 @ 1853. Vancomycin 500 mg IV X 1 ordered to be given following initial 1 gm dose to make total loading dose of 1500 mg. No peak and trough currently ordered.   CrCl = 26 ml/min Vd = 40.9 L  Ke = 0.026 hr-1 T1/2 = 27.1 hrs  AUC = 477.9 Vanc trough = 10.4  Height: 5\' 4"  (162.6 cm) Weight: 125 lb 3.5 oz (56.8 kg) IBW/kg (Calculated) : 54.7  Temp (24hrs), Avg:98.6 F (37 C), Min:98.6 F (37 C), Max:98.6 F (37 C)  Recent Labs  Lab 06/25/18 1725  WBC 27.7*  CREATININE 1.67*  LATICACIDVEN 2.7*    Estimated Creatinine Clearance: 25.5 mL/min (A) (by C-G formula based on SCr of 1.67 mg/dL (H)).    Allergies  Allergen Reactions  . Budesonide-Formoterol Fumarate Other (See Comments)    (Symbicort) coughed worse  . Lipitor [Atorvastatin] Other (See Comments)    Nosebleeds  . Penicillins Rash    Did it involve swelling of the face/tongue/throat, SOB, or low BP? No Did it involve sudden or severe rash/hives, skin peeling, or any reaction on the inside of your mouth or nose? No Did you need to seek medical attention at a hospital or doctor's office? No When did it last happen?childhood allergy If all above answers are "NO", may proceed with cephalosporin use.     Antimicrobials this admission:   >>    >>  Dose adjustments this admission:   Microbiology results:  BCx:   UCx:   Sputum:    MRSA PCR:   Thank you for allowing pharmacy to be a part of this patient's care.  Khia Dieterich D 06/25/2018 9:01 PM

## 2018-06-25 NOTE — ED Notes (Signed)
ED TO INPATIENT HANDOFF REPORT  ED Nurse Name and Phone #: Tana Conch [phone]   S Name/Age/Gender [name] 73 y.o. female Room/Bed: ED06A/ED06A  Code Status   Code Status: Prior  Home/SNF/Other Home Patient oriented to: self, place, time and situation Is this baseline? Yes   Triage Complete: Triage complete  Chief Complaint short of breath  Triage Note PT to ED via EMS from home. PT called out for SOB. PT hx of kidney/liver cancer. PT just started new chemo drug, with side effects of SOB. PT has chronic 4L oxygen use. Placed on NRB by EMS for 85% o2 sat.    Allergies Allergies  Allergen Reactions  . Budesonide-Formoterol Fumarate Other (See Comments)    (Symbicort) coughed worse  . Lipitor [Atorvastatin] Other (See Comments)    Nosebleeds  . Penicillins Rash    Did it involve swelling of the face/tongue/throat, SOB, or low BP? No Did it involve sudden or severe rash/hives, skin peeling, or any reaction on the inside of your mouth or nose? No Did you need to seek medical attention at a hospital or doctor's office? No When did it last happen?childhood allergy If all above answers are "NO", may proceed with cephalosporin use.     Level of Care/Admitting Diagnosis ED Disposition    ED Disposition Condition [facility]: [place] [[id number]]  Level of Care: Med-Surg [16]  Covid Evaluation: N/A  Diagnosis: Severe sepsis [facility]) [[id number]]  Admitting Physician: Lance Coon [[id number]]  Attending Physician: Lance Coon [phone]  Estimated length of stay: past midnight tomorrow  Certification:: I certify this patient will need inpatient services for at least 2 midnights  PT Class (Do Not Modify): Inpatient [101]  PT Acc Code (Do Not Modify): Private [1]       B Medical/Surgery History Past Medical History:  Diagnosis Date  . Allergy   . Anemia   . Anxiety   . Asthma   . Blood transfusion without reported  diagnosis   . Breast cancer [facility])    h/o- radiation  . Carcinoid tumor of lung 1991  . Cataract    removed x 1   . COPD (chronic obstructive pulmonary disease) ([place])   . Depression   . Diabetes mellitus type II   . Diverticulosis   . Dyspnea    with exertion   . Gastroparesis   . GERD (gastroesophageal reflux disease)   . History of adenomatous polyp of colon   . History of breast cancer   . History of secondary kidney cancer   . Hyperlipidemia   . Liver cancer ([place])   . Mild cognitive impairment   . Neuromuscular disorder (H[facility])    neuropathy   . Osteoarthritis   . Oxygen deficiency    2 liters at bedtime   . Pneumonia   . Pulmonary nodule    multiple  . Renal cell carcinoma    Past Surgical History:  Procedure Laterality Date  . BIOPSY  05/16/2018   Procedure: BIOPSY;  Surgeon: Rush Landmark Telford Nab., MD;  Location: [facility];  Service: Gastroenterology;;  . BREAST LUMPECTOMY  1998   right breast  . CHOLECYSTECTOMY  1975  . COLONOSCOPY    . COLONOSCOPY WITH PROPOFOL N/A 05/16/2018   Procedure: COLONOSCOPY WITH PROPOFOL;  Surgeon: Rush Landmark Telford Nab., MD;  Location: [facility];  Service: Gastroenterology;  Laterality: N/A;  . HEMOSTASIS CLIP PLACEMENT  05/16/2018   Procedure: HEMOSTASIS CLIP PLACEMENT;  Surgeon: Irving Copas., MD;  Location: [facility];  Service: Gastroenterology;;  . HIP FRACTURE SURGERY Left 03/2018  . HIP PINNING,CANNULATED Left 04/06/2018   Procedure: CANNULATED SCREWS/HIP PINNING LEFT HIP;  Surgeon: Mcarthur Rossetti, MD;  Location: [facility];  Service: Orthopedics;  Laterality: Left;  . LAPAROSCOPIC NEPHRECTOMY  01/2009   right  . LUNG SURGERY  1994   carcinoid removal  . POLYPECTOMY    . POLYPECTOMY  05/16/2018   Procedure: POLYPECTOMY;  Surgeon: Irving Copas., MD;  Location: [facility];  Service: Gastroenterology;;  . [place]     A IV Location/Drains/Wounds Patient  Lines/Drains/Airways Status   Active Line/Drains/Airways    Name:   Placement date:   Placement time:   Site:   Days:   Peripheral IV 06/25/18 Right Antecubital   06/25/18    1800    Antecubital   less than 1   Peripheral IV 06/25/18 Right Forearm   06/25/18    1830    Forearm   less than 1   Incision (Closed) 04/06/18 Hip Left   04/06/18    1223     80   Incision (Closed) 05/25/18 Abdomen Right;Upper   05/25/18    1409     31          Intake/Output Last 24 hours  Intake/Output Summary (Last 24 hours) at 06/25/2018 2047 Last data filed at 06/25/2018 1953 Gross per 24 hour  Intake 300 ml  Output -  Net 300 ml    Labs/Imaging Results for orders placed or performed during the hospital encounter of 06/25/18 (from the past 48 hour(s))  CBC with Differential     Status: Abnormal   Collection Time: 06/25/18  5:25 PM  Result Value Ref Range   WBC 27.7 (H) 4.0 - 10.5 K/uL   RBC 3.97 3.87 - 5.11 MIL/uL   Hemoglobin 9.9 (L) 12.0 - 15.0 g/dL   HCT 33.2 (L) 36.0 - 46.0 %   MCV 83.6 80.0 - 100.0 fL   MCH 24.9 (L) 26.0 - 34.0 pg   MCHC 29.8 (L) 30.0 - 36.0 g/dL   RDW 15.3 11.5 - 15.5 %   Platelets 387 150 - 400 K/uL   nRBC 0.0 0.0 - 0.2 %   Neutrophils Relative % 86 %   Neutro Abs 23.8 (H) 1.7 - 7.7 K/uL   Lymphocytes Relative 10 %   Lymphs Abs 2.6 0.7 - 4.0 K/uL   Monocytes Relative 3 %   Monocytes Absolute 0.9 0.1 - 1.0 K/uL   Eosinophils Relative 0 %   Eosinophils Absolute 0.1 0.0 - 0.5 K/uL   Basophils Relative 0 %   Basophils Absolute 0.1 0.0 - 0.1 K/uL   WBC Morphology MORPHOLOGY UNREMARKABLE    Smear Review Normal platelet morphology    Immature Granulocytes 1 %   Abs Immature Granulocytes 0.27 (H) 0.00 - 0.07 K/uL   Polychromasia PRESENT     Comment: Performed at [facility], [address] [place], [place] [address]  Brain natriuretic peptide     Status: Abnormal   Collection Time: 06/25/18  5:25 PM  Result Value Ref Range   B Natriuretic Peptide 150.0 (H)  0.0 - 100.0 pg/mL    Comment: Performed at [facility], [address] [place], [place] [address]  Lactic acid, plasma     Status: Abnormal   Collection Time: 06/25/18  5:25 PM  Result Value Ref Range   Lactic Acid, Venous 2.7 (HH) 0.5 - 1.9 mmol/L  Comment: CRITICAL RESULT CALLED TO, READ BACK BY AND VERIFIED WITH PAIGE JOHNSON AT 1800 06/25/2018.PMF Performed at [facility], [address] [place], [place] [address]   Comprehensive metabolic panel     Status: Abnormal   Collection Time: 06/25/18  5:25 PM  Result Value Ref Range   Sodium 136 135 - 145 mmol/L   Potassium 3.6 3.5 - 5.1 mmol/L   Chloride 99 98 - 111 mmol/L   CO2 24 22 - 32 mmol/L   Glucose, Bld 151 (H) 70 - 99 mg/dL   BUN 15 8 - 23 mg/dL   Creatinine, Ser 1.67 (H) 0.44 - 1.00 mg/dL   Calcium 6.7 (L) 8.9 - 10.3 mg/dL   Total Protein 5.9 (L) 6.5 - 8.1 g/dL   Albumin 2.6 (L) 3.5 - 5.0 g/dL   AST 18 15 - 41 U/L   ALT 11 0 - 44 U/L   Alkaline Phosphatase 72 38 - 126 U/L   Total Bilirubin 0.4 0.3 - 1.2 mg/dL   GFR calc non Af Amer 30 (L) >60 mL/min   GFR calc Af Amer 35 (L) >60 mL/min   Anion gap 13 5 - 15    Comment: Performed at [facility], [address] [place], [place] [address]  SARS Coronavirus 2 [facility] order, Performed in [place] hospital lab)     Status: None   Collection Time: 06/25/18  5:25 PM  Result Value Ref Range   SARS Coronavirus 2 NEGATIVE NEGATIVE    Comment: (NOTE) If result is NEGATIVE SARS-CoV-2 target nucleic acids are NOT DETECTED. The SARS-CoV-2 RNA is generally detectable in upper and lower  respiratory specimens during the acute phase of infection. The lowest  concentration of SARS-CoV-2 viral copies this assay can detect is 250  copies / mL. A negative result does not preclude SARS-CoV-2 infection  and should not be used as the sole basis for treatment or other  patient management decisions.  A negative result may occur with  improper specimen  collection / handling, submission of specimen other  than nasopharyngeal swab, presence of viral mutation(s) within the  areas targeted by this assay, and inadequate number of viral copies  (<250 copies / mL). A negative result must be combined with clinical  observations, patient history, and epidemiological information. If result is POSITIVE SARS-CoV-2 target nucleic acids are DETECTED. The SARS-CoV-2 RNA is generally detectable in upper and lower  respiratory specimens dur ing the acute phase of infection.  Positive  results are indicative of active infection with SARS-CoV-2.  Clinical  correlation with patient history and other diagnostic information is  necessary to determine patient infection status.  Positive results do  not rule out bacterial infection or co-infection with other viruses. If result is PRESUMPTIVE POSTIVE SARS-CoV-2 nucleic acids MAY BE PRESENT.   A presumptive positive result was obtained on the submitted specimen  and confirmed on repeat testing.  While 2019 novel coronavirus  (SARS-CoV-2) nucleic acids may be present in the submitted sample  additional confirmatory testing may be necessary for epidemiological  and / or clinical management purposes  to differentiate between  SARS-CoV-2 and other Sarbecovirus currently known to infect humans.  If clinically indicated additional testing with an alternate test  methodology [phone]) is advised. The SARS-CoV-2 RNA is generally  detectable in upper and lower respiratory sp ecimens during the acute  phase of infection. The expected result is Negative. Fact Sheet for Patients:  StrictlyIdeas.no Fact Sheet for Healthcare Providers: BankingDealers.co.za This  test is not yet approved or cleared by the [place] and has been authorized for detection and/or diagnosis of SARS-CoV-2 by FDA under an Emergency Use Authorization (EUA).  This EUA will remain in effect  (meaning this test can be used) for the duration of the COVID-19 declaration under Section 564(b)(1) of the Act, 21 U.S.C. section 360bbb-3(b)(1), unless the authorization is terminated or revoked sooner. Performed at [facility], [place], [place] [address]    Ct Angio Chest Pe W And/or Wo Contrast  Result Date: 06/25/2018 CLINICAL DATA:  Shortness of breath EXAM: CT ANGIOGRAPHY CHEST WITH CONTRAST TECHNIQUE: Multidetector CT imaging of the chest was performed using the standard protocol during bolus administration of intravenous contrast. Multiplanar CT image reconstructions and MIPs were obtained to evaluate the vascular anatomy. CONTRAST:  48mL OMNIPAQUE IOHEXOL 350 MG/ML SOLN COMPARISON:  Chest CT 05/23/2018 FINDINGS: Cardiovascular: No filling defects in the pulmonary arteries to suggest pulmonary emboli. Heart is normal size. Moderate coronary artery and aortic atherosclerosis. No aneurysm. Mediastinum/Nodes: Borderline sized mediastinal lymph nodes. AP window lymph node has a short axis diameter of 10 mm, stable. Subcarinal lymph node has a short axis diameter of 7 mm, stable. Similarly sized pretracheal and paratracheal lymph nodes. Lungs/Pleura: Innumerable bilateral pulmonary nodules are again noted as seen on recent chest CT. In addition, there are extensive new solid and ground-glass nodular airspace disease most pronounced throughout the right lung, also noted in the left lung to a lesser extent. No effusions. Upper Abdomen: Imaging into the upper abdomen shows no acute findings. The previously seen large left hepatic mass is again noted, unchanged. Musculoskeletal: Chest wall soft tissues are unremarkable. No acute bony abnormality. Review of the MIP images confirms the above findings. IMPRESSION: Innumerable bilateral pulmonary nodules, similar to prior study. Metastases not excluded as described on recent chest CT. New solid and ground-glass nodular airspace  disease throughout both lungs, right much greater than left. Favor infectious or inflammatory process such as pneumonia, atypical/viral pneumonia. Drug reaction felt less likely but not excluded. Large left hepatic mass again noted, stable since prior study, better seen on prior study. Three-vessel coronary artery disease Aortic Atherosclerosis (ICD10-I70.0). Electronically Signed   By: Rolm Baptise M.D.   On: 06/25/2018 19:59   Dg Chest Port 1 View  Result Date: 06/25/2018 CLINICAL DATA:  Acute shortness of breath. History of metastatic renal cell carcinoma. EXAM: PORTABLE CHEST 1 VIEW COMPARISON:  05/23/2018 CT and 04/08/2018 chest radiograph FINDINGS: Cardiomediastinal silhouette is unremarkable. New confluent and nodular opacities throughout the RIGHT lung noted. LEFT basilar atelectasis/scarring again noted. No pleural effusion or pneumothorax. IMPRESSION: New confluent and nodular opacities throughout the RIGHT lung which may represent increasing metastatic disease and/or infection. Electronically Signed   By: Margarette Canada M.D.   On: 06/25/2018 18:01    Pending Labs Unresulted Labs (From admission, onward)    Start     Ordered   06/25/18 2020  Lactic acid, plasma  ONCE - STAT,   STAT     06/25/18 2019   06/25/18 2019  Procalcitonin - Baseline  Add-on,   AD     06/25/18 2018   06/25/18 1722  Blood culture (routine x 2)  BLOOD CULTURE X 2,   STAT     06/25/18 1722   Signed and Held  CBC  (heparin)  Once,   R    Comments:  Baseline for heparin therapy IF NOT ALREADY DRAWN.  Notify MD if PLT <  100 K.    Signed and Held   Signed and Held  Creatinine, serum  (heparin)  Once,   R    Comments:  Baseline for heparin therapy IF NOT ALREADY DRAWN.    Signed and Held   Signed and Held  Basic metabolic panel  Tomorrow morning,   R     Signed and Held   Signed and Held  CBC  Tomorrow morning,   R     Signed and Held          Vitals/Pain Today's Vitals   06/25/18 1900 06/25/18 2018 06/25/18  2030 06/25/18 2038  BP: 103/88 111/73 114/66   Pulse: 97 (!) 103 (!) 111 (!) 112  Resp: 19 (!) 26 19 20   Temp:      TempSrc:      SpO2: 100% 92% 96% 100%  Weight:      Height:      PainSc:        Isolation Precautions Droplet and Contact precautions  Medications Medications  vancomycin (VANCOCIN) IVPB 1000 mg/200 mL premix (0 mg Intravenous Stopped 06/25/18 1953)  ceFEPIme (MAXIPIME) 2 g in sodium chloride 0.9 % 100 mL IVPB (0 g Intravenous Stopped 06/25/18 1922)  ipratropium-albuterol (DUONEB) 0.5-2.5 (3) MG/3ML nebulizer solution 3 mL (3 mLs Nebulization Given 06/25/18 1854)  sodium chloride 0.9 % bolus 1,000 mL (1,000 mLs Intravenous New Bag/Given 06/25/18 1856)  iohexol (OMNIPAQUE) 350 MG/ML injection 60 mL (60 mLs Intravenous Contrast Given 06/25/18 1942)    Mobility walks Low fall risk   Focused Assessments pulmonary   R Recommendations: See Admitting Provider Note  Report given to:   Additional Notes:

## 2018-06-25 NOTE — ED Notes (Signed)
Dr. Jannifer Franklin aware of Lactic acid of 4.

## 2018-06-25 NOTE — ED Triage Notes (Signed)
PT to ED via EMS from home. PT called out for SOB. PT hx of kidney/liver cancer. PT just started new chemo drug, with side effects of SOB. PT has chronic 4L oxygen use. Placed on NRB by EMS for 85% o2 sat.

## 2018-06-25 NOTE — ED Notes (Signed)
Pts husband Teletha Petrea left contact information and wishes to be contacted with updates. Cell: [phone] Home: [phone]

## 2018-06-25 NOTE — ED Notes (Signed)
Patient ambulated with two-person assist and 6L on portable oxygen to and from room bathroom. Patient had increased labored breathing while ambulating which decreased when back on the stretcher.

## 2018-06-25 NOTE — ED Provider Notes (Signed)
[facility] Emergency Department Provider Note       Time seen: ----------------------------------------- 5:22 PM on 06/25/2018 -----------------------------------------   I have reviewed the triage vital signs and the nursing notes.  HISTORY   Chief Complaint Shortness of Breath    HPI [name] is a 73 y.o. female with a history of anemia, carcinoid tumor of the lung, COPD, diabetes, GERD, hyperlipidemia, neuromuscular disorder, pneumonia, renal cell carcinoma who presents to the ED for shortness of breath.  Patient has had some chest tightness and shortness of breath.  She has not had a cough, denies fevers, chills, vomiting or diarrhea.  She started on a new chemotherapy drug recently with side effects possibly being the shortness of breath.  She is on chronic 4 L of home oxygen use.  Past Medical History:  Diagnosis Date  . Allergy   . Anemia   . Anxiety   . Asthma   . Blood transfusion without reported diagnosis   . Breast cancer [facility])    h/o- radiation  . Carcinoid tumor of lung 1991  . Cataract    removed x 1   . COPD (chronic obstructive pulmonary disease) ([place])   . Depression   . Diabetes mellitus type II   . Diverticulosis   . Dyspnea    with exertion   . Gastroparesis   . GERD (gastroesophageal reflux disease)   . History of adenomatous polyp of colon   . History of breast cancer   . History of secondary kidney cancer   . Hyperlipidemia   . Liver cancer ([place])   . Mild cognitive impairment   . Neuromuscular disorder (H[facility])    neuropathy   . Osteoarthritis   . Oxygen deficiency    2 liters at bedtime   . Pneumonia   . Pulmonary nodule    multiple  . Renal cell carcinoma     Patient Active Problem List   Diagnosis Date Noted  . Calf swelling 06/23/2018  . Metastatic renal cell carcinoma ([place]) 06/23/2018  . On home oxygen therapy 05/11/2018  . Chronic respiratory failure with hypoxia ([place]) 05/02/2018  . Leg ulcer,  right, limited to breakdown of skin ([place]) 05/02/2018  . Hip fracture requiring operative repair, left, closed, initial encounter ([place]) 04/06/2018  . Traumatic closed nondisplaced fracture of neck of left femur ([place]) 04/05/2018  . Left hip pain 11/08/2017  . CKD stage 3 due to type 2 diabetes mellitus ([place]) 01/26/2017  . Advance directive discussed with patient 05/02/2015  . Rectocele 05/01/2015  . Uterine prolapse 05/01/2015  . Vascular dementia without behavioral disturbance ([place])   . Routine general medical examination at a health care facility 08/10/2010  . Type 2 diabetes, controlled, with neuropathy ([place]) 05/16/2009  . OSTEOARTHRITIS 05/16/2009  . DIVERTICULOSIS-COLON 12/23/2008  . COPD (chronic obstructive pulmonary disease) ([place]) 02/15/2008  . HYPERLIPIDEMIA 12/22/2007  . Episodic mood disorder ([place]) 12/22/2007  . ALLERGIC RHINITIS 12/22/2007  . Multiple pulmonary nodules 12/22/2007  . GERD 12/22/2007  . BREAST CANCER, HX OF 12/22/2007  . COLONIC POLYPS, HX OF 12/22/2007    Past Surgical History:  Procedure Laterality Date  . BIOPSY  05/16/2018   Procedure: BIOPSY;  Surgeon: Rush Landmark Telford Nab., MD;  Location: [facility];  Service: Gastroenterology;;  . BREAST LUMPECTOMY  1998   right breast  . CHOLECYSTECTOMY  1975  . COLONOSCOPY    . COLONOSCOPY WITH PROPOFOL N/A 05/16/2018   Procedure: COLONOSCOPY WITH PROPOFOL;  Surgeon: Rush Landmark Telford Nab., MD;  Location: [facility];  Service: Gastroenterology;  Laterality: N/A;  . HEMOSTASIS CLIP PLACEMENT  05/16/2018   Procedure: HEMOSTASIS CLIP PLACEMENT;  Surgeon: Irving Copas., MD;  Location: [facility];  Service: Gastroenterology;;  . HIP FRACTURE SURGERY Left 03/2018  . HIP PINNING,CANNULATED Left 04/06/2018   Procedure: CANNULATED SCREWS/HIP PINNING LEFT HIP;  Surgeon: Mcarthur Rossetti, MD;  Location: [facility];  Service: Orthopedics;  Laterality: Left;  . LAPAROSCOPIC NEPHRECTOMY  01/2009   right  .  LUNG SURGERY  1994   carcinoid removal  . POLYPECTOMY    . POLYPECTOMY  05/16/2018   Procedure: POLYPECTOMY;  Surgeon: Mansouraty, Telford Nab., MD;  Location: [facility];  Service: Gastroenterology;;  . TONSILLECTOMY AND ADENOIDECTOMY  1953    Allergies Budesonide-formoterol fumarate; Lipitor [atorvastatin]; and Penicillins  Social History Social History   Tobacco Use  . Smoking status: Former Smoker    Packs/day: 1.00    Years: 30.00    Pack years: 30.00    Types: Cigarettes    Last attempt to quit: 03/08/1993    Years since quitting: 25.3  . Smokeless tobacco: Never Used  Substance Use Topics  . Alcohol use: No  . Drug use: No   Review of Systems Constitutional: Negative for fever. Cardiovascular: Negative for chest pain. Respiratory: Positive for shortness of breath Gastrointestinal: Negative for abdominal pain, vomiting and diarrhea. Musculoskeletal: Negative for back pain. Skin: Negative for rash. Neurological: Negative for headaches, focal weakness or numbness.  All systems negative/normal/unremarkable except as stated in the HPI  ____________________________________________   PHYSICAL EXAM:  VITAL SIGNS: ED Triage Vitals  Enc Vitals Group     BP --      Pulse --      Resp --      Temp --      Temp src --      SpO2 06/25/18 1712 (!) 85 %     Weight 06/25/18 1716 125 lb 3.5 oz (56.8 kg)     Height 06/25/18 1716 5\' 4"  (1.626 m)     Head Circumference --      Peak Flow --      Pain Score 06/25/18 1715 6     Pain Loc --      Pain Edu? --      Excl. in [place]? --    Constitutional: Alert and oriented.  Mild to moderate distress Eyes: Conjunctivae are normal. Normal extraocular movements. ENT      Head: Normocephalic and atraumatic.      Nose: No congestion/rhinnorhea.      Mouth/Throat: Mucous membranes are moist.      Neck: No stridor. Cardiovascular: Normal rate, regular rhythm. No murmurs, rubs, or gallops. Respiratory: Tachypnea with diminished  breath sounds bilaterally Gastrointestinal: Soft and nontender. Normal bowel sounds Musculoskeletal: Nontender with normal range of motion in extremities.  Mild pitting edema is noted Neurologic:  Normal speech and language. No gross focal neurologic deficits are appreciated.  Skin:  Skin is warm, dry and intact. No rash noted. Psychiatric: Mood and affect are normal. Speech and behavior are normal.  ____________________________________________  ED COURSE:  As part of my medical decision making, I reviewed the following data within the [place] History obtained from family if available, nursing notes, old chart and ekg, as well as notes from prior ED visits. Patient presented for dyspnea and hypoxia, we will assess with labs and imaging as indicated at this time.   Procedures  [name] was evaluated in  Emergency Department on 06/25/2018 for the symptoms described in the history of present illness. She was evaluated in the context of the global COVID-19 pandemic, which necessitated consideration that the patient might be at risk for infection with the SARS-CoV-2 virus that causes COVID-19. Institutional protocols and algorithms that pertain to the evaluation of patients at risk for COVID-19 are in a state of rapid change based on information released by regulatory bodies including the CDC and federal and state organizations. These policies and algorithms were followed during the patient's care in the ED.   EKG: Interpreted by me, sinus tachycardia with a rate of 102 bpm, low voltage, normal axis, normal QT ____________________________________________   LABS (pertinent positives/negatives)  Labs Reviewed  CBC WITH DIFFERENTIAL/PLATELET - Abnormal; Notable for the following components:      Result Value   WBC 27.7 (*)    Hemoglobin 9.9 (*)    HCT 33.2 (*)    MCH 24.9 (*)    MCHC 29.8 (*)    Neutro Abs 23.8 (*)    Abs Immature Granulocytes 0.27 (*)    All other  components within normal limits  BRAIN NATRIURETIC PEPTIDE - Abnormal; Notable for the following components:   B Natriuretic Peptide 150.0 (*)    All other components within normal limits  LACTIC ACID, PLASMA - Abnormal; Notable for the following components:   Lactic Acid, Venous 2.7 (*)    All other components within normal limits  COMPREHENSIVE METABOLIC PANEL - Abnormal; Notable for the following components:   Glucose, Bld 151 (*)    Creatinine, Ser 1.67 (*)    Calcium 6.7 (*)    Total Protein 5.9 (*)    Albumin 2.6 (*)    GFR calc non Af Amer 30 (*)    GFR calc Af Amer 35 (*)    All other components within normal limits  LACTIC ACID, PLASMA - Abnormal; Notable for the following components:   Lactic Acid, Venous 4.0 (*)    All other components within normal limits  SARS CORONAVIRUS 2 (HOSPITAL ORDER, [place] [facility])  CULTURE, BLOOD (ROUTINE X 2)  CULTURE, BLOOD (ROUTINE X 2)  PROCALCITONIN  CBC  CREATININE, SERUM  BASIC METABOLIC PANEL  CBC   CRITICAL CARE Performed by: Laurence Aly   Total critical care time: 30 minutes  Critical care time was exclusive of separately billable procedures and treating other patients.  Critical care was necessary to treat or prevent imminent or life-threatening deterioration.  Critical care was time spent personally by me on the following activities: development of treatment plan with patient and/or surrogate as well as nursing, discussions with consultants, evaluation of patient's response to treatment, examination of patient, obtaining history from patient or surrogate, ordering and performing treatments and interventions, ordering and review of laboratory studies, ordering and review of radiographic studies, pulse oximetry and re-evaluation of patient's condition.  RADIOLOGY Images were viewed by me  Chest x-ray IMPRESSION: New confluent and nodular opacities throughout the RIGHT lung which may  represent increasing metastatic disease and/or infection. IMPRESSION: Innumerable bilateral pulmonary nodules, similar to prior study. Metastases not excluded as described on recent chest CT.  New solid and ground-glass nodular airspace disease throughout both lungs, right much greater than left. Favor infectious or inflammatory process such as pneumonia, atypical/viral pneumonia. Drug reaction felt less likely but not excluded.  Large left hepatic mass again noted, stable since prior study, better seen on prior study.  Three-vessel coronary artery disease  Aortic  Atherosclerosis (ICD10-I70.0). ____________________________________________   DIFFERENTIAL DIAGNOSIS   CHF, COPD, pneumonia, coronavirus, pneumothorax, PE  FINAL ASSESSMENT AND PLAN  Community-acquired pneumonia, possible sepsis   Plan: The patient had presented for shortness of breath with hypoxia. Patient's labs did reveal a market elevation in her white blood cell count. Patient's imaging initially on x-ray revealed nodular opacities throughout the right lung concerning for metastatic disease or infection.  CT of the chest was performed which revealed infectious or inflammatory processes throughout the right lung.  Patient remains somewhat hypoxic.  I will discuss with the hospitalist for admission.   Laurence Aly, MD    Note: This note was generated in part or whole with voice recognition software. Voice recognition is usually quite accurate but there are transcription errors that can and very often do occur. I apologize for any typographical errors that were not detected and corrected.     Earleen Newport, MD 06/25/18 2146

## 2018-06-25 NOTE — H&P (Signed)
[facility] [place] NAME: [name]    MR#:  [id number]  DATE OF BIRTH:  05-17-1945  DATE OF ADMISSION:  06/25/2018  PRIMARY CARE PHYSICIAN: Venia Carbon, MD   REQUESTING/REFERRING PHYSICIAN: Jimmye Norman, MD  CHIEF COMPLAINT:   Chief Complaint  Patient presents with  . Shortness of Breath    HISTORY OF PRESENT ILLNESS:  [name]  is a 73 y.o. female who presents with chief complaint as above.  Patient presents to the ED with a complaint of increased shortness of breath.  She has a diagnosis of renal cancer, and was recently started on sunitinib.  She also has chronic respiratory failure and is on 4 L of oxygen at home at baseline.  On evaluation in the ED today she was found to meet sepsis criteria.  She has significant pneumonia on imaging.  She tested negative for novel coronavirus.  Hospitalist called for admission  PAST MEDICAL HISTORY:   Past Medical History:  Diagnosis Date  . Allergy   . Anemia   . Anxiety   . Asthma   . Blood transfusion without reported diagnosis   . Breast cancer [facility])    h/o- radiation  . Carcinoid tumor of lung 1991  . Cataract    removed x 1   . COPD (chronic obstructive pulmonary disease) ([place])   . Depression   . Diabetes mellitus type II   . Diverticulosis   . Dyspnea    with exertion   . Gastroparesis   . GERD (gastroesophageal reflux disease)   . History of adenomatous polyp of colon   . History of breast cancer   . History of secondary kidney cancer   . Hyperlipidemia   . Liver cancer ([place])   . Mild cognitive impairment   . Neuromuscular disorder (HCC)    neuropathy   . Osteoarthritis   . Oxygen deficiency    2 liters at bedtime   . Pneumonia   . Pulmonary nodule    multiple  . Renal cell carcinoma      PAST SURGICAL HISTORY:   Past Surgical History:  Procedure Laterality Date  . BIOPSY  05/16/2018   Procedure: BIOPSY;  Surgeon: Rush Landmark Telford Nab.,  MD;  Location: [facility];  Service: Gastroenterology;;  . BREAST LUMPECTOMY  1998   right breast  . CHOLECYSTECTOMY  1975  . COLONOSCOPY    . COLONOSCOPY WITH PROPOFOL N/A 05/16/2018   Procedure: COLONOSCOPY WITH PROPOFOL;  Surgeon: Rush Landmark Telford Nab., MD;  Location: [facility];  Service: Gastroenterology;  Laterality: N/A;  . HEMOSTASIS CLIP PLACEMENT  05/16/2018   Procedure: HEMOSTASIS CLIP PLACEMENT;  Surgeon: Irving Copas., MD;  Location: [facility];  Service: Gastroenterology;;  . HIP FRACTURE SURGERY Left 03/2018  . HIP PINNING,CANNULATED Left 04/06/2018   Procedure: CANNULATED SCREWS/HIP PINNING LEFT HIP;  Surgeon: Mcarthur Rossetti, MD;  Location: [facility];  Service: Orthopedics;  Laterality: Left;  . LAPAROSCOPIC NEPHRECTOMY  01/2009   right  . LUNG SURGERY  1994   carcinoid removal  . POLYPECTOMY    . POLYPECTOMY  05/16/2018   Procedure: POLYPECTOMY;  Surgeon: Mansouraty, Telford Nab., MD;  Location: [facility];  Service: Gastroenterology;;  . TONSILLECTOMY AND ADENOIDECTOMY  1953     SOCIAL HISTORY:   Social History   Tobacco Use  . Smoking status: Former Smoker    Packs/day: 1.00    Years: 30.00    Pack years: 30.00    Types:  Cigarettes    Last attempt to quit: 03/08/1993    Years since quitting: 25.3  . Smokeless tobacco: Never Used  Substance Use Topics  . Alcohol use: No     FAMILY HISTORY:   Family History  Problem Relation Age of Onset  . Colon cancer Father   . Lung cancer Father   . Stroke Mother   . Breast cancer Neg Hx   . Ovarian cancer Neg Hx   . Diabetes Neg Hx   . Colon polyps Neg Hx   . Esophageal cancer Neg Hx   . Rectal cancer Neg Hx   . Stomach cancer Neg Hx      DRUG ALLERGIES:   Allergies  Allergen Reactions  . Budesonide-Formoterol Fumarate Other (See Comments)    (Symbicort) coughed worse  . Lipitor [Atorvastatin] Other (See Comments)    Nosebleeds  . Penicillins Rash    Did it involve swelling of  the face/tongue/throat, SOB, or low BP? No Did it involve sudden or severe rash/hives, skin peeling, or any reaction on the inside of your mouth or nose? No Did you need to seek medical attention at a hospital or doctor's office? No When did it last happen?childhood allergy If all above answers are "NO", may proceed with cephalosporin use.     MEDICATIONS AT HOME:   Prior to Admission medications   Medication Sig Start Date End Date Taking? Authorizing Provider  cholecalciferol (VITAMIN D3) 25 MCG (1000 UT) tablet Take 1,000 Units by mouth daily.   Yes [provider]  metFORMIN (GLUCOPHAGE) 500 MG tablet TAKE 1 TABLET TWICE A DAY  WITH MEALS Patient taking differently: Take 500 mg by mouth 2 (two) times daily.  04/14/18  Yes Venia Carbon, MD  mometasone-formoterol [facility]) 200-5 MCG/ACT AERO Take 2 puffs first thing in am and then another 2 puffs about 12 hours later. Patient taking differently: Inhale 2 puffs into the lungs 2 (two) times daily. Take 2 puffs first thing in am and then another 2 puffs about 12 hours later. 04/26/18  Yes Venia Carbon, MD  Omega-3 Fatty Acids (FISH OIL) 1200 MG CAPS Take 1,200 mg by mouth daily.    Yes [provider]  pravastatin (PRAVACHOL) 40 MG tablet TAKE 1 TABLET DAILY Patient taking differently: Take 40 mg by mouth daily.  04/14/18  Yes Venia Carbon, MD  pregabalin (LYRICA) 75 MG capsule Take 1 capsule (75 mg total) by mouth 3 (three) times daily. Patient taking differently: Take 75 mg by mouth 2 (two) times daily.  11/08/17  Yes Viviana Simpler I, MD  SUNItinib (SUTENT) 12.5 MG capsule Take three caps daily for 2 weeks, one week off. 06/14/18  Yes Shadad, Mathis Dad, MD  tiotropium (SPIRIVA HANDIHALER) 18 MCG inhalation capsule INHALE THE CONTENTS OF 1   CAPSULE VIA HANDIHALER     DAILY Patient taking differently: Place 18 mcg into inhaler and inhale daily. INHALE THE CONTENTS OF 1   CAPSULE VIA HANDIHALER     DAILY 05/16/17   Yes Venia Carbon, MD  albuterol (PROVENTIL HFA;VENTOLIN HFA) 108 (90 Base) MCG/ACT inhaler Inhale 2 puffs into the lungs every 6 (six) hours as needed for wheezing or shortness of breath.     [provider]  albuterol (PROVENTIL) (2.5 MG/3ML) 0.083% nebulizer solution Take 3 mLs (2.5 mg total) by nebulization every 6 (six) hours as needed for wheezing or shortness of breath. 04/11/18   Venia Carbon, MD  bismuth subsalicylate (PEPTO BISMOL)  262 MG/15ML suspension Take 30 mLs by mouth every 6 (six) hours as needed for indigestion.    [provider]  diphenoxylate-atropine (LOMOTIL) 2.5-0.025 MG tablet Take 1 tablet by mouth 4 (four) times daily as needed for diarrhea or loose stools. 06/15/18   Wyatt Portela, MD  fexofenadine (ALLEGRA) 180 MG tablet Take 180 mg by mouth daily as needed (nasal drainage).    [provider]  fluticasone (FLONASE) 50 MCG/ACT nasal spray USE 2 SPRAYS NASALLY DAILY Patient taking differently: Place 2 sprays into both nostrils daily.  12/22/17   Venia Carbon, MD  furosemide (LASIX) 20 MG tablet Take 1 tablet (20 mg total) by mouth daily as needed. 06/23/18   Venia Carbon, MD  ondansetron (ZOFRAN) 4 MG tablet Take 1 tablet (4 mg total) by mouth every 6 (six) hours as needed for nausea or vomiting. 05/30/18   Danis, Kirke Corin, MD    REVIEW OF SYSTEMS:  Review of Systems  Constitutional: Positive for malaise/fatigue. Negative for chills, fever and weight loss.  HENT: Negative for ear pain, hearing loss and tinnitus.   Eyes: Negative for blurred vision, double vision, pain and redness.  Respiratory: Positive for shortness of breath. Negative for cough and hemoptysis.   Cardiovascular: Negative for chest pain, palpitations, orthopnea and leg swelling.  Gastrointestinal: Negative for abdominal pain, constipation, diarrhea, nausea and vomiting.  Genitourinary: Negative for dysuria, frequency and hematuria.  Musculoskeletal:  Negative for back pain, joint pain and neck pain.  Skin:       No acne, rash, or lesions  Neurological: Negative for dizziness, tremors, focal weakness and weakness.  Endo/Heme/Allergies: Negative for polydipsia. Does not bruise/bleed easily.  Psychiatric/Behavioral: Negative for depression. The patient is not nervous/anxious and does not have insomnia.      VITAL SIGNS:   Vitals:   06/25/18 1800 06/25/18 1830 06/25/18 1900 06/25/18 2018  BP: (!) 113/57 (!) 106/46 103/88 111/73  Pulse: 98  97 (!) 103  Resp: (!) 25 20 19  (!) 26  Temp:      TempSrc:      SpO2: 99%  100% 92%  Weight:      Height:       Wt Readings from Last 3 Encounters:  06/25/18 56.8 kg  06/06/18 56.8 kg  05/25/18 53.5 kg    PHYSICAL EXAMINATION:  Physical Exam  Vitals reviewed. Constitutional: She is oriented to person, place, and time. She appears well-developed and well-nourished. No distress.  HENT:  Head: Normocephalic and atraumatic.  Mouth/Throat: Oropharynx is clear and moist.  Eyes: Pupils are equal, round, and reactive to light. Conjunctivae and EOM are normal. No scleral icterus.  Neck: Normal range of motion. Neck supple. No JVD present. No thyromegaly present.  Cardiovascular: Normal rate, regular rhythm and intact distal pulses. Exam reveals no gallop and no friction rub.  No murmur heard. Respiratory: She is in respiratory distress (Mild, on chronic oxygen). She has no wheezes. She has no rales.  Bilateral, right greater than left, rhonchi  GI: Soft. Bowel sounds are normal. She exhibits no distension. There is no abdominal tenderness.  Musculoskeletal: Normal range of motion.        General: No edema.     Comments: No arthritis, no gout  Lymphadenopathy:    She has no cervical adenopathy.  Neurological: She is alert and oriented to person, place, and time. No cranial nerve deficit.  No dysarthria, no aphasia  Skin: Skin is warm and dry. No rash noted.  No erythema.  Psychiatric: She has  a normal mood and affect. Her behavior is normal. Judgment and thought content normal.    LABORATORY PANEL:   CBC Recent Labs  Lab 06/25/18 1725  WBC 27.7*  HGB 9.9*  HCT 33.2*  PLT 387   ------------------------------------------------------------------------------------------------------------------  Chemistries  Recent Labs  Lab 06/25/18 1725  NA 136  K 3.6  CL 99  CO2 24  GLUCOSE 151*  BUN 15  CREATININE 1.67*  CALCIUM 6.7*  AST 18  ALT 11  ALKPHOS 72  BILITOT 0.4   ------------------------------------------------------------------------------------------------------------------  Cardiac Enzymes No results for input(s): TROPONINI in the last 168 hours. ------------------------------------------------------------------------------------------------------------------  RADIOLOGY:  Ct Angio Chest Pe W And/or Wo Contrast  Result Date: 06/25/2018 CLINICAL DATA:  Shortness of breath EXAM: CT ANGIOGRAPHY CHEST WITH CONTRAST TECHNIQUE: Multidetector CT imaging of the chest was performed using the standard protocol during bolus administration of intravenous contrast. Multiplanar CT image reconstructions and MIPs were obtained to evaluate the vascular anatomy. CONTRAST:  64mL OMNIPAQUE IOHEXOL 350 MG/ML SOLN COMPARISON:  Chest CT 05/23/2018 FINDINGS: Cardiovascular: No filling defects in the pulmonary arteries to suggest pulmonary emboli. Heart is normal size. Moderate coronary artery and aortic atherosclerosis. No aneurysm. Mediastinum/Nodes: Borderline sized mediastinal lymph nodes. AP window lymph node has a short axis diameter of 10 mm, stable. Subcarinal lymph node has a short axis diameter of 7 mm, stable. Similarly sized pretracheal and paratracheal lymph nodes. Lungs/Pleura: Innumerable bilateral pulmonary nodules are again noted as seen on recent chest CT. In addition, there are extensive new solid and ground-glass nodular airspace disease most pronounced throughout the  right lung, also noted in the left lung to a lesser extent. No effusions. Upper Abdomen: Imaging into the upper abdomen shows no acute findings. The previously seen large left hepatic mass is again noted, unchanged. Musculoskeletal: Chest wall soft tissues are unremarkable. No acute bony abnormality. Review of the MIP images confirms the above findings. IMPRESSION: Innumerable bilateral pulmonary nodules, similar to prior study. Metastases not excluded as described on recent chest CT. New solid and ground-glass nodular airspace disease throughout both lungs, right much greater than left. Favor infectious or inflammatory process such as pneumonia, atypical/viral pneumonia. Drug reaction felt less likely but not excluded. Large left hepatic mass again noted, stable since prior study, better seen on prior study. Three-vessel coronary artery disease Aortic Atherosclerosis (ICD10-I70.0). Electronically Signed   By: Rolm Baptise M.D.   On: 06/25/2018 19:59   Dg Chest Port 1 View  Result Date: 06/25/2018 CLINICAL DATA:  Acute shortness of breath. History of metastatic renal cell carcinoma. EXAM: PORTABLE CHEST 1 VIEW COMPARISON:  05/23/2018 CT and 04/08/2018 chest radiograph FINDINGS: Cardiomediastinal silhouette is unremarkable. New confluent and nodular opacities throughout the RIGHT lung noted. LEFT basilar atelectasis/scarring again noted. No pleural effusion or pneumothorax. IMPRESSION: New confluent and nodular opacities throughout the RIGHT lung which may represent increasing metastatic disease and/or infection. Electronically Signed   By: Margarette Canada M.D.   On: 06/25/2018 18:01    EKG:   Orders placed or performed during the hospital encounter of 06/25/18  . ED EKG  . ED EKG    IMPRESSION AND PLAN:  Principal Problem:   Severe sepsis ([place]) -due to pneumonia as below, lactic acid was elevated, will administer IV fluids and recheck lactic acid until within normal limits.  Cultures sent from the ED.   IV antibiotics initiated.  Blood pressure has been stable Active Problems:   CAP (community acquired pneumonia) -IV  antibiotics as above, PRN supportive treatment   Acute on chronic renal failure (HCC) -IV fluids as above, avoid nephrotoxins and monitor for expected improvement   Type 2 diabetes, controlled, with neuropathy (HCC) -sliding scale insulin coverage with carb modified diet   COPD (chronic obstructive pulmonary disease) (HCC) -home dose inhalers, additional PRN supportive inhalers as above   Chronic respiratory failure with hypoxia (HCC) -continue supplemental oxygen, patient is on 4 L oxygen at home, she is satting 100% on 4 to 5 L here.   Metastatic renal cell carcinoma ([place]) -patient recently started sunitinib per chart review.  We will get an oncology consult to help with any recommendations regarding her chemotherapy   HLD (hyperlipidemia) -home dose antilipid   GERD -home dose PPI  Chart review performed and case discussed with ED provider. Labs, imaging and/or ECG reviewed by provider and discussed with patient/family. Management plans discussed with the patient and/or family.  DVT PROPHYLAXIS: SubQ heparin  GI PROPHYLAXIS:  PPI   ADMISSION STATUS: Inpatient     CODE STATUS: Full Code Status History    Date Active Date Inactive Code Status Order ID Comments User Context   04/06/2018 1536 04/09/2018 1523 Full Code [id number]  Mcarthur Rossetti, MD Inpatient    Advance Directive Documentation     Most Recent Value  Type of Advance Directive  Healthcare Power of [place], Living will, Out of facility DNR (pink MOST or yellow form)  Pre-existing out of facility DNR order (yellow form or pink MOST form)  -  "MOST" Form in Place?  -      TOTAL TIME TAKING CARE OF THIS PATIENT: 45 minutes.   Ethlyn Daniels 06/25/2018, 8:32 PM  [facility]  Office  [phone]  CC: Primary care physician; Venia Carbon, MD  Note:  This document was prepared  using Dragon voice recognition software and may include unintentional dictation errors.

## 2018-06-25 NOTE — ED Notes (Signed)
Patient was anxious, agitated that pump was beeping and her family wasn't at bedside. Dr. Jimmye Norman called patient's husband to discuss admission and patient was able to speak with her husband and that calmed her down. Patient still has labored breathing, tachypnea, but appears more calm at this time.

## 2018-06-25 NOTE — ED Notes (Signed)
Jimmye Norman, MD is aware of lactate level. Orders will follow.

## 2018-06-26 ENCOUNTER — Telehealth: Payer: Self-pay

## 2018-06-26 DIAGNOSIS — Z801 Family history of malignant neoplasm of trachea, bronchus and lung: Secondary | ICD-10-CM

## 2018-06-26 DIAGNOSIS — Z9981 Dependence on supplemental oxygen: Secondary | ICD-10-CM

## 2018-06-26 DIAGNOSIS — Z87891 Personal history of nicotine dependence: Secondary | ICD-10-CM

## 2018-06-26 DIAGNOSIS — J9620 Acute and chronic respiratory failure, unspecified whether with hypoxia or hypercapnia: Secondary | ICD-10-CM

## 2018-06-26 DIAGNOSIS — Z800 Family history of malignant neoplasm of digestive organs: Secondary | ICD-10-CM

## 2018-06-26 DIAGNOSIS — J189 Pneumonia, unspecified organism: Secondary | ICD-10-CM

## 2018-06-26 DIAGNOSIS — L8990 Pressure ulcer of unspecified site, unspecified stage: Secondary | ICD-10-CM

## 2018-06-26 DIAGNOSIS — R918 Other nonspecific abnormal finding of lung field: Secondary | ICD-10-CM

## 2018-06-26 DIAGNOSIS — C649 Malignant neoplasm of unspecified kidney, except renal pelvis: Secondary | ICD-10-CM

## 2018-06-26 DIAGNOSIS — R160 Hepatomegaly, not elsewhere classified: Secondary | ICD-10-CM

## 2018-06-26 DIAGNOSIS — A419 Sepsis, unspecified organism: Principal | ICD-10-CM

## 2018-06-26 DIAGNOSIS — Z66 Do not resuscitate: Secondary | ICD-10-CM

## 2018-06-26 LAB — BASIC METABOLIC PANEL
Anion gap: 9 (ref 5–15)
BUN: 17 mg/dL (ref 8–23)
CO2: 26 mmol/L (ref 22–32)
Calcium: 6.4 mg/dL — CL (ref 8.9–10.3)
Chloride: 105 mmol/L (ref 98–111)
Creatinine, Ser: 1.52 mg/dL — ABNORMAL HIGH (ref 0.44–1.00)
GFR calc Af Amer: 39 mL/min — ABNORMAL LOW (ref >60)
GFR calc non Af Amer: 33 mL/min — ABNORMAL LOW (ref >60)
Glucose, Bld: 127 mg/dL — ABNORMAL HIGH (ref 70–99)
Potassium: 3.8 mmol/L (ref 3.5–5.1)
Sodium: 140 mmol/L (ref 135–145)

## 2018-06-26 LAB — CBC
HCT: 31.3 % — ABNORMAL LOW (ref 36.0–46.0)
Hemoglobin: 9.4 g/dL — ABNORMAL LOW (ref 12.0–15.0)
MCH: 25.4 pg — ABNORMAL LOW (ref 26.0–34.0)
MCHC: 30 g/dL (ref 30.0–36.0)
MCV: 84.6 fL (ref 80.0–100.0)
Platelets: 361 10*3/uL (ref 150–400)
RBC: 3.7 MIL/uL — ABNORMAL LOW (ref 3.87–5.11)
RDW: 15.4 % (ref 11.5–15.5)
WBC: 29.1 10*3/uL — ABNORMAL HIGH (ref 4.0–10.5)
nRBC: 0 % (ref 0.0–0.2)

## 2018-06-26 LAB — GLUCOSE, CAPILLARY
Glucose-Capillary: 127 mg/dL — ABNORMAL HIGH (ref 70–99)
Glucose-Capillary: 117 mg/dL — ABNORMAL HIGH (ref 70–99)
Glucose-Capillary: 155 mg/dL — ABNORMAL HIGH (ref 70–99)
Glucose-Capillary: 138 mg/dL — ABNORMAL HIGH (ref 70–99)
Glucose-Capillary: 174 mg/dL — ABNORMAL HIGH (ref 70–99)

## 2018-06-26 LAB — LACTIC ACID, PLASMA
Lactic Acid, Venous: 2.2 mmol/L (ref 0.5–1.9)
Lactic Acid, Venous: 1.8 mmol/L (ref 0.5–1.9)

## 2018-06-26 MED ORDER — OMEGA-3-ACID ETHYL ESTERS 1 G PO CAPS
1.0000 g | ORAL_CAPSULE | Freq: Two times a day (BID) | ORAL | Status: DC
  Administered 2018-06-26 – 2018-06-28 (×5): 1 g via ORAL
  Filled 2018-06-26 (×5): qty 1

## 2018-06-26 MED ORDER — VITAMIN D 25 MCG (1000 UNIT) PO TABS
1000.0000 [IU] | ORAL_TABLET | Freq: Every day | ORAL | Status: DC
  Administered 2018-06-26 – 2018-06-28 (×3): 1000 [IU] via ORAL
  Filled 2018-06-26 (×3): qty 1

## 2018-06-26 MED ORDER — LORAZEPAM 2 MG/ML IJ SOLN
1.0000 mg | Freq: Once | INTRAMUSCULAR | Status: AC | PRN
  Administered 2018-06-26: 1 mg via INTRAVENOUS
  Filled 2018-06-26: qty 1

## 2018-06-26 MED ORDER — CALCIUM GLUCONATE-NACL 1-0.675 GM/50ML-% IV SOLN
1.0000 g | Freq: Once | INTRAVENOUS | Status: AC
  Administered 2018-06-26: 02:00:00 1000 mg via INTRAVENOUS
  Filled 2018-06-26: qty 50

## 2018-06-26 MED ORDER — CALCIUM GLUCONATE-NACL 1-0.675 GM/50ML-% IV SOLN
1.0000 g | Freq: Once | INTRAVENOUS | Status: AC
  Administered 2018-06-26: 10:00:00 1000 mg via INTRAVENOUS
  Filled 2018-06-26: qty 50

## 2018-06-26 NOTE — Progress Notes (Addendum)
[place] [facility] [place] NAME: [name]    MR#:  [id number]  DATE OF BIRTH:  30-Dec-1945  SUBJECTIVE:   Chief Complaint  Patient presents with  . Shortness of Breath   Patient still with sob with minimal exertion. She remains on oxygen at 6L  REVIEW OF SYSTEMS:  Review of Systems  Constitutional: Positive for malaise/fatigue and weight loss. Negative for chills and fever.  HENT: Negative for sore throat.   Respiratory: Positive for shortness of breath and wheezing. Negative for cough.   Cardiovascular: Positive for orthopnea and leg swelling. Negative for chest pain.  Gastrointestinal: Negative for abdominal pain, diarrhea, nausea and vomiting.  Genitourinary: Negative for dysuria.  Musculoskeletal: Negative for back pain and myalgias.  Neurological: Negative for dizziness, sensory change, speech change, focal weakness and headaches.   DRUG ALLERGIES:   Allergies  Allergen Reactions  . Budesonide-Formoterol Fumarate Other (See Comments)    (Symbicort) coughed worse  . Lipitor [Atorvastatin] Other (See Comments)    Nosebleeds  . Penicillins Rash    Did it involve swelling of the face/tongue/throat, SOB, or low BP? No Did it involve sudden or severe rash/hives, skin peeling, or any reaction on the inside of your mouth or nose? No Did you need to seek medical attention at a hospital or doctor's office? No When did it last happen?childhood allergy If all above answers are "NO", may proceed with cephalosporin use.    VITALS:  Blood pressure (!) 152/93, pulse 90, temperature 97.8 F (36.6 C), temperature source Oral, resp. rate (!) 22, height 5\' 3"  (1.6 m), weight 57.2 kg, SpO2 98 %. PHYSICAL EXAMINATION:  Physical Exam Review of Systems  Constitutional: Negative for chills, fever, malaise/fatigue and weight loss.  HENT: Negative for congestion, hearing loss and sore throat.   Eyes: Negative for blurred vision and double  vision.  Respiratory: Positive for shortness of breath and wheezing. Negative for cough.   Cardiovascular: Positive for orthopnea and leg swelling. Negative for chest pain and palpitations.  Gastrointestinal: Negative for abdominal pain, diarrhea, nausea and vomiting.  Genitourinary: Positive for frequency. Negative for dysuria and urgency.  Musculoskeletal: Negative for myalgias.  Skin: Negative for rash.  Neurological: Positive for weakness. Negative for dizziness, sensory change, speech change, focal weakness and headaches.  Psychiatric/Behavioral: Negative for depression.   DATA REVIEWED:  LABORATORY PANEL:  Female CBC Recent Labs  Lab 06/26/18 0046  WBC 29.1*  HGB 9.4*  HCT 31.3*  PLT 361   ------------------------------------------------------------------------------------------------------------------ Chemistries  Recent Labs  Lab 06/25/18 1725 06/26/18 0046  NA 136 140  K 3.6 3.8  CL 99 105  CO2 24 26  GLUCOSE 151* 127*  BUN 15 17  CREATININE 1.67* 1.52*  CALCIUM 6.7* 6.4*  AST 18  --   ALT 11  --   ALKPHOS 72  --   BILITOT 0.4  --    RADIOLOGY:  Ct Angio Chest Pe W And/or Wo Contrast  Result Date: 06/25/2018 CLINICAL DATA:  Shortness of breath EXAM: CT ANGIOGRAPHY CHEST WITH CONTRAST TECHNIQUE: Multidetector CT imaging of the chest was performed using the standard protocol during bolus administration of intravenous contrast. Multiplanar CT image reconstructions and MIPs were obtained to evaluate the vascular anatomy. CONTRAST:  50mL OMNIPAQUE IOHEXOL 350 MG/ML SOLN COMPARISON:  Chest CT 05/23/2018 FINDINGS: Cardiovascular: No filling defects in the pulmonary arteries to suggest pulmonary emboli. Heart is normal size. Moderate coronary artery and aortic atherosclerosis. No aneurysm. Mediastinum/Nodes: Borderline sized  mediastinal lymph nodes. AP window lymph node has a short axis diameter of 10 mm, stable. Subcarinal lymph node has a short axis diameter of 7 mm,  stable. Similarly sized pretracheal and paratracheal lymph nodes. Lungs/Pleura: Innumerable bilateral pulmonary nodules are again noted as seen on recent chest CT. In addition, there are extensive new solid and ground-glass nodular airspace disease most pronounced throughout the right lung, also noted in the left lung to a lesser extent. No effusions. Upper Abdomen: Imaging into the upper abdomen shows no acute findings. The previously seen large left hepatic mass is again noted, unchanged. Musculoskeletal: Chest wall soft tissues are unremarkable. No acute bony abnormality. Review of the MIP images confirms the above findings. IMPRESSION: Innumerable bilateral pulmonary nodules, similar to prior study. Metastases not excluded as described on recent chest CT. New solid and ground-glass nodular airspace disease throughout both lungs, right much greater than left. Favor infectious or inflammatory process such as pneumonia, atypical/viral pneumonia. Drug reaction felt less likely but not excluded. Large left hepatic mass again noted, stable since prior study, better seen on prior study. Three-vessel coronary artery disease Aortic Atherosclerosis (ICD10-I70.0). Electronically Signed   By: Rolm Baptise M.D.   On: 06/25/2018 19:59   Dg Chest Port 1 View  Result Date: 06/25/2018 CLINICAL DATA:  Acute shortness of breath. History of metastatic renal cell carcinoma. EXAM: PORTABLE CHEST 1 VIEW COMPARISON:  05/23/2018 CT and 04/08/2018 chest radiograph FINDINGS: Cardiomediastinal silhouette is unremarkable. New confluent and nodular opacities throughout the RIGHT lung noted. LEFT basilar atelectasis/scarring again noted. No pleural effusion or pneumothorax. IMPRESSION: New confluent and nodular opacities throughout the RIGHT lung which may represent increasing metastatic disease and/or infection. Electronically Signed   By: Margarette Canada M.D.   On: 06/25/2018 18:01   ASSESSMENT AND PLAN:   73 y.o. female with past  medical history of type 2 diabetes, COPD O2 dependent, CKD, hyperlipidemia, GERD and recent diagnosis of metastatic renal cell carcinoma presenting with complaints of shortness of breath.  # 1. Acute on chronic hypoxic respiratory failure secondary to community acquired pneumonia: Patient presented with progressive shortness of breath requiring supplemental oxygen.On oxygen at home -Supplemental O2 for dyspnea and/or hypoxia   -Scheduled and prn bronchodilator therapy Nebulized steroids    2. Community-acquired pneumonia- CT angio chest shows new solid and ground-glass nodular airspace disease throughout both lungs, right much greater than left. Lactic acid levels improving, WBC 29.1 -Patient on IV abx Cefepime and Vanc -IVFs -Blood cultures so far no growth -Ruled out COVID-19 infection  3. COPD (chronic obstructive pulmonary disease) (HCC) -home dose inhalers, additional PRN supportive inhalers as above  4. Acute on chronic renal failure (HCC) - -IV fluids as above, -avoid nephrotoxins and monitor for expected improvement -Will monitor labs  5.Type 2 diabetes, controlled, with neuropathy (HCC)  -sliding scale insulin coverage with carb modified diet  6. Metastatic renal cell carcinoma ([place]) -patient recently started sunitinib per chart review.  We will get an oncology consult to help with any recommendations regarding her chemotherapy  7. HLD (hyperlipidemia) -home dose antilipid  8. GERD -home dose PPI  9. Hypocalcemia- Improving -Replete with calcium gluconate -Recheck labs in am   All the records are reviewed and case discussed with Care Management/Social Worker. Management plans discussed with the patient, family and they are in agreement.  CODE STATUS: DNR  TOTAL TIME TAKING CARE OF THIS PATIENT: 38 minutes.   More than 50% of the time was spent in counseling/coordination of care: YES  POSSIBLE D/C IN 1.2 DAYS, DEPENDING ON CLINICAL CONDITION.   on 06/26/2018 at  2:48 PM  This patient was staffed with Dr. Stark Jock, Jude who personally evaluated patient, reviewed documentation and agreed with assessment and plan of care as above.  Rufina Falco, DNP, FNP-BC [facility] Nurse Practitioner   Between 7am to 6pm - Pager [phone]  After 6pm go to [email or web address] - Proofreader  [facility]  Office  [phone]  CC: Primary care physician; Venia Carbon, MD  Note: This dictation was prepared with Dragon dictation along with smaller phrase technology. Any transcriptional errors that result from this process are unintentional.

## 2018-06-26 NOTE — Telephone Encounter (Signed)
Noted She has pneumonia vs pulmonary drug reaction

## 2018-06-26 NOTE — Telephone Encounter (Signed)
I spoke to him today She was hospitalized with sudden SOB CT showed infection vs medication reaction Might have reaction to the medication for the pancreatic cancer but not sure

## 2018-06-26 NOTE — Telephone Encounter (Signed)
Temple Pacini with [facility] left v/m that due to illness last wk pt did not have [facility] PT visits; pt was admitted to [facility] on 06/25/18 and Steward Drone is not sure if pt will get any [facility] PT this week. FYI to Dr Silvio Pate.

## 2018-06-26 NOTE — Progress Notes (Signed)
Pt daughter told nursing she has DNR.  Chart review revealed same documented in PCP note from 12/23/2017.  Code status changed to DNR.  [facility] 06/26/2018, 12:43 AM  Note:  This document was prepared using Dragon voice recognition software and may include unintentional dictation errors.

## 2018-06-26 NOTE — Telephone Encounter (Signed)
I spoke with patient's husband. She is currently at [facility], they are seeing about getting her transferred to [facility] currently.

## 2018-06-26 NOTE — Consult Note (Signed)
Hematology/Oncology Consult note [facility] [phone] [phone]  Patient Care Team: Venia Carbon, MD as PCP - Patric Dykes, MD (Urology) Nicanor Alcon, MD (Thoracic Surgery) Lafayette Dragon, MD (Inactive) (Gastroenterology)   Name of the patient: Saryah Loper  [id number]  06-30-1945   Date of visit: 06/26/18 REASON FOR COSULTATION:  Sepsis, pneumonia, renal cell carcinoma, just started with Sutent, please assist with chemo regimen History of presenting illness-  73 y.o. female with PMH listed at below who presents to ER for evaluation of shortness of breath. Patient has a diagnosis of metastatic renal cell carcinoma, recently started on sunitinib. Chronic respiratory failure on 4 L of oxygen at home at baseline. At presentation in ED, she was found to have pneumonia and meeting sepsis criteria. COVID-19 was checked and was negative.  Patient is currently admitted.  Hemonc was consulted for recommendation of Sutent.  Independently reviewed patient' s CT chest angiography.  Patient has innumerable bilateral pulmonary nodules which is similar to prior study.  There is a new solid and groundglass nodular airspace disease throughout both lungs, right much greater than left.  Favor infectious or inflammatory process such as pneumonia, atypical/viral pneumonia. Large left hepatic mass again noted stable since prior study  Patient sees oncologist Dr. Alen Blew and was last seen by him on 06/06/2018.  Patient also had an evaluation at [facility].  She was recommended to start Sutent. Patient was started on Sutent 37.52 weeks on 1 week off last week.  Patient was seen at bedside.  She is breathing via nasal cannula oxygen.  Reports feeling slightly better compared to admission.  Denies any pain.   Review of Systems  Constitutional: Positive for fatigue. Negative for appetite change, chills and  fever.  HENT:   Negative for hearing loss and voice change.   Eyes: Negative for eye problems.  Respiratory: Positive for shortness of breath. Negative for chest tightness and cough.   Cardiovascular: Negative for chest pain.  Gastrointestinal: Negative for abdominal distention, abdominal pain and blood in stool.  Endocrine: Negative for hot flashes.  Genitourinary: Negative for difficulty urinating and frequency.   Musculoskeletal: Negative for arthralgias.  Skin: Negative for itching and rash.  Neurological: Negative for extremity weakness.  Hematological: Negative for adenopathy.  Psychiatric/Behavioral: Negative for confusion.    Allergies  Allergen Reactions   Budesonide-Formoterol Fumarate Other (See Comments)    (Symbicort) coughed worse   Lipitor [Atorvastatin] Other (See Comments)    Nosebleeds   Penicillins Rash    Did it involve swelling of the face/tongue/throat, SOB, or low BP? No Did it involve sudden or severe rash/hives, skin peeling, or any reaction on the inside of your mouth or nose? No Did you need to seek medical attention at a hospital or doctor's office? No When did it last happen?childhood allergy If all above answers are NO, may proceed with cephalosporin use.     Patient Active Problem List   Diagnosis Date Noted   Pressure injury of skin 06/26/2018   Severe sepsis ([place]) 06/25/2018   CAP (community acquired pneumonia) 06/25/2018   Acute on chronic renal failure ([place]) 06/25/2018   Calf swelling 06/23/2018   Metastatic renal cell carcinoma ([place]) 06/23/2018   On home oxygen therapy 05/11/2018   Chronic respiratory failure with hypoxia ([place]) 05/02/2018   Leg ulcer, right, limited to breakdown of skin ([place]) 05/02/2018   Hip fracture requiring operative repair, left, closed, initial encounter ([place]) 04/06/2018  Traumatic closed nondisplaced fracture of neck of left femur ([place]) 04/05/2018   Left hip pain 11/08/2017   CKD stage 3  due to type 2 diabetes mellitus ([place]) 01/26/2017   Advance directive discussed with patient 05/02/2015   Rectocele 05/01/2015   Uterine prolapse 05/01/2015   Vascular dementia without behavioral disturbance ([place])    Routine general medical examination at a health care facility 08/10/2010   Type 2 diabetes, controlled, with neuropathy ([place]) 05/16/2009   OSTEOARTHRITIS 05/16/2009   DIVERTICULOSIS-COLON 12/23/2008   COPD (chronic obstructive pulmonary disease) ([place]) 02/15/2008   HLD (hyperlipidemia) 12/22/2007   Episodic mood disorder ([place]) 12/22/2007   ALLERGIC RHINITIS 12/22/2007   Multiple pulmonary nodules 12/22/2007   GERD 12/22/2007   BREAST CANCER, HX OF 12/22/2007   COLONIC POLYPS, HX OF 12/22/2007     Past Medical History:  Diagnosis Date   Allergy    Anemia    Anxiety    Asthma    Blood transfusion without reported diagnosis    Breast cancer ([place])    h/o- radiation   Carcinoid tumor of lung 1991   Cataract    removed x 1    COPD (chronic obstructive pulmonary disease) ([place])    Depression    Diabetes mellitus type II    Diverticulosis    Dyspnea    with exertion    Gastroparesis    GERD (gastroesophageal reflux disease)    History of adenomatous polyp of colon    History of breast cancer    History of secondary kidney cancer    Hyperlipidemia    Liver cancer (HCC)    Mild cognitive impairment    Neuromuscular disorder (HCC)    neuropathy    Osteoarthritis    Oxygen deficiency    2 liters at bedtime    Pneumonia    Pulmonary nodule    multiple   Renal cell carcinoma      Past Surgical History:  Procedure Laterality Date   BIOPSY  05/16/2018   Procedure: BIOPSY;  Surgeon: Irving Copas., MD;  Location: [facility];  Service: Gastroenterology;;   BREAST LUMPECTOMY  1998   right breast   CHOLECYSTECTOMY  1975   COLONOSCOPY     COLONOSCOPY WITH PROPOFOL N/A 05/16/2018   Procedure: COLONOSCOPY  WITH PROPOFOL;  Surgeon: Irving Copas., MD;  Location: [facility];  Service: Gastroenterology;  Laterality: N/A;   HEMOSTASIS CLIP PLACEMENT  05/16/2018   Procedure: HEMOSTASIS CLIP PLACEMENT;  Surgeon: Irving Copas., MD;  Location: [facility];  Service: Gastroenterology;;   HIP FRACTURE SURGERY Left 03/2018   HIP PINNING,CANNULATED Left 04/06/2018   Procedure: CANNULATED SCREWS/HIP PINNING LEFT HIP;  Surgeon: Mcarthur Rossetti, MD;  Location: [facility];  Service: Orthopedics;  Laterality: Left;   LAPAROSCOPIC NEPHRECTOMY  01/2009   right   LUNG SURGERY  1994   carcinoid removal   POLYPECTOMY     POLYPECTOMY  05/16/2018   Procedure: POLYPECTOMY;  Surgeon: Mansouraty, Telford Nab., MD;  Location: [facility];  Service: Gastroenterology;;   TONSILLECTOMY AND ADENOIDECTOMY  1953    Social History   Socioeconomic History   Marital status: Married    Spouse name: Not on file   Number of children: 2   Years of education: Not on file   Highest education level: Not on file  Occupational History   Occupation: retired Designer, jewellery: [employer] resource strain: Not on file   Food insecurity:  Worry: Not on file    Inability: Not on file   Transportation needs:    Medical: Not on file    Non-medical: Not on file  Tobacco Use   Smoking status: Former Smoker    Packs/day: 1.00    Years: 30.00    Pack years: 30.00    Types: Cigarettes    Last attempt to quit: 03/08/1993    Years since quitting: 25.3   Smokeless tobacco: Never Used  Substance and Sexual Activity   Alcohol use: No   Drug use: No   Sexual activity: Not Currently    Birth control/protection: Post-menopausal  Lifestyle   Physical activity:    Days per week: Not on file    Minutes per session: Not on file   Stress: Not on file  Relationships   Social connections:    Talks on phone: Not on file    Gets together: Not on file    Attends  religious service: Not on file    Active member of club or organization: Not on file    Attends meetings of clubs or organizations: Not on file    Relationship status: Not on file   Intimate partner violence:    Fear of current or ex partner: Not on file    Emotionally abused: Not on file    Physically abused: Not on file    Forced sexual activity: Not on file  Other Topics Concern   Not on file  Social History Narrative   Has living will   Requests husband as health care POA--daughter [name] would be alternate   Now has DNR-- done 2/19   No tube feeds if cognitively unaware           Family History  Problem Relation Age of Onset   Colon cancer Father    Lung cancer Father    Stroke Mother    Breast cancer Neg Hx    Ovarian cancer Neg Hx    Diabetes Neg Hx    Colon polyps Neg Hx    Esophageal cancer Neg Hx    Rectal cancer Neg Hx    Stomach cancer Neg Hx      Current Facility-Administered Medications:    acetaminophen (TYLENOL) tablet 650 mg, 650 mg, Oral, Q6H PRN **OR** acetaminophen (TYLENOL) suppository 650 mg, 650 mg, Rectal, Q6H PRN, Lance Coon, MD   benzonatate (TESSALON) capsule 200 mg, 200 mg, Oral, TID PRN, Lance Coon, MD   bismuth subsalicylate (PEPTO BISMOL) 262 MG/15ML suspension 30 mL, 30 mL, Oral, Q6H PRN, Lance Coon, MD   ceFEPIme (MAXIPIME) 1 g in sodium chloride 0.9 % 100 mL IVPB, 1 g, Intravenous, Q12H, Lance Coon, MD, Last Rate: 200 mL/hr at 06/26/18 1709, 1 g at 06/26/18 1709   cholecalciferol (VITAMIN D3) tablet 1,000 Units, 1,000 Units, Oral, Daily, [place], Jude, MD, 1,000 Units at 06/26/18 1709   guaiFENesin-dextromethorphan (ROBITUSSIN DM) 100-10 MG/5ML syrup 5 mL, 5 mL, Oral, Q4H PRN, Lance Coon, MD   heparin injection 5,000 Units, 5,000 Units, Subcutaneous, Q8H, Lance Coon, MD, 5,000 Units at 06/26/18 1323   insulin aspart (novoLOG) injection 0-5 Units, 0-5 Units, Subcutaneous, QHS, Lance Coon, MD    insulin aspart (novoLOG) injection 0-9 Units, 0-9 Units, Subcutaneous, TID WC, Lance Coon, MD, 1 Units at 06/26/18 1714   ipratropium-albuterol (DUONEB) 0.5-2.5 (3) MG/3ML nebulizer solution 3 mL, 3 mL, Inhalation, Q6H PRN, Lance Coon, MD   mometasone-formoterol (DULERA) 200-5 MCG/ACT inhaler 2 puff, 2 puff, Inhalation, BID, Lance Coon,  MD, 2 puff at 06/26/18 0745   omega-3 acid ethyl esters (LOVAZA) capsule 1 g, 1 g, Oral, BID, Ojie, Jude, MD, 1 g at 06/26/18 1708   ondansetron (ZOFRAN) tablet 4 mg, 4 mg, Oral, Q6H PRN **OR** ondansetron (ZOFRAN) injection 4 mg, 4 mg, Intravenous, Q6H PRN, Lance Coon, MD   pravastatin (PRAVACHOL) tablet 40 mg, 40 mg, Oral, QHS, Lance Coon, MD, 40 mg at 06/25/18 2231   pregabalin (LYRICA) capsule 75 mg, 75 mg, Oral, BID, Lance Coon, MD, 75 mg at 06/26/18 0831   SUNItinib (SUTENT) capsule 37.5 mg, 37.5 mg, Oral, Daily, Lance Coon, MD, Stopped at 06/26/18 0831   tiotropium [facility]) inhalation capsule ([facility] use ONLY) 18 mcg, 18 mcg, Inhalation, Daily, Lance Coon, MD, 18 mcg at 06/26/18 0747   [START ON 06/27/2018] vancomycin (VANCOCIN) IVPB 1000 mg/200 mL premix, 1,000 mg, Intravenous, Q48H, Lance Coon, MD   Physical exam: Vitals:   06/25/18 2345 06/26/18 0338 06/26/18 0812 06/26/18 1536  BP:  (!) 123/98 (!) 152/93 99/66  Pulse:  99 90 92  Resp:  (!) 22 (!) 22 18  Temp:  98.4 F (36.9 C) 97.8 F (36.6 C) 98 F (36.7 C)  TempSrc:  Oral Oral Oral  SpO2: 98% 96% 98% 100%  Weight:      Height:       Physical Exam  Constitutional: No distress.  HENT:  Head: Normocephalic and atraumatic.  Eyes: Pupils are equal, round, and reactive to light. No scleral icterus.  Neck: Normal range of motion. Neck supple.  Cardiovascular: Normal rate and regular rhythm.  No murmur heard. Pulmonary/Chest: Effort normal. No respiratory distress.  Bilateral rhonchi Breathing via nasal cannula oxygen, mild respiratory distress  Abdominal:  Soft. She exhibits no distension. There is no abdominal tenderness.  Musculoskeletal: Normal range of motion.        General: No edema.  Neurological: She is alert. She exhibits normal muscle tone.  Skin: Skin is warm and dry. She is not diaphoretic. No erythema.        CMP Latest Ref Rng & Units 06/26/2018  Glucose 70 - 99 mg/dL 127(H)  BUN 8 - 23 mg/dL 17  Creatinine 0.44 - 1.00 mg/dL 1.52(H)  Sodium 135 - 145 mmol/L 140  Potassium 3.5 - 5.1 mmol/L 3.8  Chloride 98 - 111 mmol/L 105  CO2 22 - 32 mmol/L 26  Calcium 8.9 - 10.3 mg/dL 6.4(LL)  Total Protein 6.5 - 8.1 g/dL -  Total Bilirubin 0.3 - 1.2 mg/dL -  Alkaline Phos 38 - 126 U/L -  AST 15 - 41 U/L -  ALT 0 - 44 U/L -   CBC Latest Ref Rng & Units 06/26/2018  WBC 4.0 - 10.5 K/uL 29.1(H)  Hemoglobin 12.0 - 15.0 g/dL 9.4(L)  Hematocrit 36.0 - 46.0 % 31.3(L)  Platelets 150 - 400 K/uL 361  RADIOGRAPHIC STUDIES: I have personally reviewed the radiological images as listed and agreed with the findings in the report. Ct Angio Chest Pe W And/or Wo Contrast  Result Date: 06/25/2018 CLINICAL DATA:  Shortness of breath EXAM: CT ANGIOGRAPHY CHEST WITH CONTRAST TECHNIQUE: Multidetector CT imaging of the chest was performed using the standard protocol during bolus administration of intravenous contrast. Multiplanar CT image reconstructions and MIPs were obtained to evaluate the vascular anatomy. CONTRAST:  60mL OMNIPAQUE IOHEXOL 350 MG/ML SOLN COMPARISON:  Chest CT 05/23/2018 FINDINGS: Cardiovascular: No filling defects in the pulmonary arteries to suggest pulmonary emboli. Heart is normal size. Moderate coronary artery and aortic  atherosclerosis. No aneurysm. Mediastinum/Nodes: Borderline sized mediastinal lymph nodes. AP window lymph node has a short axis diameter of 10 mm, stable. Subcarinal lymph node has a short axis diameter of 7 mm, stable. Similarly sized pretracheal and paratracheal lymph nodes. Lungs/Pleura: Innumerable bilateral  pulmonary nodules are again noted as seen on recent chest CT. In addition, there are extensive new solid and ground-glass nodular airspace disease most pronounced throughout the right lung, also noted in the left lung to a lesser extent. No effusions. Upper Abdomen: Imaging into the upper abdomen shows no acute findings. The previously seen large left hepatic mass is again noted, unchanged. Musculoskeletal: Chest wall soft tissues are unremarkable. No acute bony abnormality. Review of the MIP images confirms the above findings. IMPRESSION: Innumerable bilateral pulmonary nodules, similar to prior study. Metastases not excluded as described on recent chest CT. New solid and ground-glass nodular airspace disease throughout both lungs, right much greater than left. Favor infectious or inflammatory process such as pneumonia, atypical/viral pneumonia. Drug reaction felt less likely but not excluded. Large left hepatic mass again noted, stable since prior study, better seen on prior study. Three-vessel coronary artery disease Aortic Atherosclerosis (ICD10-I70.0). Electronically Signed   By: Rolm Baptise M.D.   On: 06/25/2018 19:59   US Venous Img Lower Unilateral Right  Result Date: 06/23/2018 CLINICAL DATA:  73 year old female with leg swelling EXAM: RIGHT LOWER EXTREMITY VENOUS DOPPLER ULTRASOUND TECHNIQUE: Gray-scale sonography with graded compression, as well as color Doppler and duplex ultrasound were performed to evaluate the lower extremity deep venous systems from the level of the common femoral vein and including the common femoral, femoral, profunda femoral, popliteal and calf veins including the posterior tibial, peroneal and gastrocnemius veins when visible. The superficial great saphenous vein was also interrogated. Spectral Doppler was utilized to evaluate flow at rest and with distal augmentation maneuvers in the common femoral, femoral and popliteal veins. COMPARISON:  None. FINDINGS: Contralateral  Common Femoral Vein: Respiratory phasicity is normal and symmetric with the symptomatic side. No evidence of thrombus. Normal compressibility. Common Femoral Vein: No evidence of thrombus. Normal compressibility, respiratory phasicity and response to augmentation. Saphenofemoral Junction: No evidence of thrombus. Normal compressibility and flow on color Doppler imaging. Profunda Femoral Vein: No evidence of thrombus. Normal compressibility and flow on color Doppler imaging. Femoral Vein: No evidence of thrombus. Normal compressibility, respiratory phasicity and response to augmentation. Popliteal Vein: No evidence of thrombus. Normal compressibility, respiratory phasicity and response to augmentation. Calf Veins: Peroneal vein not visualized. No thrombus of the posterior tibial vein Superficial Great Saphenous Vein: No evidence of thrombus. Normal compressibility and flow on color Doppler imaging. Other Findings:  Edema IMPRESSION: Sonographic survey of the right lower extremity negative for DVT. Edema Electronically Signed   By: Corrie Mckusick D.O.   On: 06/23/2018 15:19   Dg Chest Port 1 View  Result Date: 06/25/2018 CLINICAL DATA:  Acute shortness of breath. History of metastatic renal cell carcinoma. EXAM: PORTABLE CHEST 1 VIEW COMPARISON:  05/23/2018 CT and 04/08/2018 chest radiograph FINDINGS: Cardiomediastinal silhouette is unremarkable. New confluent and nodular opacities throughout the RIGHT lung noted. LEFT basilar atelectasis/scarring again noted. No pleural effusion or pneumothorax. IMPRESSION: New confluent and nodular opacities throughout the RIGHT lung which may represent increasing metastatic disease and/or infection. Electronically Signed   By: Margarette Canada M.D.   On: 06/25/2018 18:01    Assessment and plan- Patient is a 73 y.o. female with history of stage IV renal cell carcinoma, recently started on Sutent  presented to ER for evaluation of worsening shortness of breath, work-up showed  elevated lactic acid.  CT findings showed worsening bilateral groundglass opacities, right greater than left.  #Acute on chronic respiratory failure, likely due to community-acquired pneumonia. Continue IV antibiotics. CT chest angiogram was independently reviewed by me and discussed with patient.  Acute CT findings favoring acute infection/inflammatory process i.e. pneumonia. Her procalcitonin level is also elevated at 12.48, consistent with acute infection. Differential metastatic lung disease progression, less likely. hepatic mass remains unchanged comparing to prior study in March. Recommend continue treating presumed community-acquired pneumonia.  #Metastatic renal cell carcinoma, recently started on Sutent 37.5 milligrams 2 weeks on 1 week off. Recommend to hold Sutent during acute illness.  Patient can follow-up outpatient with oncologist and resume at that point.  Code Status, DNR  Dr.Willis, Thank you for allowing me to participate in the care of this patient.  Total face to face encounter time for this patient visit was 70 min. >50% of the time was  spent in counseling and coordination of care.   Cc Dr.Shadad.   Earlie Server, MD, PhD Hematology Oncology [facility] Pager- [phone] 06/26/2018

## 2018-06-27 ENCOUNTER — Telehealth: Payer: Self-pay

## 2018-06-27 ENCOUNTER — Telehealth: Payer: Self-pay | Admitting: Internal Medicine

## 2018-06-27 ENCOUNTER — Telehealth: Payer: Self-pay | Admitting: Pharmacist

## 2018-06-27 LAB — COMPREHENSIVE METABOLIC PANEL
ALT: 11 U/L (ref 0–44)
AST: 14 U/L — ABNORMAL LOW (ref 15–41)
Albumin: 2.1 g/dL — ABNORMAL LOW (ref 3.5–5.0)
Alkaline Phosphatase: 66 U/L (ref 38–126)
Anion gap: 8 (ref 5–15)
BUN: 20 mg/dL (ref 8–23)
CO2: 25 mmol/L (ref 22–32)
Calcium: 7 mg/dL — ABNORMAL LOW (ref 8.9–10.3)
Chloride: 109 mmol/L (ref 98–111)
Creatinine, Ser: 1.29 mg/dL — ABNORMAL HIGH (ref 0.44–1.00)
GFR calc Af Amer: 47 mL/min — ABNORMAL LOW (ref >60)
GFR calc non Af Amer: 41 mL/min — ABNORMAL LOW (ref >60)
Glucose, Bld: 139 mg/dL — ABNORMAL HIGH (ref 70–99)
Potassium: 3.1 mmol/L — ABNORMAL LOW (ref 3.5–5.1)
Sodium: 142 mmol/L (ref 135–145)
Total Bilirubin: 0.4 mg/dL (ref 0.3–1.2)
Total Protein: 5.1 g/dL — ABNORMAL LOW (ref 6.5–8.1)

## 2018-06-27 LAB — CBC WITH DIFFERENTIAL/PLATELET
Abs Immature Granulocytes: 0.48 10*3/uL — ABNORMAL HIGH (ref 0.00–0.07)
Basophils Absolute: 0 10*3/uL (ref 0.0–0.1)
Basophils Relative: 0 %
Eosinophils Absolute: 0 10*3/uL (ref 0.0–0.5)
Eosinophils Relative: 0 %
HCT: 28.8 % — ABNORMAL LOW (ref 36.0–46.0)
Hemoglobin: 8.5 g/dL — ABNORMAL LOW (ref 12.0–15.0)
Immature Granulocytes: 2 %
Lymphocytes Relative: 8 %
Lymphs Abs: 1.9 10*3/uL (ref 0.7–4.0)
MCH: 25.5 pg — ABNORMAL LOW (ref 26.0–34.0)
MCHC: 29.5 g/dL — ABNORMAL LOW (ref 30.0–36.0)
MCV: 86.5 fL (ref 80.0–100.0)
Monocytes Absolute: 0.4 10*3/uL (ref 0.1–1.0)
Monocytes Relative: 2 %
Neutro Abs: 19.3 10*3/uL — ABNORMAL HIGH (ref 1.7–7.7)
Neutrophils Relative %: 88 %
Platelets: 329 10*3/uL (ref 150–400)
RBC: 3.33 MIL/uL — ABNORMAL LOW (ref 3.87–5.11)
RDW: 15.6 % — ABNORMAL HIGH (ref 11.5–15.5)
WBC: 22.1 10*3/uL — ABNORMAL HIGH (ref 4.0–10.5)
nRBC: 0 % (ref 0.0–0.2)

## 2018-06-27 LAB — GLUCOSE, CAPILLARY
Glucose-Capillary: 114 mg/dL — ABNORMAL HIGH (ref 70–99)
Glucose-Capillary: 176 mg/dL — ABNORMAL HIGH (ref 70–99)
Glucose-Capillary: 130 mg/dL — ABNORMAL HIGH (ref 70–99)
Glucose-Capillary: 167 mg/dL — ABNORMAL HIGH (ref 70–99)

## 2018-06-27 LAB — MRSA PCR SCREENING: MRSA by PCR: NEGATIVE

## 2018-06-27 LAB — MAGNESIUM: Magnesium: 1.1 mg/dL — ABNORMAL LOW (ref 1.7–2.4)

## 2018-06-27 MED ORDER — SODIUM CHLORIDE 0.9 % IV SOLN
2.0000 g | Freq: Two times a day (BID) | INTRAVENOUS | Status: DC
  Administered 2018-06-27 – 2018-06-28 (×2): 2 g via INTRAVENOUS
  Filled 2018-06-27 (×3): qty 2

## 2018-06-27 MED ORDER — HALOPERIDOL LACTATE 5 MG/ML IJ SOLN
1.0000 mg | Freq: Once | INTRAMUSCULAR | Status: DC
  Filled 2018-06-27: qty 1

## 2018-06-27 MED ORDER — POTASSIUM CHLORIDE 20 MEQ PO PACK
40.0000 meq | PACK | Freq: Once | ORAL | Status: AC
  Administered 2018-06-27: 12:00:00 40 meq via ORAL
  Filled 2018-06-27: qty 2

## 2018-06-27 MED ORDER — MAGNESIUM SULFATE 4 GM/100ML IV SOLN
4.0000 g | Freq: Once | INTRAVENOUS | Status: AC
  Administered 2018-06-27: 4 g via INTRAVENOUS
  Filled 2018-06-27: qty 100

## 2018-06-27 MED ORDER — QUETIAPINE FUMARATE 25 MG PO TABS
12.5000 mg | ORAL_TABLET | Freq: Two times a day (BID) | ORAL | Status: DC | PRN
  Administered 2018-06-27 – 2018-06-28 (×3): 12.5 mg via ORAL
  Filled 2018-06-27 (×3): qty 1

## 2018-06-27 MED ORDER — VANCOMYCIN HCL 10 G IV SOLR
1250.0000 mg | INTRAVENOUS | Status: DC
  Filled 2018-06-27: qty 1250

## 2018-06-27 MED ORDER — SODIUM CHLORIDE 0.9 % IV SOLN
INTRAVENOUS | Status: DC | PRN
  Administered 2018-06-27 (×2): 1000 mL via INTRAVENOUS

## 2018-06-27 NOTE — Plan of Care (Signed)
PMT note:  Reviewed notes with plans already in place for home with hospice. Consult placed to arrange home with hospice. Followed up with CM, plans for D/C with hospice. Palliative NP Cousar has spoken with D.r Stark Jock, no need to see patient.

## 2018-06-27 NOTE — Telephone Encounter (Signed)
Per Dr. Hazeline Junker recommendation to pursue hospice care this RN contacted the patient spouse [name] and had this conversation. [name] stated that the patient currently remains admitted in [facility] but he would like for her to come home as soon as possible since she has underlying dementia and he is concerned since he cannot be present. Discussed hospice services in the home and he stated that he would like the patient to come home with hospice services. Contacted [facility] case manger Delilah with hospice recommendation per Dr. Alen Blew and she stated that she would make the referral. Spoke with spouse [name] and made aware that he should receive a call with follow up regarding the hospice referral.

## 2018-06-27 NOTE — Progress Notes (Addendum)
[place] [facility] [place] NAME: [name]    MR#:  [id number]  DATE OF BIRTH:  22-Sep-1945  SUBJECTIVE:   Chief Complaint  Patient presents with  . Shortness of Breath   Patient still with sob with minimal exertion. She remains on oxygen via nasal cannula. Patient state that she would like to get up and "walk around her kitchen".  REVIEW OF SYSTEMS:  Review of Systems  Constitutional: Positive for malaise/fatigue and weight loss. Negative for chills and fever.  HENT: Negative for sore throat.   Respiratory: Positive for shortness of breath and wheezing. Negative for cough.   Cardiovascular: Positive for orthopnea and leg swelling. Negative for chest pain.  Gastrointestinal: Negative for abdominal pain, diarrhea, nausea and vomiting.  Genitourinary: Negative for dysuria.  Musculoskeletal: Negative for back pain and myalgias.  Neurological: Negative for dizziness, sensory change, speech change, focal weakness and headaches.   DRUG ALLERGIES:   Allergies  Allergen Reactions  . Budesonide-Formoterol Fumarate Other (See Comments)    (Symbicort) coughed worse  . Lipitor [Atorvastatin] Other (See Comments)    Nosebleeds  . Penicillins Rash    Did it involve swelling of the face/tongue/throat, SOB, or low BP? No Did it involve sudden or severe rash/hives, skin peeling, or any reaction on the inside of your mouth or nose? No Did you need to seek medical attention at a hospital or doctor's office? No When did it last happen?childhood allergy If all above answers are "NO", may proceed with cephalosporin use.    VITALS:  Blood pressure 122/68, pulse 88, temperature 97.7 F (36.5 C), temperature source Axillary, resp. rate 17, height 5\' 3"  (1.6 m), weight 57.2 kg, SpO2 100 %. PHYSICAL EXAMINATION:  GENERAL:  72y.o.-year-old pale appearing patient lying in the bed with no acute distress.  EYES: Pupils equal, round, reactive to light  and accommodation. No scleral icterus. Extraocular muscles intact.  HEENT: Head atraumatic, normocephalic. Oropharynx and nasopharynx clear.  NECK:  Supple, no jugular venous distention. No thyroid enlargement, no tenderness.  LUNGS: Abnormal breath sounds bilaterally, diminished breath sound in bilateral lower lobes.  No wheezing, rales,rhonchi or crepitation. Mild use of accessory muscles of respiration.  CARDIOVASCULAR: S1, S2 normal. No murmurs, rubs, or gallops.  ABDOMEN: Soft, nontender, nondistended. Bowel sounds present. No organomegaly or mass.  EXTREMITIES: Mild bilateral pedal edema, No cyanosis, or clubbing.  NEUROLOGIC: Mental status: Alert, oriented to self only, disoriented to time, place and situation. thought content inappropriate.  Speech fluent without evidence of aphasia.  Able to follow 3 step commands without difficulty. Attention span and concentration seemed mildly impaired. Cranial nerves II through XII are intact. Muscle strength 5/5 in all extremities. Sensation intact. Gait not checked.  PSYCHIATRIC: The patient is alert and oriented to self only.  SKIN: No obvious rash, lesion, or ulcer.   DATA REVIEWED:  LABORATORY PANEL:  Female CBC Recent Labs  Lab 06/27/18 0313  WBC 22.1*  HGB 8.5*  HCT 28.8*  PLT 329   ------------------------------------------------------------------------------------------------------------------ Chemistries  Recent Labs  Lab 06/27/18 0313  NA 142  K 3.1*  CL 109  CO2 25  GLUCOSE 139*  BUN 20  CREATININE 1.29*  CALCIUM 7.0*  MG 1.1*  AST 14*  ALT 11  ALKPHOS 66  BILITOT 0.4   RADIOLOGY:  Ct Angio Chest Pe W And/or Wo Contrast  Result Date: 06/25/2018 CLINICAL DATA:  Shortness of breath EXAM: CT ANGIOGRAPHY CHEST WITH CONTRAST TECHNIQUE: Multidetector CT imaging  of the chest was performed using the standard protocol during bolus administration of intravenous contrast. Multiplanar CT image reconstructions and MIPs were  obtained to evaluate the vascular anatomy. CONTRAST:  50mL OMNIPAQUE IOHEXOL 350 MG/ML SOLN COMPARISON:  Chest CT 05/23/2018 FINDINGS: Cardiovascular: No filling defects in the pulmonary arteries to suggest pulmonary emboli. Heart is normal size. Moderate coronary artery and aortic atherosclerosis. No aneurysm. Mediastinum/Nodes: Borderline sized mediastinal lymph nodes. AP window lymph node has a short axis diameter of 10 mm, stable. Subcarinal lymph node has a short axis diameter of 7 mm, stable. Similarly sized pretracheal and paratracheal lymph nodes. Lungs/Pleura: Innumerable bilateral pulmonary nodules are again noted as seen on recent chest CT. In addition, there are extensive new solid and ground-glass nodular airspace disease most pronounced throughout the right lung, also noted in the left lung to a lesser extent. No effusions. Upper Abdomen: Imaging into the upper abdomen shows no acute findings. The previously seen large left hepatic mass is again noted, unchanged. Musculoskeletal: Chest wall soft tissues are unremarkable. No acute bony abnormality. Review of the MIP images confirms the above findings. IMPRESSION: Innumerable bilateral pulmonary nodules, similar to prior study. Metastases not excluded as described on recent chest CT. New solid and ground-glass nodular airspace disease throughout both lungs, right much greater than left. Favor infectious or inflammatory process such as pneumonia, atypical/viral pneumonia. Drug reaction felt less likely but not excluded. Large left hepatic mass again noted, stable since prior study, better seen on prior study. Three-vessel coronary artery disease Aortic Atherosclerosis (ICD10-I70.0). Electronically Signed   By: Rolm Baptise M.D.   On: 06/25/2018 19:59   Dg Chest Port 1 View  Result Date: 06/25/2018 CLINICAL DATA:  Acute shortness of breath. History of metastatic renal cell carcinoma. EXAM: PORTABLE CHEST 1 VIEW COMPARISON:  05/23/2018 CT and 04/08/2018  chest radiograph FINDINGS: Cardiomediastinal silhouette is unremarkable. New confluent and nodular opacities throughout the RIGHT lung noted. LEFT basilar atelectasis/scarring again noted. No pleural effusion or pneumothorax. IMPRESSION: New confluent and nodular opacities throughout the RIGHT lung which may represent increasing metastatic disease and/or infection. Electronically Signed   By: Margarette Canada M.D.   On: 06/25/2018 18:01   ASSESSMENT AND PLAN:   73 y.o. female with past medical history of type 2 diabetes, COPD O2 dependent, CKD, hyperlipidemia, GERD and recent diagnosis of metastatic renal cell carcinoma presenting with complaints of shortness of breath.  # 1. Acute on chronic hypoxic respiratory failure secondary to community acquired pneumonia: Patient presented with progressive shortness of breath requiring supplemental oxygen.On oxygen at home -Supplemental O2 for dyspnea and/or hypoxia   -Scheduled and prn bronchodilator therapy Nebulized steroids    2. Community-acquired pneumonia- CT angio chest shows new solid and ground-glass nodular airspace disease throughout both lungs, right much greater than left. Lactic acid levels back to normal, WBC  Improving from 29.1 down to 22.1 -Patient on IV abx Cefepime and Vanc -IVFs -Blood cultures so far no growth -Ruled out COVID-19 infection  3. COPD (chronic obstructive pulmonary disease) (HCC) -home dose inhalers, additional PRN supportive inhalers as above  4. Acute on chronic renal failure (HCC) -Acute phase multifactorial causes in the setting of hypercalcemia, infection, dehydration and chemo drug -Kidney functions improving -IV fluids as above, -avoid nephrotoxins and monitor for expected improvement -Will continue to follow labs  5.Type 2 diabetes, controlled, with neuropathy (HCC)  -sliding scale insulin coverage with carb modified diet  6. Metastatic renal cell carcinoma (HCC) -patient recently started sunitinib per chart  review.  -Oncology consult appreciated -Per oncology Sutent stopped as family would wish to proceed with home hospice. -Will place palliative consult  7. HLD (hyperlipidemia) -home dose antilipid  8. GERD -home dose PPI  9. Hypocalcemia- Improving -Repleted with calcium gluconate -Recheck labs in am  10. Hypokalemia - On supplements -Recheck in am  11. Hypomagnesia -IV replacement -Recheck labs in am    All the records are reviewed and case discussed with Care Management/Social Worker. Management plans discussed with the patient, family and they are in agreement.  CODE STATUS: DNR  TOTAL TIME TAKING CARE OF THIS PATIENT: 38 minutes.   More than 50% of the time was spent in counseling/coordination of care: YES  POSSIBLE D/C IN 1.2 DAYS, DEPENDING ON CLINICAL CONDITION.   on 06/27/2018 at 9:49 AM  This patient was staffed with Dr. Stark Jock, [place] who personally evaluated patient, reviewed documentation and agreed with assessment and plan of care as above.  Rufina Falco, DNP, FNP-BC [facility] Nurse Practitioner   Between 7am to 6pm - Pager [phone]  After 6pm go to [email or web address] - Proofreader  [facility] [place] [facility]  Office  [phone]  CC: Primary care physician; Venia Carbon, MD  Note: This dictation was prepared with Dragon dictation along with smaller phrase technology. Any transcriptional errors that result from this process are unintentional.

## 2018-06-27 NOTE — TOC Initial Note (Signed)
Transition of Care [facility]) - Initial/Assessment Note    Patient Details  Name: [name] MRN: [id number] Date of Birth: 1945-07-06  Transition [facility] Care [facility]) CM/SW Contact:    Annamaria Boots, [place] Phone Number: 06/27/2018, 12:18 PM  Clinical Narrative: Patient lives at home with husband. CSW spoke with patient's husband and he would like patient to come home with hospice. CSW offered choice to patient's husband and he referred CSW to daughter. CSW spoke with patient's daughter who chose [facility] hospice services. CSW notified Santiago Glad, hospice liaison of referral. CSW will continue to follow.                   Expected Discharge Plan: Home w Hospice Care Barriers to Discharge: Continued Medical Work up   Patient Goals and CMS Choice Patient states their goals for this hospitalization and ongoing recovery are:: " I just want her to be comfortable" CMS Medicare.gov Compare Post Acute Care list provided to:: Patient Represenative (must comment)(Husband ) Choice offered to / list presented to : Spouse  Expected Discharge Plan and Services Expected Discharge Plan: [facility] Acute Care Choice: Hospice Living arrangements for the past 2 months: Single Family Home                          Prior Living Arrangements/Services Living arrangements for the past 2 months: Single Family Home Lives with:: Spouse Patient language and need for interpreter reviewed:: Yes Do you feel safe going back to the place where you live?: Yes      Need for Family Participation in Patient Care: Yes (Comment) Care giver support system in place?: Yes (comment)   Criminal Activity/Legal Involvement Pertinent to Current Situation/Hospitalization: No - Comment as needed  Activities of Daily Living Home Assistive Devices/Equipment: Cane (specify quad or straight) ADL Screening (condition at time of admission) Patient's cognitive ability adequate to safely complete daily  activities?: No Is the patient deaf or have difficulty hearing?: No Does the patient have difficulty seeing, even when wearing glasses/contacts?: No Does the patient have difficulty concentrating, remembering, or making decisions?: Yes Patient able to express need for assistance with ADLs?: Yes Does the patient have difficulty dressing or bathing?: Yes Independently performs ADLs?: Yes (appropriate for developmental age) Does the patient have difficulty walking or climbing stairs?: Yes Weakness of Legs: Both Weakness of Arms/Hands: Both  Permission Sought/Granted Permission sought to share information with : Case Manager, Customer service manager, Family Supports Permission granted to share information with : Yes, Verbal Permission Granted              Emotional Assessment Appearance:: Appears stated age     Orientation: : Oriented to Self Alcohol / Substance Use: Not Applicable Psych Involvement: No (comment)  Admission diagnosis:  short of breath Patient Active Problem List   Diagnosis Date Noted  . Pressure injury of skin 06/26/2018  . Severe sepsis ([place]) 06/25/2018  . CAP (community acquired pneumonia) 06/25/2018  . Acute on chronic renal failure ([place]) 06/25/2018  . Calf swelling 06/23/2018  . Metastatic renal cell carcinoma ([place]) 06/23/2018  . On home oxygen therapy 05/11/2018  . Chronic respiratory failure with hypoxia ([place]) 05/02/2018  . Leg ulcer, right, limited to breakdown of skin ([place]) 05/02/2018  . Hip fracture requiring operative repair, left, closed, initial encounter ([place]) 04/06/2018  . Traumatic closed nondisplaced fracture of neck of left femur ([place]) 04/05/2018  . Left  hip pain 11/08/2017  . CKD stage 3 due to type 2 diabetes mellitus ([place]) 01/26/2017  . Advance directive discussed with patient 05/02/2015  . Rectocele 05/01/2015  . Uterine prolapse 05/01/2015  . Vascular dementia without behavioral disturbance ([place])   . Routine general medical  examination at a health care facility 08/10/2010  . Type 2 diabetes, controlled, with neuropathy ([place]) 05/16/2009  . OSTEOARTHRITIS 05/16/2009  . DIVERTICULOSIS-COLON 12/23/2008  . COPD (chronic obstructive pulmonary disease) ([place]) 02/15/2008  . HLD (hyperlipidemia) 12/22/2007  . Episodic mood disorder ([place]) 12/22/2007  . ALLERGIC RHINITIS 12/22/2007  . Multiple pulmonary nodules 12/22/2007  . GERD 12/22/2007  . BREAST CANCER, HX OF 12/22/2007  . COLONIC POLYPS, HX OF 12/22/2007   PCP:  Venia Carbon, MD Pharmacy:   [id number] [address], [place] [address] Phone: [phone] Fax: [phone]  [facility] [place] [address] to [address] [place] [address] Phone: [phone] Fax: [place], [place] [address] [place] [address] Phone: [phone] Fax: [phone]     Social Determinants of Health (SDOH) Interventions    Readmission Risk Interventions No flowsheet data found.

## 2018-06-27 NOTE — Progress Notes (Signed)
New referral for [facility] services at home received from [place]. Patient is a 73 year old woman, admitted to [facility] from home on 4/19 for treatment of community acquired PNA. She has a known history of stage 3 CKD, metastatic kidney ca with newly diagnosed liver mets,Oxygn dependent COPD, DM II, vascular dementia, HLD, depression, breast Ca and neuropathy. She was recently started on an oral chemo. Per chart note review, plan is for chemo to be  discontinued and for patient to discharge home with the support of hospice services.  Writer spoke via telephone to patient's husband [name] to initiate education regarding hospice services, philosophy and team approach to care with understanding voiced. Per [name], patient does not need any DME prior to discharge. She currently has oxygen in the home and her baseline is 3 liters via nasal cannula. He does not feel that she requires a hospital bed at this time. Discussed discharge timing, [name] is ready for his wife to come home anytime, he did voice understanding that staying at [facility] for one more day of IV antibiotics would be of benefit. Writer then spoke to attending physician and NP, plan is for discharge tomorrow. NP Benjamine Mola to contact [facility] to advise. Patient seen sitting up in bed, alert and able to Recruitment consultant. She reported that her breathing "was better" and denied pain, she is currently on 2 liters. Patient did appear to have some dyspnea when talking. She also required a PRN dose of Seroquel this morning d/t agitation. Patient information faxed to referral. Hospital care team updated. Will continue to follow through discharge. Flo Shanks BSN, RN, [facility] [phone]

## 2018-06-27 NOTE — Plan of Care (Signed)

## 2018-06-27 NOTE — Plan of Care (Signed)
Pt confused and needs to be redirected. Pt reminded of safety precautions and bed alarm on. Pdowless, rn

## 2018-06-27 NOTE — TOC Progression Note (Signed)
Transition of Care [facility]) - Progression Note    Patient Details  Name: [name] MRN: [id number] Date of Birth: October 12, 1945  Transition of Care [facility]) CM/SW [place], RN Phone Number: 06/27/2018, 9:20 AM  Clinical Narrative:     Patient's PCP office called and requested that we do a Hospice for home consult, they would like the patient to be followed by hospice once they DC       Expected Discharge Plan and Services                                     Social Determinants of Health (SDOH) Interventions    Readmission Risk Interventions No flowsheet data found.

## 2018-06-27 NOTE — Telephone Encounter (Signed)
Oral Oncology Pharmacist Encounter  Received call from patient's husband, Kasandra Knudsen, this morning with information that patient remains admitted. He does not think patient will be able to tolerate Sutent in any way. Sutent initiated on 06/20/18 Mr. Knoedler stated he sent non-urgent message through My Chart to Dr. Alen Blew this weekend to inform him of this. Per Epic, MD is aware of patient's current status per notes on 4/20 and has recommended to stop Sutent and consider Hospice. I told Mr. Channing he should be receiving a call today from Dr. Hazeline Junker collaborative practice RN with this information.   Johny Drilling, PharmD, BCPS, BCOP  06/27/2018 8:19 AM Oral Oncology Clinic [phone]

## 2018-06-27 NOTE — Telephone Encounter (Signed)
Copied from [place] [phone] [phone]. Topic: Quick Communication - Home Health Verbal Orders >> Jun 27, 2018  4:31 PM Berneta Levins wrote: Alyse Low from [facility] calling.  States pt will be discharged from the hospital tomorrow with Hospice orders and they  need to know if Dr. Silvio Pate with be Hospice attending from that point. Alyse Low can be reached at [phone]

## 2018-06-27 NOTE — Telephone Encounter (Signed)
I agree. I would recommend stopping Sutent and proceeding with hospice.

## 2018-06-27 NOTE — Progress Notes (Signed)
Pharmacy Antibiotic Note  [name] is a 73 y.o. female admitted on 06/25/2018 with sepsis.  Pharmacy has been consulted for Vancomycin, Cefepime dosing.  Plan: 1) Will increase Cefepime to 2g Q 12 hours as renal function and CrCl has improved to> 30 ml/min  2) Will increase Vancomycin dose to 1250mg  Q 48 hours. Will order a random level for 4/22 and adjust dose as deemed necessary. AUC = 494.8 Vanc trough = 9.5 T1/2: 22.6 hours  Height: 5\' 3"  (160 cm) Weight: 126 lb (57.2 kg) IBW/kg (Calculated) : 52.4  Temp (24hrs), Avg:97.9 F (36.6 C), Min:97.7 F (36.5 C), Max:98 F (36.7 C)  Recent Labs  Lab 06/25/18 1725 06/25/18 2039 06/26/18 0046 06/26/18 0323 06/27/18 0313  WBC 27.7*  --  29.1*  --  22.1*  CREATININE 1.67*  --  1.52*  --  1.29*  LATICACIDVEN 2.7* 4.0* 2.2* 1.8  --     Estimated Creatinine Clearance: 31.6 mL/min (A) (by C-G formula based on SCr of 1.29 mg/dL (H)).    Allergies  Allergen Reactions  . Budesonide-Formoterol Fumarate Other (See Comments)    (Symbicort) coughed worse  . Lipitor [Atorvastatin] Other (See Comments)    Nosebleeds  . Penicillins Rash    Did it involve swelling of the face/tongue/throat, SOB, or low BP? No Did it involve sudden or severe rash/hives, skin peeling, or any reaction on the inside of your mouth or nose? No Did you need to seek medical attention at a hospital or doctor's office? No When did it last happen?childhood allergy If all above answers are "NO", may proceed with cephalosporin use.     Antimicrobials this admission:  Cefepime 4/19 >>   Vancomycin 4/19 >>  Dose adjustments this admission:   Microbiology results: 4/19 BCx: NGTD 4/19 COVID: negative 4/20 MRSA PCR: pending  Thank you for allowing pharmacy to be a part of this patient's care.  Pearla Dubonnet, PharmD Clinical Pharmacist 06/27/2018 7:07 AM

## 2018-06-28 ENCOUNTER — Telehealth: Payer: Self-pay

## 2018-06-28 LAB — CBC WITH DIFFERENTIAL/PLATELET
Abs Immature Granulocytes: 0.2 10*3/uL — ABNORMAL HIGH (ref 0.00–0.07)
Basophils Absolute: 0 10*3/uL (ref 0.0–0.1)
Basophils Relative: 0 %
Eosinophils Absolute: 0 10*3/uL (ref 0.0–0.5)
Eosinophils Relative: 0 %
HCT: 28.9 % — ABNORMAL LOW (ref 36.0–46.0)
Hemoglobin: 8.6 g/dL — ABNORMAL LOW (ref 12.0–15.0)
Immature Granulocytes: 1 %
Lymphocytes Relative: 12 %
Lymphs Abs: 2 10*3/uL (ref 0.7–4.0)
MCH: 25.6 pg — ABNORMAL LOW (ref 26.0–34.0)
MCHC: 29.8 g/dL — ABNORMAL LOW (ref 30.0–36.0)
MCV: 86 fL (ref 80.0–100.0)
Monocytes Absolute: 0.3 10*3/uL (ref 0.1–1.0)
Monocytes Relative: 2 %
Neutro Abs: 14.8 10*3/uL — ABNORMAL HIGH (ref 1.7–7.7)
Neutrophils Relative %: 85 %
Platelets: 327 10*3/uL (ref 150–400)
RBC: 3.36 MIL/uL — ABNORMAL LOW (ref 3.87–5.11)
RDW: 15.6 % — ABNORMAL HIGH (ref 11.5–15.5)
WBC: 17.3 10*3/uL — ABNORMAL HIGH (ref 4.0–10.5)
nRBC: 0 % (ref 0.0–0.2)

## 2018-06-28 LAB — GLUCOSE, CAPILLARY
Glucose-Capillary: 143 mg/dL — ABNORMAL HIGH (ref 70–99)
Glucose-Capillary: 167 mg/dL — ABNORMAL HIGH (ref 70–99)

## 2018-06-28 LAB — BASIC METABOLIC PANEL
Anion gap: 9 (ref 5–15)
BUN: 21 mg/dL (ref 8–23)
CO2: 27 mmol/L (ref 22–32)
Calcium: 8 mg/dL — ABNORMAL LOW (ref 8.9–10.3)
Chloride: 109 mmol/L (ref 98–111)
Creatinine, Ser: 1.27 mg/dL — ABNORMAL HIGH (ref 0.44–1.00)
GFR calc Af Amer: 48 mL/min — ABNORMAL LOW (ref >60)
GFR calc non Af Amer: 42 mL/min — ABNORMAL LOW (ref >60)
Glucose, Bld: 143 mg/dL — ABNORMAL HIGH (ref 70–99)
Potassium: 3.9 mmol/L (ref 3.5–5.1)
Sodium: 145 mmol/L (ref 135–145)

## 2018-06-28 LAB — MAGNESIUM: Magnesium: 2.3 mg/dL (ref 1.7–2.4)

## 2018-06-28 MED ORDER — QUETIAPINE FUMARATE 25 MG PO TABS: 12.5000 mg | ORAL_TABLET | Freq: Every evening | ORAL | 0 refills | Status: AC | PRN

## 2018-06-28 MED ORDER — AZITHROMYCIN 500 MG PO TABS: 500.0000 mg | ORAL_TABLET | Freq: Every day | ORAL | 0 refills | Status: DC

## 2018-06-28 NOTE — Care Management Important Message (Signed)
Important Message  Patient Details  Name: [name] MRN: [id number] Date of Birth: 03-01-1946   Medicare Important Message Given:  Yes    [name] 06/28/2018, 10:41 AM

## 2018-06-28 NOTE — Discharge Summary (Addendum)
[facility] NAME: [name]    MR#:  [id number]  DATE OF BIRTH:  30-Dec-1945  DATE OF ADMISSION:  06/25/2018   ADMITTING PHYSICIAN: Lance Coon, MD  DATE OF DISCHARGE: No discharge date for patient encounter.  PRIMARY CARE PHYSICIAN: Venia Carbon, MD   ADMISSION DIAGNOSIS:   short of breath  DISCHARGE DIAGNOSIS:   Principal Problem:   Severe sepsis ([place]) Active Problems:   Type 2 diabetes, controlled, with neuropathy ([place])   HLD (hyperlipidemia)   COPD (chronic obstructive pulmonary disease) (HCC)   GERD   Chronic respiratory failure with hypoxia (HCC)   Metastatic renal cell carcinoma (HCC)   CAP (community acquired pneumonia)   Acute on chronic renal failure ([place])   Pressure injury of skin   SECONDARY DIAGNOSIS:   Past Medical History:  Diagnosis Date  . Allergy   . Anemia   . Anxiety   . Asthma   . Blood transfusion without reported diagnosis   . Breast cancer [facility])    h/o- radiation  . Carcinoid tumor of lung 1991  . Cataract    removed x 1   . COPD (chronic obstructive pulmonary disease) ([place])   . Depression   . Diabetes mellitus type II   . Diverticulosis   . Dyspnea    with exertion   . Gastroparesis   . GERD (gastroesophageal reflux disease)   . History of adenomatous polyp of colon   . History of breast cancer   . History of secondary kidney cancer   . Hyperlipidemia   . Liver cancer ([place])   . Mild cognitive impairment   . Neuromuscular disorder (HCC)    neuropathy   . Osteoarthritis   . Oxygen deficiency    2 liters at bedtime   . Pneumonia   . Pulmonary nodule    multiple  . Renal cell carcinoma     HOSPITAL COURSE:   73 y.o. female with past medical history of type 2 diabetes, COPD O2 dependent, CKD, hyperlipidemia, GERD and recent diagnosis of metastatic renal cell carcinoma presented with complaints of shortness of breath.  #1.Acute on chronic hypoxic  respiratory failure secondarytocommunity acquiredpneumonia:Patient presented with progressive shortness of breath requiring supplemental oxygen.On oxygen at home -Supplemental O2 for dyspnea and/or hypoxia -Scheduled and prn bronchodilator therapy Nebulized steroids  2.Community-acquired pneumonia- CT angio chest shows new solid and ground-glass nodular airspace disease throughout both lungs, right much greater than left. Lactic acid levels back to normal, WBC  Improving from 29.1 down to 17 at discharge -Patient was on IV abx Cefepime and Vanc. Will discharge on oral Azithromycin x 3 days, patient cleared by speech for oral intake. -MRSA by PCR negative -Blood cultures so far no growth -Ruledout for COVID-19 infection  3. COPD (chronic obstructive pulmonary disease) (HCC) -home dose inhalers, additional PRN supportive inhalers as above  4. Acute on chronic renal failure (HCC) -Acute phase multifactorial causes in the setting of hypercalcemia, infection, dehydration and chemo drug -Kidney functions improved with IVFs  5.Type 2 diabetes, controlled, with neuropathy ([place])  -Resume home diabetes meds  6. Metastatic renal cell carcinoma (HCC) -patient recently started sunitinib per chart review.  -Per oncology Sutent stopped as family would wish to proceed with home hospice. -Home hospice following at home.  7. HLD (hyperlipidemia) - Continue home dose antilipid  8. GERD -home dose PPI  9. Hypocalcemia- Improved  10. Hypokalemia - Resolved at discharge  11. Hypomagnesia -  Resolved at discharge   DISCHARGE CONDITIONS:   [place] CONSULTS OBTAINED:    DRUG ALLERGIES:   Allergies  Allergen Reactions  . Budesonide-Formoterol Fumarate Other (See Comments)    (Symbicort) coughed worse  . Lipitor [Atorvastatin] Other (See Comments)    Nosebleeds  . Penicillins Rash    Did it involve swelling of the face/tongue/throat, SOB, or low BP? No Did it involve sudden  or severe rash/hives, skin peeling, or any reaction on the inside of your mouth or nose? No Did you need to seek medical attention at a hospital or doctor's office? No When did it last happen?childhood allergy If all above answers are "NO", may proceed with cephalosporin use.    DISCHARGE MEDICATIONS:   Allergies as of 06/28/2018      Reactions   Budesonide-formoterol Fumarate Other (See Comments)   (Symbicort) coughed worse   Lipitor [atorvastatin] Other (See Comments)   Nosebleeds   Penicillins Rash   Did it involve swelling of the face/tongue/throat, SOB, or low BP? No Did it involve sudden or severe rash/hives, skin peeling, or any reaction on the inside of your mouth or nose? No Did you need to seek medical attention at a hospital or doctor's office? No When did it last happen?childhood allergy If all above answers are "NO", may proceed with cephalosporin use.      Medication List    STOP taking these medications   SUNItinib 12.5 MG capsule Commonly known as:  SUTENT     TAKE these medications   albuterol 108 (90 Base) MCG/ACT inhaler Commonly known as:  VENTOLIN HFA Inhale 2 puffs into the lungs every 6 (six) hours as needed for wheezing or shortness of breath.   albuterol (2.5 MG/3ML) 0.083% nebulizer solution Commonly known as:  PROVENTIL Take 3 mLs (2.5 mg total) by nebulization every 6 (six) hours as needed for wheezing or shortness of breath.   azithromycin 500 MG tablet Commonly known as:  Zithromax Take 1 tablet (500 mg total) by mouth daily.   bismuth subsalicylate 654 YT/03TW suspension Commonly known as:  PEPTO BISMOL Take 30 mLs by mouth every 6 (six) hours as needed for indigestion.   cholecalciferol 25 MCG (1000 UT) tablet Commonly known as:  VITAMIN D3 Take 1,000 Units by mouth daily.   diphenoxylate-atropine 2.5-0.025 MG tablet Commonly known as:  Lomotil Take 1 tablet by mouth 4 (four) times daily as needed for diarrhea or loose  stools.   fexofenadine 180 MG tablet Commonly known as:  ALLEGRA Take 180 mg by mouth daily as needed (nasal drainage).   Fish Oil 1200 MG Caps Take 1,200 mg by mouth daily.   fluticasone 50 MCG/ACT nasal spray Commonly known as:  FLONASE USE 2 SPRAYS NASALLY DAILY What changed:    how much to take  how to take this  when to take this  additional instructions   furosemide 20 MG tablet Commonly known as:  LASIX Take 1 tablet (20 mg total) by mouth daily as needed.   metFORMIN 500 MG tablet Commonly known as:  GLUCOPHAGE TAKE 1 TABLET TWICE A DAY  WITH MEALS What changed:  when to take this   mometasone-formoterol 200-5 MCG/ACT Aero Commonly known as:  DULERA Take 2 puffs first thing in am and then another 2 puffs about 12 hours later. What changed:    how much to take  how to take this  when to take this   ondansetron 4 MG tablet Commonly known as:  ZOFRAN Take 1  tablet (4 mg total) by mouth every 6 (six) hours as needed for nausea or vomiting.   pravastatin 40 MG tablet Commonly known as:  PRAVACHOL TAKE 1 TABLET DAILY   pregabalin 75 MG capsule Commonly known as:  Lyrica Take 1 capsule (75 mg total) by mouth 3 (three) times daily. What changed:  when to take this   QUEtiapine 25 MG tablet Commonly known as:  SEROQUEL Take 0.5 tablets (12.5 mg total) by mouth at bedtime as needed (agitation).   tiotropium 18 MCG inhalation capsule Commonly known as:  Spiriva HandiHaler INHALE THE CONTENTS OF 1   CAPSULE VIA HANDIHALER     DAILY What changed:    how much to take  how to take this  when to take this        DISCHARGE INSTRUCTIONS:    DIET:   Low fat, Low cholesterol diet and Renal diet  ACTIVITY:   Activity as tolerated  OXYGEN:   Home Oxygen: Yes.    Oxygen Delivery: 33% O2 via Patient connected to nasal cannula oxygen  DISCHARGE LOCATION:   home   If you experience worsening of your admission symptoms, develop shortness of  breath, life threatening emergency, suicidal or homicidal thoughts you must seek medical attention immediately by calling 911 or calling your MD immediately  if symptoms less severe.  You Must read complete instructions/literature along with all the possible adverse reactions/side effects for all the Medicines you take and that have been prescribed to you. Take any new Medicines after you have completely understood and accpet all the possible adverse reactions/side effects.   Please note  You were cared for by a hospitalist during your hospital stay. If you have any questions about your discharge medications or the care you received while you were in the hospital after you are discharged, you can call the unit and asked to speak with the hospitalist on call if the hospitalist that took care of you is not available. Once you are discharged, your primary care physician will handle any further medical issues. Please note that NO REFILLS for any discharge medications will be authorized once you are discharged, as it is imperative that you return to your primary care physician (or establish a relationship with a primary care physician if you do not have one) for your aftercare needs so that they can reassess your need for medications and monitor your lab values.    On the day of Discharge:  VITAL SIGNS:   Blood pressure (!) 154/75, pulse 87, temperature 98.9 F (37.2 C), temperature source Oral, resp. rate 18, height 5\' 3"  (1.6 m), weight 57.2 kg, SpO2 100 %.  PHYSICAL EXAMINATION:    GENERAL:  73 y.o.-year-old patient lying in the bed with no acute distress.  EYES: Pupils equal, round, reactive to light and accommodation. No scleral icterus. Extraocular muscles intact.  HEENT: Head atraumatic, normocephalic. Oropharynx and nasopharynx clear.  NECK:  Supple, no jugular venous distention. No thyroid enlargement, no tenderness.  LUNGS: Normal breath sounds bilaterally, no wheezing, rales,rhonchi or  crepitation. No use of accessory muscles of respiration.  CARDIOVASCULAR: S1, S2 normal. No murmurs, rubs, or gallops.  ABDOMEN: Soft, non-tender, non-distended. Bowel sounds present. No organomegaly or mass.  EXTREMITIES: No pedal edema, cyanosis, or clubbing.  NEUROLOGIC: Cranial nerves II through XII are intact. Muscle strength 5/5 in all extremities. Sensation intact. Gait not checked.  PSYCHIATRIC: The patient is alert and oriented x 3.  SKIN: No obvious rash, lesion, or ulcer.  DATA REVIEW:   CBC Recent Labs  Lab 06/28/18 0400  WBC 17.3*  HGB 8.6*  HCT 28.9*  PLT 327    Chemistries  Recent Labs  Lab 06/27/18 0313 06/28/18 0400  NA 142 145  K 3.1* 3.9  CL 109 109  CO2 25 27  GLUCOSE 139* 143*  BUN 20 21  CREATININE 1.29* 1.27*  CALCIUM 7.0* 8.0*  MG 1.1* 2.3  AST 14*  --   ALT 11  --   ALKPHOS 66  --   BILITOT 0.4  --      Microbiology Results  Results for orders placed or performed during the hospital encounter of 06/25/18  Blood culture (routine x 2)     Status: None (Preliminary result)   Collection Time: 06/25/18  5:25 PM  Result Value Ref Range Status   Specimen Description BLOOD RFA  Final   Special Requests   Final    BOTTLES DRAWN AEROBIC AND ANAEROBIC Blood Culture results may not be optimal due to an excessive volume of blood received in culture bottles   Culture   Final    NO GROWTH 3 DAYS Performed at [facility], [address] [place], [place] [address]    Report Status PENDING  Incomplete  Blood culture (routine x 2)     Status: None (Preliminary result)   Collection Time: 06/25/18  5:25 PM  Result Value Ref Range Status   Specimen Description BLOOD RFA  Final   Special Requests   Final    BOTTLES DRAWN AEROBIC AND ANAEROBIC Blood Culture results may not be optimal due to an inadequate volume of blood received in culture bottles   Culture   Final    NO GROWTH 3 DAYS Performed at [facility], [address], [place], [place] [address]    Report Status PENDING  Incomplete  SARS Coronavirus 2 [facility] order, Performed in [place] hospital lab)     Status: None   Collection Time: 06/25/18  5:25 PM  Result Value Ref Range Status   SARS Coronavirus 2 NEGATIVE NEGATIVE Final    Comment: (NOTE) If result is NEGATIVE SARS-CoV-2 target nucleic acids are NOT DETECTED. The SARS-CoV-2 RNA is generally detectable in upper and lower  respiratory specimens during the acute phase of infection. The lowest  concentration of SARS-CoV-2 viral copies this assay can detect is 250  copies / mL. A negative result does not preclude SARS-CoV-2 infection  and should not be used as the sole basis for treatment or other  patient management decisions.  A negative result may occur with  improper specimen collection / handling, submission of specimen other  than nasopharyngeal swab, presence of viral mutation(s) within the  areas targeted by this assay, and inadequate number of viral copies  (<250 copies / mL). A negative result must be combined with clinical  observations, patient history, and epidemiological information. If result is POSITIVE SARS-CoV-2 target nucleic acids are DETECTED. The SARS-CoV-2 RNA is generally detectable in upper and lower  respiratory specimens dur ing the acute phase of infection.  Positive  results are indicative of active infection with SARS-CoV-2.  Clinical  correlation with patient history and other diagnostic information is  necessary to determine patient infection status.  Positive results do  not rule out bacterial infection or co-infection with other viruses. If result is PRESUMPTIVE POSTIVE SARS-CoV-2 nucleic acids MAY BE PRESENT.   A presumptive positive result was obtained on the submitted specimen  and confirmed on  repeat testing.  While 2019 novel coronavirus  (SARS-CoV-2) nucleic acids may be present in the submitted sample  additional confirmatory testing may be  necessary for epidemiological  and / or clinical management purposes  to differentiate between  SARS-CoV-2 and other Sarbecovirus currently known to infect humans.  If clinically indicated additional testing with an alternate test  methodology [phone]) is advised. The SARS-CoV-2 RNA is generally  detectable in upper and lower respiratory sp ecimens during the acute  phase of infection. The expected result is Negative. Fact Sheet for Patients:  StrictlyIdeas.no Fact Sheet for Healthcare Providers: BankingDealers.co.za This test is not yet approved or cleared by the [place] FDA and has been authorized for detection and/or diagnosis of SARS-CoV-2 by FDA under an Emergency Use Authorization (EUA).  This EUA will remain in effect (meaning this test can be used) for the duration of the COVID-19 declaration under Section 564(b)(1) of the Act, 21 U.S.C. section 360bbb-3(b)(1), unless the authorization is terminated or revoked sooner. Performed at [facility], [address] [place], [place] [address]   MRSA PCR Screening     Status: None   Collection Time: 06/27/18  7:39 AM  Result Value Ref Range Status   MRSA by PCR NEGATIVE NEGATIVE Final    Comment:        The GeneXpert MRSA Assay (FDA approved for NASAL specimens only), is one component of a comprehensive MRSA colonization surveillance program. It is not intended to diagnose MRSA infection nor to guide or monitor treatment for MRSA infections. Performed at [facility], [address], [place], [place] [address]     RADIOLOGY:  No results found.   Management plans discussed with the patient, family and they are in agreement.  CODE STATUS:     Code Status Orders  (From admission, onward)         Start     Ordered   06/26/18 0044  Do not attempt resuscitation (DNR)  Continuous    Question Answer Comment  In the event of cardiac or  respiratory ARREST Do not call a "code blue"   In the event of cardiac or respiratory ARREST Do not perform Intubation, CPR, defibrillation or ACLS   In the event of cardiac or respiratory ARREST Use medication by any route, position, wound care, and other measures to relive pain and suffering. May use oxygen, suction and manual treatment of airway obstruction as needed for comfort.      06/26/18 0043        Code Status History    Date Active Date Inactive Code Status Order ID Comments User Context   06/25/2018 2124 06/26/2018 0043 Full Code [id number]  Lance Coon, MD ED   04/06/2018 1536 04/09/2018 1523 Full Code [id number]  Mcarthur Rossetti, MD Inpatient    Advance Directive Documentation     Most Recent Value  Type of Advance Directive  Healthcare Power of Attorney  Pre-existing out of facility DNR order (yellow form or pink MOST form)  -  "MOST" Form in Place?  -      TOTAL TIME TAKING CARE OF THIS PATIENT: 35 minutes.     06/28/2018 at 1:55 PM   This patient was staffed with Dr. Stark Jock, [place] who personally evaluated patient, reviewed documentation and agreed with discharge plan of care as above.  Rufina Falco, DNP, FNP-BC Hospitalist Nurse Practitioner  Between 7am to 6pm - Pager [phone]  After 6pm go to [email or web address] - [place]  [facility]  Office  [phone]  CC: Primary care physician; Venia Carbon, MD   Note: This dictation was prepared with Dragon dictation along with smaller phrase technology. Any transcriptional errors that result from this process are unintentional.

## 2018-06-28 NOTE — Progress Notes (Signed)
Patient discharged home with hospice. Prescriptions given to patient and reviewed with husband via phone. Discharge instructions given  to husband via phone and all questions answered.

## 2018-06-28 NOTE — TOC Transition Note (Signed)
Transition of Care [facility]) - CM/SW Discharge Note   Patient Details  Name: [name] MRN: [id number] Date of Birth: September 15, 1945  [facility] CM/SW Contact:  Annamaria Boots, [place] Phone Number: 06/28/2018, 2:08 PM   Clinical Narrative: Patient is medically ready for discharge today. Patient will be discharged home with hospice services. CSW notified patient's husband of discharge today. Patient's husband will transport patient home. CSW also notified Santiago Glad with [facility] of discharge today.       Final next level of care: Home w Hospice Care Barriers to Discharge: No Barriers Identified   Patient Goals and CMS Choice Patient states their goals for this hospitalization and ongoing recovery are:: comfort care  CMS Medicare.gov Compare Post Acute Care list provided to:: Patient Represenative (must comment)(Patient's husband ) Choice offered to / list presented to : Spouse  Discharge Placement                    Patient and family notified of of transfer: 06/28/18  Discharge Plan and Services     Post Acute Care Choice: Hospice                [facility] Agency: [facility]   Social Determinants of Health (SDOH) Interventions     Readmission Risk Interventions No flowsheet data found.

## 2018-06-28 NOTE — Progress Notes (Signed)
Follow up visit made to new hospice referral. Patient heard on the phone with her husband, some what upset. Writer spoke with staff RN Dedra, plan was for PRN Seroquel as patient had been agitated this morning. She also shared that patient had a near choking incident this morning during her breakfast. Attending MD/NP aware. Speech eval has been ordered. Plan is still for discharge home today. Patient will require EMS transport. Signed DNR in place in d/c packet along with hospice information and contact number. . Discharge summary to be faxed to referral. Thank you. Flo Shanks BSN, RN, [facility] [phone]

## 2018-06-28 NOTE — Progress Notes (Signed)
Pt attempting to get out of bed, stating she is at home and wants out of her bed. Multiple attempts to redirect and keep pt in bed but pt became more confused. Oriented pt to where she was but pt refused to believe this staff and NT on floor. Seroquel prn given to pt as ordered and bed alarm on. Frequent checks on pt for safety and comfort. Pdowless, rn 06/28/2018

## 2018-06-28 NOTE — Telephone Encounter (Signed)
Christy aware of dr Everardo Beals' comment  Rollene Fare spoke with [facility]

## 2018-06-28 NOTE — Telephone Encounter (Signed)
Yes I will

## 2018-06-28 NOTE — Evaluation (Signed)
Clinical/Bedside Swallow Evaluation Patient Details  Name: [name] MRN: [id number] Date of Birth: [birth year]  Today's Date: 06/28/2018 Time: SLP Start Time (ACUTE ONLY): 20 SLP Stop Time (ACUTE ONLY): 1225 SLP Time Calculation (min) (ACUTE ONLY): 55 min  Past Medical History:  Past Medical History:  Diagnosis Date  . Allergy   . Anemia   . Anxiety   . Asthma   . Blood transfusion without reported diagnosis   . Breast cancer [facility])    h/o- radiation  . Carcinoid tumor of lung 1991  . Cataract    removed x 1   . COPD (chronic obstructive pulmonary disease) ([place])   . Depression   . Diabetes mellitus type II   . Diverticulosis   . Dyspnea    with exertion   . Gastroparesis   . GERD (gastroesophageal reflux disease)   . History of adenomatous polyp of colon   . History of breast cancer   . History of secondary kidney cancer   . Hyperlipidemia   . Liver cancer ([place])   . Mild cognitive impairment   . Neuromuscular disorder (HCC)    neuropathy   . Osteoarthritis   . Oxygen deficiency    2 liters at bedtime   . Pneumonia   . Pulmonary nodule    multiple  . Renal cell carcinoma    Past Surgical History:  Past Surgical History:  Procedure Laterality Date  . BIOPSY  05/16/2018   Procedure: BIOPSY;  Surgeon: Rush Landmark Telford Nab., MD;  Location: [facility];  Service: Gastroenterology;;  . BREAST LUMPECTOMY  1998   right breast  . CHOLECYSTECTOMY  1975  . COLONOSCOPY    . COLONOSCOPY WITH PROPOFOL N/A 05/16/2018   Procedure: COLONOSCOPY WITH PROPOFOL;  Surgeon: Rush Landmark Telford Nab., MD;  Location: [facility];  Service: Gastroenterology;  Laterality: N/A;  . HEMOSTASIS CLIP PLACEMENT  05/16/2018   Procedure: HEMOSTASIS CLIP PLACEMENT;  Surgeon: Irving Copas., MD;  Location: [facility];  Service: Gastroenterology;;  . HIP FRACTURE SURGERY Left 03/2018  . HIP PINNING,CANNULATED Left 04/06/2018   Procedure: CANNULATED SCREWS/HIP PINNING LEFT HIP;   Surgeon: Mcarthur Rossetti, MD;  Location: [facility];  Service: Orthopedics;  Laterality: Left;  . LAPAROSCOPIC NEPHRECTOMY  01/2009   right  . LUNG SURGERY  1994   carcinoid removal  . POLYPECTOMY    . POLYPECTOMY  05/16/2018   Procedure: POLYPECTOMY;  Surgeon: Mansouraty, Telford Nab., MD;  Location: [facility];  Service: Gastroenterology;;  . TONSILLECTOMY AND ADENOIDECTOMY  1953   HPI:  Pt is a 73 y.o. female with a history of Mild Cognitive immpairment, carcinoid tumor of the lung, COPD, diabetes, GERD, hyperlipidemia, neuromuscular disorder, pneumonia, renal cell carcinoma who presents to the ED for shortness of breath.  Patient has had some chest tightness and shortness of breath.  She has not had a cough, denies fevers, chills, vomiting or diarrhea.  She started on a new chemotherapy drug recently for Ca of liver and kidney with side effects of shortness of breath.  She is on chronic 4 L of home oxygen use.  NSG reported pt became choked at her breakfast meal when talking w/ NP in room. NSG has not noticed any further coughing/choking stating pt has swallowed Pills w/ water adequately.    Assessment / Plan / Recommendation Clinical Impression  Pt appears to present w/ fairly adequate oropharyngeal phase swallowing function w/ no immediate, overt s/s of aspiration noted w/ po trials presented at evaluation today. However, pt's  swallowing ability is impacted by declined Cognitive and Respiratory status' as well as deconditioning d/t illness. Pt has swallowed all Pills w/ water w/out difficulty per NSG report. Pt does appear to exhibit min Oral phase slowness w/ need for min increased mastication time/effort w/ solid foods - she does wear Full Dentures which were placed by SLP for this evaluation.  At this evaluation, pt was assisted in positioning more upright for po's. She appeared weak and min shaky overall; min decreased concentration w/ tasks. Pt does have a baseline of Cognitive  Impairment per chart notes. With setup and support, she fed herself po trials of thin liquids, purees, and soft solids w/ no immediate, overt coughing or throat clearing noted; no decline in respiratory status occurred during/post trials and vocal quality remained clear as at baseline. She did exhibit a delayed cough post trial of solid food and thin liquid each - unsure if she fully attended to the trial to complete swallowing/clearing d/t her drowsy appeareance at times during assessment. Oral phase bolus management w/ trial consistencies was grossly [facility] w/ timely bolus management and oral clearing post swallows of liquids and purees; min increased time for full mastication of soft solids. Suspect min impact from Full Dentures placed - must ensure stability w/ use. OM exam appeared grossly [facility] w/ no overt unilateral weakness noted.  Recommend a regular/mech soft diet consistency of foods, less tough meats; thin liquids. Recommend general aspiration precautions and Supervision w/ meals to ensure safety w/ po's - monitor for any increased s/s of aspiration or reduced toleration of diet as well as the need for full alertness to take po's and possible need for adhesive to secure Dentures. F/u w/ primary MD if any decline in status is noted post d/c home for potential referral for objective assessment of swallowing. Noted pt's f/u w/ Palliative Care/Hospice services at d/c. Recommend Pills in Puree if any difficulty noted swallowing w/ liquids.  SLP Visit Diagnosis: Dysphagia, oropharyngeal phase (R13.12)    Aspiration Risk  Mild aspiration risk - reduced following precautions   Diet Recommendation  Mech Soft foods, regular diet; thin liquids. Aspiration precautions; supervision during meals  Medication Administration: Whole meds with puree(if needed for safer swallowing)    Other  Recommendations Recommended Consults: (Dietician f/u; palliative care/hospice services following) Oral Care Recommendations:  Oral care BID;Staff/trained caregiver to provide oral care Other Recommendations: (n/a)   Follow up Recommendations None      Frequency and Duration (n/a)  (n/a)       Prognosis Prognosis for Safe Diet Advancement: Fair Barriers to Reach Goals: Cognitive deficits;Time post onset;Severity of deficits      Swallow Study   General Date of Onset: 06/25/18 HPI: Pt is a 73 y.o. female with a history of Mild Cognitive immpairment, carcinoid tumor of the lung, COPD, diabetes, GERD, hyperlipidemia, neuromuscular disorder, pneumonia, renal cell carcinoma who presents to the ED for shortness of breath.  Patient has had some chest tightness and shortness of breath.  She has not had a cough, denies fevers, chills, vomiting or diarrhea.  She started on a new chemotherapy drug recently for Ca of liver and kidney with side effects of shortness of breath.  She is on chronic 4 L of home oxygen use.  NSG reported pt became choked at her breakfast meal when talking w/ NP in room. NSG has not noticed any further coughing/choking stating pt has swallowed Pills w/ water adequately.  Type of Study: Bedside Swallow Evaluation Previous Swallow Assessment: none  reported Diet Prior to this Study: Regular;Thin liquids Temperature Spikes Noted: No(wbc 17.3 declined from 29) Respiratory Status: Nasal cannula(3 liters) History of Recent Intubation: No Behavior/Cognition: Alert;Cooperative;Pleasant mood;Distractible;Requires cueing(unsure of her baseline Cognitive status?) Oral Cavity Assessment: Within Functional Limits Oral Care Completed by SLP: Yes Oral Cavity - Dentition: Dentures, top;Dentures, bottom(placed by SLP - unsure if wearing at breakfast meal?) Vision: Functional for self-feeding Self-Feeding Abilities: Able to feed self;Needs assist;Needs set up(appeared shaky, weak in U/LEs) Patient Positioning: Upright in bed(needed help w/ positioning upright) Baseline Vocal Quality: Normal;Low vocal  intensity(shaky appearing quality) Volitional Cough: Weak(-Fair) Volitional Swallow: Able to elicit    Oral/Motor/Sensory Function Overall Oral Motor/Sensory Function: Within functional limits   Ice Chips Ice chips: Within functional limits Presentation: Spoon(fed; 2 trials)   Thin Liquid Thin Liquid: Impaired Presentation: Cup;Self Fed;Straw(~4-5 ozs of Pepsi total) Oral Phase Impairments: (fair) Oral Phase Functional Implications: (fair) Pharyngeal  Phase Impairments: Cough - Delayed(x1)    Nectar Thick Nectar Thick Liquid: Not tested   Honey Thick Honey Thick Liquid: Not tested   Puree Puree: Within functional limits Presentation: Spoon;Self Fed(3 trials) Other Comments: did not like applesauce   Solid     Solid: Impaired Presentation: Spoon(fed; 4 trials ("too many")) Oral Phase Impairments: Impaired mastication(increased time) Oral Phase Functional Implications: Impaired mastication;Prolonged oral transit(increased time) Pharyngeal Phase Impairments: Cough - Delayed(x1) Other Comments: stated she felt it "catch" in her throat       Orinda Kenner, MS, CCC-SLP Watson,Katherine 06/28/2018,5:46 PM

## 2018-06-28 NOTE — Telephone Encounter (Signed)
Christie with [facility] left v/m pt is presently at [facility] in pt and may be d/c today. Appears pt is still in pt at this time. Adonis Brook said [facility] wants pt to go home with hospice and Adonis Brook wants to know if Dr Silvio Pate is in agreement with hospice services and if so will Dr Silvio Pate be the attending for hospice.Please advise.

## 2018-06-29 ENCOUNTER — Other Ambulatory Visit: Payer: Self-pay | Admitting: Internal Medicine

## 2018-06-29 ENCOUNTER — Telehealth: Payer: Self-pay

## 2018-06-29 DIAGNOSIS — L89151 Pressure ulcer of sacral region, stage 1: Secondary | ICD-10-CM | POA: Diagnosis not present

## 2018-06-29 DIAGNOSIS — J9621 Acute and chronic respiratory failure with hypoxia: Secondary | ICD-10-CM | POA: Diagnosis not present

## 2018-06-29 DIAGNOSIS — C641 Malignant neoplasm of right kidney, except renal pelvis: Secondary | ICD-10-CM | POA: Diagnosis not present

## 2018-06-29 DIAGNOSIS — M1990 Unspecified osteoarthritis, unspecified site: Secondary | ICD-10-CM | POA: Diagnosis not present

## 2018-06-29 DIAGNOSIS — C787 Secondary malignant neoplasm of liver and intrahepatic bile duct: Secondary | ICD-10-CM | POA: Diagnosis not present

## 2018-06-29 DIAGNOSIS — E1122 Type 2 diabetes mellitus with diabetic chronic kidney disease: Secondary | ICD-10-CM | POA: Diagnosis not present

## 2018-06-29 DIAGNOSIS — Z85118 Personal history of other malignant neoplasm of bronchus and lung: Secondary | ICD-10-CM | POA: Diagnosis not present

## 2018-06-29 DIAGNOSIS — S72002D Fracture of unspecified part of neck of left femur, subsequent encounter for closed fracture with routine healing: Secondary | ICD-10-CM | POA: Diagnosis not present

## 2018-06-29 DIAGNOSIS — I2510 Atherosclerotic heart disease of native coronary artery without angina pectoris: Secondary | ICD-10-CM | POA: Diagnosis not present

## 2018-06-29 DIAGNOSIS — Z853 Personal history of malignant neoplasm of breast: Secondary | ICD-10-CM | POA: Diagnosis not present

## 2018-06-29 DIAGNOSIS — N183 Chronic kidney disease, stage 3 (moderate): Secondary | ICD-10-CM | POA: Diagnosis not present

## 2018-06-29 DIAGNOSIS — K219 Gastro-esophageal reflux disease without esophagitis: Secondary | ICD-10-CM | POA: Diagnosis not present

## 2018-06-29 DIAGNOSIS — J189 Pneumonia, unspecified organism: Secondary | ICD-10-CM | POA: Diagnosis not present

## 2018-06-29 DIAGNOSIS — J302 Other seasonal allergic rhinitis: Secondary | ICD-10-CM | POA: Diagnosis not present

## 2018-06-29 DIAGNOSIS — I129 Hypertensive chronic kidney disease with stage 1 through stage 4 chronic kidney disease, or unspecified chronic kidney disease: Secondary | ICD-10-CM | POA: Diagnosis not present

## 2018-06-29 DIAGNOSIS — Z87891 Personal history of nicotine dependence: Secondary | ICD-10-CM | POA: Diagnosis not present

## 2018-06-29 DIAGNOSIS — Z9981 Dependence on supplemental oxygen: Secondary | ICD-10-CM | POA: Diagnosis not present

## 2018-06-29 DIAGNOSIS — E785 Hyperlipidemia, unspecified: Secondary | ICD-10-CM | POA: Diagnosis not present

## 2018-06-29 DIAGNOSIS — E1142 Type 2 diabetes mellitus with diabetic polyneuropathy: Secondary | ICD-10-CM | POA: Diagnosis not present

## 2018-06-29 DIAGNOSIS — D630 Anemia in neoplastic disease: Secondary | ICD-10-CM | POA: Diagnosis not present

## 2018-06-29 DIAGNOSIS — F419 Anxiety disorder, unspecified: Secondary | ICD-10-CM | POA: Diagnosis not present

## 2018-06-29 DIAGNOSIS — J449 Chronic obstructive pulmonary disease, unspecified: Secondary | ICD-10-CM | POA: Diagnosis not present

## 2018-06-29 MED ORDER — NYSTATIN 100000 UNIT/ML MT SUSP: 5.0000 mL | Freq: Four times a day (QID) | OROMUCOSAL | 0 refills | Status: AC

## 2018-06-29 MED ORDER — NYSTATIN 100000 UNIT/ML MT SUSP: 5.0000 mL | Freq: Four times a day (QID) | OROMUCOSAL | 0 refills | Status: DC

## 2018-06-29 NOTE — Telephone Encounter (Signed)
Inhaler is not on current med list, please advise

## 2018-06-29 NOTE — Telephone Encounter (Signed)
I sent this Rx and sent the nystatin there as well. Please cancel the order to [facility] if they get it from [facility]

## 2018-06-29 NOTE — Telephone Encounter (Signed)
Lisa nurse wth [facility] left vm that pt was admitted to hospice today and having problems with thrush and request niastatin suspension order.Please advise.

## 2018-06-29 NOTE — Telephone Encounter (Signed)
Unable to speak with [name] nurse with hospice.  Left v/m as instructed for [name]. I spoke with pts husband and notified as instructed and he voiced understanding. [name] said the pharmacy hd just notified him the rx was ready for pickup. FYI to Dr Silvio Pate.

## 2018-06-29 NOTE — Telephone Encounter (Signed)
Please let the hospice nurse and the patient's husband know that the prescription was sent to their [facility]. The computer said it wasn't covered by insurance, but hospice may pay for it (or I can send something different tomorrow if it is too much)

## 2018-06-29 NOTE — Telephone Encounter (Signed)
She has gone home now I do agree with hospice services and will be the attending of record

## 2018-06-30 DIAGNOSIS — J449 Chronic obstructive pulmonary disease, unspecified: Secondary | ICD-10-CM | POA: Diagnosis not present

## 2018-06-30 DIAGNOSIS — J189 Pneumonia, unspecified organism: Secondary | ICD-10-CM | POA: Diagnosis not present

## 2018-06-30 DIAGNOSIS — C641 Malignant neoplasm of right kidney, except renal pelvis: Secondary | ICD-10-CM | POA: Diagnosis not present

## 2018-06-30 DIAGNOSIS — I129 Hypertensive chronic kidney disease with stage 1 through stage 4 chronic kidney disease, or unspecified chronic kidney disease: Secondary | ICD-10-CM | POA: Diagnosis not present

## 2018-06-30 DIAGNOSIS — J9621 Acute and chronic respiratory failure with hypoxia: Secondary | ICD-10-CM | POA: Diagnosis not present

## 2018-06-30 DIAGNOSIS — C787 Secondary malignant neoplasm of liver and intrahepatic bile duct: Secondary | ICD-10-CM | POA: Diagnosis not present

## 2018-06-30 LAB — CULTURE, BLOOD (ROUTINE X 2)
Culture: NO GROWTH
Culture: NO GROWTH

## 2018-06-30 NOTE — Telephone Encounter (Signed)
LM for patient to call back and inform us where she picked up her nystatin prescription from.   [facility] says they have the nystatin on "profile" for her and she has not picked up yet.  Will clarify with patient when she calls back and cancel the r/x at the appropriate location.  Thanks.

## 2018-06-30 NOTE — Telephone Encounter (Signed)
Okay---good

## 2018-07-02 DIAGNOSIS — I129 Hypertensive chronic kidney disease with stage 1 through stage 4 chronic kidney disease, or unspecified chronic kidney disease: Secondary | ICD-10-CM | POA: Diagnosis not present

## 2018-07-02 DIAGNOSIS — J449 Chronic obstructive pulmonary disease, unspecified: Secondary | ICD-10-CM | POA: Diagnosis not present

## 2018-07-02 DIAGNOSIS — C641 Malignant neoplasm of right kidney, except renal pelvis: Secondary | ICD-10-CM | POA: Diagnosis not present

## 2018-07-02 DIAGNOSIS — J189 Pneumonia, unspecified organism: Secondary | ICD-10-CM | POA: Diagnosis not present

## 2018-07-02 DIAGNOSIS — J9621 Acute and chronic respiratory failure with hypoxia: Secondary | ICD-10-CM | POA: Diagnosis not present

## 2018-07-02 DIAGNOSIS — C787 Secondary malignant neoplasm of liver and intrahepatic bile duct: Secondary | ICD-10-CM | POA: Diagnosis not present

## 2018-07-03 ENCOUNTER — Other Ambulatory Visit: Payer: Self-pay | Admitting: *Deleted

## 2018-07-03 MED ORDER — ONDANSETRON HCL 4 MG PO TABS: 4.0000 mg | ORAL_TABLET | Freq: Four times a day (QID) | ORAL | 0 refills | Status: AC | PRN

## 2018-07-05 ENCOUNTER — Ambulatory Visit: Payer: Medicare Other | Admitting: Internal Medicine

## 2018-07-05 ENCOUNTER — Other Ambulatory Visit: Payer: Self-pay

## 2018-07-05 ENCOUNTER — Encounter: Payer: Self-pay | Admitting: Internal Medicine

## 2018-07-05 VITALS — BP 116/60 | HR 78 | Resp 20

## 2018-07-05 DIAGNOSIS — B370 Candidal stomatitis: Secondary | ICD-10-CM | POA: Diagnosis not present

## 2018-07-05 DIAGNOSIS — J9601 Acute respiratory failure with hypoxia: Secondary | ICD-10-CM | POA: Insufficient documentation

## 2018-07-05 DIAGNOSIS — F39 Unspecified mood [affective] disorder: Secondary | ICD-10-CM

## 2018-07-05 DIAGNOSIS — C649 Malignant neoplasm of unspecified kidney, except renal pelvis: Secondary | ICD-10-CM | POA: Diagnosis not present

## 2018-07-05 DIAGNOSIS — J449 Chronic obstructive pulmonary disease, unspecified: Secondary | ICD-10-CM

## 2018-07-05 DIAGNOSIS — J9611 Chronic respiratory failure with hypoxia: Secondary | ICD-10-CM | POA: Diagnosis not present

## 2018-07-05 MED ORDER — FLUCONAZOLE 100 MG PO TABS: 100.0000 mg | ORAL_TABLET | Freq: Every day | ORAL | 1 refills | Status: AC

## 2018-07-05 NOTE — Assessment & Plan Note (Signed)
Now using it all the time Husband monitors oximetry---okay to cut back during the day if her sats are okay

## 2018-07-05 NOTE — Assessment & Plan Note (Signed)
Attributed to pneumonia but this is not clear cut The timing is suggestive of reaction to the sunitinib Seems back to baseline after stopping this and the antibiotics

## 2018-07-05 NOTE — Assessment & Plan Note (Signed)
Looks better but still having symptoms that are affecting her eating Will add several days of fluconazole

## 2018-07-05 NOTE — Progress Notes (Signed)
Subjective:    Patient ID: [name], female    DOB: Dec 22, 1945, 73 y.o.   MRN: [id number]  HPI Home visit for follow up Recent hospitalization for respiratory symptoms---unsure if medication reaction vs infection Husband is here  She started the cancer med 4 days later she developed SOB and hypoxia No fever Did have cough Husband tried nebulizer--not much help Was hospitalized and treated for pneumonia Did come home on azithromycin Developed thrust---still not better with nystatin suspension Was opened for care by hospice Vivien Rota is primary RN)  She feels her breathing is back to her baseline No regular cough No fever  No chest pain No palpitations Some dizziness--both sitting and when getting up. No syncope Feels like she has sinus congestion and this is causing the dizziness  Not checking sugars Last A1c acceptable  CKD is stable GFR 42  Able to shower herself Is able to take the oxygen off then---otherwise wears it all the time No incontinence Still feels weak  Feels down sometimes--hard with all that is going on in world and with her (as well as recent hospital stay) No daily symptoms Not anhedonic  Current Outpatient Medications on File Prior to Visit  Medication Sig Dispense Refill  . albuterol (PROVENTIL) (2.5 MG/3ML) 0.083% nebulizer solution Take 3 mLs (2.5 mg total) by nebulization every 6 (six) hours as needed for wheezing or shortness of breath. 150 mL 1  . bismuth subsalicylate (PEPTO BISMOL) 262 MG/15ML suspension Take 30 mLs by mouth every 6 (six) hours as needed for indigestion.    . cholecalciferol (VITAMIN D3) 25 MCG (1000 UT) tablet Take 1,000 Units by mouth daily.    . diphenoxylate-atropine (LOMOTIL) 2.5-0.025 MG tablet Take 1 tablet by mouth 4 (four) times daily as needed for diarrhea or loose stools. 30 tablet 0  . fexofenadine (ALLEGRA) 180 MG tablet Take 180 mg by mouth daily as needed (nasal drainage).    . fluticasone (FLONASE) 50  MCG/ACT nasal spray USE 2 SPRAYS NASALLY DAILY (Patient taking differently: Place 2 sprays into both nostrils daily. ) 48 g 3  . furosemide (LASIX) 20 MG tablet Take 1 tablet (20 mg total) by mouth daily as needed. 30 tablet 5  . metFORMIN (GLUCOPHAGE) 500 MG tablet TAKE 1 TABLET TWICE A DAY  WITH MEALS (Patient taking differently: Take 500 mg by mouth 2 (two) times daily. ) 180 tablet 0  . mometasone-formoterol (DULERA) 200-5 MCG/ACT AERO Take 2 puffs first thing in am and then another 2 puffs about 12 hours later. (Patient taking differently: Inhale 2 puffs into the lungs 2 (two) times daily. Take 2 puffs first thing in am and then another 2 puffs about 12 hours later.) 3 Inhaler 3  . nystatin (MYCOSTATIN) 100000 UNIT/ML suspension Take 5 mLs (500,000 Units total) by mouth 4 (four) times daily. 60 mL 0  . Omega-3 Fatty Acids (FISH OIL) 1200 MG CAPS Take 1,200 mg by mouth daily.     . ondansetron (ZOFRAN) 4 MG tablet Take 1 tablet (4 mg total) by mouth every 6 (six) hours as needed for nausea or vomiting. 60 tablet 0  . pravastatin (PRAVACHOL) 40 MG tablet TAKE 1 TABLET DAILY 90 tablet 0  . pregabalin (LYRICA) 75 MG capsule Take 1 capsule (75 mg total) by mouth 3 (three) times daily. (Patient taking differently: Take 75 mg by mouth 2 (two) times daily. ) 270 capsule 3  . PROAIR HFA 108 (90 Base) MCG/ACT inhaler USE FOUR TIMES DAILY AS NEEDED  FOR ASTHMA OR SHORTNESS OF BREATH 18 g 1  . QUEtiapine (SEROQUEL) 25 MG tablet Take 0.5 tablets (12.5 mg total) by mouth at bedtime as needed (agitation). 30 tablet 0  . tiotropium (SPIRIVA HANDIHALER) 18 MCG inhalation capsule INHALE THE CONTENTS OF ONE CAPSULE VIA HANDIHALER     DAILY 90 capsule 3   No current facility-administered medications on file prior to visit.     Allergies  Allergen Reactions  . Budesonide-Formoterol Fumarate Other (See Comments)    (Symbicort) coughed worse  . Lipitor [Atorvastatin] Other (See Comments)    Nosebleeds  .  Penicillins Rash    Did it involve swelling of the face/tongue/throat, SOB, or low BP? No Did it involve sudden or severe rash/hives, skin peeling, or any reaction on the inside of your mouth or nose? No Did you need to seek medical attention at a hospital or doctor's office? No When did it last happen?childhood allergy If all above answers are "NO", may proceed with cephalosporin use.     Past Medical History:  Diagnosis Date  . Allergy   . Anemia   . Anxiety   . Asthma   . Blood transfusion without reported diagnosis   . Breast cancer [facility])    h/o- radiation  . Carcinoid tumor of lung 1991  . Cataract    removed x 1   . COPD (chronic obstructive pulmonary disease) ([place])   . Depression   . Diabetes mellitus type II   . Diverticulosis   . Dyspnea    with exertion   . Gastroparesis   . GERD (gastroesophageal reflux disease)   . History of adenomatous polyp of colon   . History of breast cancer   . History of secondary kidney cancer   . Hyperlipidemia   . Liver cancer ([place])   . Mild cognitive impairment   . Neuromuscular disorder (HCC)    neuropathy   . Osteoarthritis   . Oxygen deficiency    2 liters at bedtime   . Pneumonia   . Pulmonary nodule    multiple  . Renal cell carcinoma     Past Surgical History:  Procedure Laterality Date  . BIOPSY  05/16/2018   Procedure: BIOPSY;  Surgeon: Rush Landmark Telford Nab., MD;  Location: [facility];  Service: Gastroenterology;;  . BREAST LUMPECTOMY  1998   right breast  . CHOLECYSTECTOMY  1975  . COLONOSCOPY    . COLONOSCOPY WITH PROPOFOL N/A 05/16/2018   Procedure: COLONOSCOPY WITH PROPOFOL;  Surgeon: Rush Landmark Telford Nab., MD;  Location: [facility];  Service: Gastroenterology;  Laterality: N/A;  . HEMOSTASIS CLIP PLACEMENT  05/16/2018   Procedure: HEMOSTASIS CLIP PLACEMENT;  Surgeon: Irving Copas., MD;  Location: [facility];  Service: Gastroenterology;;  . HIP FRACTURE SURGERY Left 03/2018  . HIP  PINNING,CANNULATED Left 04/06/2018   Procedure: CANNULATED SCREWS/HIP PINNING LEFT HIP;  Surgeon: Mcarthur Rossetti, MD;  Location: [facility];  Service: Orthopedics;  Laterality: Left;  . LAPAROSCOPIC NEPHRECTOMY  01/2009   right  . LUNG SURGERY  1994   carcinoid removal  . POLYPECTOMY    . POLYPECTOMY  05/16/2018   Procedure: POLYPECTOMY;  Surgeon: Mansouraty, Telford Nab., MD;  Location: [facility];  Service: Gastroenterology;;  . TONSILLECTOMY AND ADENOIDECTOMY  1953    Family History  Problem Relation Age of Onset  . Colon cancer Father   . Lung cancer Father   . Stroke Mother   . Breast cancer Neg Hx   . Ovarian cancer Neg Hx   .  Diabetes Neg Hx   . Colon polyps Neg Hx   . Esophageal cancer Neg Hx   . Rectal cancer Neg Hx   . Stomach cancer Neg Hx     Social History   Socioeconomic History  . Marital status: Married    Spouse name: Not on file  . Number of children: 2  . Years of education: Not on file  . Highest education level: Not on file  Occupational History  . Occupation: retired Designer, jewellery: RETIRED  Social Needs  . Financial resource strain: Not on file  . Food insecurity:    Worry: Not on file    Inability: Not on file  . Transportation needs:    Medical: Not on file    Non-medical: Not on file  Tobacco Use  . Smoking status: Former Smoker    Packs/day: 1.00    Years: 30.00    Pack years: 30.00    Types: Cigarettes    Last attempt to quit: 03/08/1993    Years since quitting: 25.3  . Smokeless tobacco: Never Used  Substance and Sexual Activity  . Alcohol use: No  . Drug use: No  . Sexual activity: Not Currently    Birth control/protection: Post-menopausal  Lifestyle  . Physical activity:    Days per week: Not on file    Minutes per session: Not on file  . Stress: Not on file  Relationships  . Social connections:    Talks on phone: Not on file    Gets together: Not on file    Attends religious service: Not on file    Active  member of club or organization: Not on file    Attends meetings of clubs or organizations: Not on file    Relationship status: Not on file  . Intimate partner violence:    Fear of current or ex partner: Not on file    Emotionally abused: Not on file    Physically abused: Not on file    Forced sexual activity: Not on file  Other Topics Concern  . Not on file  Social History Narrative   Has living will   Requests husband as health care POA--daughter [name] would be alternate   Now has DNR-- done 2/19   No tube feeds if cognitively unaware         Review of Systems Not eating much---not really any solid food. Milk shakes, ice cream, etc (due to thrush) Doesn't feel she has lost weight Sleeping okay No pain issues other than her --takes tylenol occasionally    Objective:   Physical Exam  Constitutional: She appears well-developed. No distress.  HENT:  Some atrophic look to oral mucosa but no white patches  Neck: No thyromegaly present.  Cardiovascular: Normal rate, regular rhythm and normal heart sounds. Exam reveals no gallop.  No murmur heard. Respiratory: No respiratory distress. She has no wheezes. She has no rales.  Decreased breath sounds but clear No prolonged expiration Scant dry cough  GI: Soft. There is no abdominal tenderness.  Musculoskeletal:     Comments: Trace -1+ pitting in feet  Lymphadenopathy:    She has no cervical adenopathy.  Psychiatric: She has a normal mood and affect. Her behavior is normal.           Assessment & Plan:

## 2018-07-05 NOTE — Assessment & Plan Note (Signed)
Mild reactive depression but no MDD No treatment needed

## 2018-07-05 NOTE — Assessment & Plan Note (Signed)
Seems back to baseline Continue regular medications

## 2018-07-05 NOTE — Assessment & Plan Note (Signed)
Has decided against further therapy Now on hospice

## 2018-07-07 DIAGNOSIS — E1142 Type 2 diabetes mellitus with diabetic polyneuropathy: Secondary | ICD-10-CM | POA: Diagnosis not present

## 2018-07-07 DIAGNOSIS — N183 Chronic kidney disease, stage 3 (moderate): Secondary | ICD-10-CM | POA: Diagnosis not present

## 2018-07-07 DIAGNOSIS — S72002D Fracture of unspecified part of neck of left femur, subsequent encounter for closed fracture with routine healing: Secondary | ICD-10-CM | POA: Diagnosis not present

## 2018-07-07 DIAGNOSIS — D630 Anemia in neoplastic disease: Secondary | ICD-10-CM | POA: Diagnosis not present

## 2018-07-07 DIAGNOSIS — I129 Hypertensive chronic kidney disease with stage 1 through stage 4 chronic kidney disease, or unspecified chronic kidney disease: Secondary | ICD-10-CM | POA: Diagnosis not present

## 2018-07-07 DIAGNOSIS — L89151 Pressure ulcer of sacral region, stage 1: Secondary | ICD-10-CM | POA: Diagnosis not present

## 2018-07-07 DIAGNOSIS — Z85118 Personal history of other malignant neoplasm of bronchus and lung: Secondary | ICD-10-CM | POA: Diagnosis not present

## 2018-07-07 DIAGNOSIS — E1122 Type 2 diabetes mellitus with diabetic chronic kidney disease: Secondary | ICD-10-CM | POA: Diagnosis not present

## 2018-07-07 DIAGNOSIS — M1990 Unspecified osteoarthritis, unspecified site: Secondary | ICD-10-CM | POA: Diagnosis not present

## 2018-07-07 DIAGNOSIS — C641 Malignant neoplasm of right kidney, except renal pelvis: Secondary | ICD-10-CM | POA: Diagnosis not present

## 2018-07-07 DIAGNOSIS — Z87891 Personal history of nicotine dependence: Secondary | ICD-10-CM | POA: Diagnosis not present

## 2018-07-07 DIAGNOSIS — J302 Other seasonal allergic rhinitis: Secondary | ICD-10-CM | POA: Diagnosis not present

## 2018-07-07 DIAGNOSIS — C787 Secondary malignant neoplasm of liver and intrahepatic bile duct: Secondary | ICD-10-CM | POA: Diagnosis not present

## 2018-07-07 DIAGNOSIS — Z9981 Dependence on supplemental oxygen: Secondary | ICD-10-CM | POA: Diagnosis not present

## 2018-07-07 DIAGNOSIS — J9621 Acute and chronic respiratory failure with hypoxia: Secondary | ICD-10-CM | POA: Diagnosis not present

## 2018-07-07 DIAGNOSIS — J189 Pneumonia, unspecified organism: Secondary | ICD-10-CM | POA: Diagnosis not present

## 2018-07-07 DIAGNOSIS — K219 Gastro-esophageal reflux disease without esophagitis: Secondary | ICD-10-CM | POA: Diagnosis not present

## 2018-07-07 DIAGNOSIS — I2510 Atherosclerotic heart disease of native coronary artery without angina pectoris: Secondary | ICD-10-CM | POA: Diagnosis not present

## 2018-07-07 DIAGNOSIS — E785 Hyperlipidemia, unspecified: Secondary | ICD-10-CM | POA: Diagnosis not present

## 2018-07-07 DIAGNOSIS — Z853 Personal history of malignant neoplasm of breast: Secondary | ICD-10-CM | POA: Diagnosis not present

## 2018-07-07 DIAGNOSIS — J449 Chronic obstructive pulmonary disease, unspecified: Secondary | ICD-10-CM | POA: Diagnosis not present

## 2018-07-07 DIAGNOSIS — F419 Anxiety disorder, unspecified: Secondary | ICD-10-CM | POA: Diagnosis not present

## 2018-07-10 DIAGNOSIS — J449 Chronic obstructive pulmonary disease, unspecified: Secondary | ICD-10-CM | POA: Diagnosis not present

## 2018-07-10 DIAGNOSIS — I129 Hypertensive chronic kidney disease with stage 1 through stage 4 chronic kidney disease, or unspecified chronic kidney disease: Secondary | ICD-10-CM | POA: Diagnosis not present

## 2018-07-10 DIAGNOSIS — J9621 Acute and chronic respiratory failure with hypoxia: Secondary | ICD-10-CM | POA: Diagnosis not present

## 2018-07-10 DIAGNOSIS — C787 Secondary malignant neoplasm of liver and intrahepatic bile duct: Secondary | ICD-10-CM | POA: Diagnosis not present

## 2018-07-10 DIAGNOSIS — J189 Pneumonia, unspecified organism: Secondary | ICD-10-CM | POA: Diagnosis not present

## 2018-07-10 DIAGNOSIS — C641 Malignant neoplasm of right kidney, except renal pelvis: Secondary | ICD-10-CM | POA: Diagnosis not present

## 2018-07-13 ENCOUNTER — Telehealth: Payer: Self-pay | Admitting: Oncology

## 2018-07-13 NOTE — Telephone Encounter (Signed)
Called regarding upcoming [facility] appointment, patient's daughter will help set it up. E-mail sent.

## 2018-07-14 NOTE — Telephone Encounter (Signed)
Opened by accident

## 2018-07-17 ENCOUNTER — Inpatient Hospital Stay: Payer: Medicare Other | Attending: Oncology | Admitting: Oncology

## 2018-07-17 NOTE — Progress Notes (Signed)
I contacted Mr. [name] over the phone today.  Mrs. [name] is not able to participate in virtual visit today.  She has decided against continuing anticancer therapy and proceed with supportive care only.  She took a Sutent for a few days and was hospitalized with pneumonia subsequently.  I agree with the decision at this time to proceed with supportive management with consideration of hospice of her condition declines.  We will cancel any future visits at this time and I am happy to readdress this issue with them in the future. This encounter was created in error - please disregard.

## 2018-07-22 ENCOUNTER — Telehealth: Payer: Self-pay | Admitting: Family Medicine

## 2018-07-22 NOTE — Telephone Encounter (Signed)
[facility] hospice RN called for on call provider.  Pt with chills, R lower leg erythema, and increased warmth from dorsum of foot to ankle.  Pt's family member tried to elevate her foot, but symptoms continued.    Allergies reviewed.  Given concern for cellulitis will start doxycycline 100 mg BID x 7 days.  Hospice RN to re-evaluate regularly.  F/u with pcp next wk.  Grier Mitts, MD 7:15 PM 07/22/18

## 2018-07-24 ENCOUNTER — Telehealth: Payer: Self-pay

## 2018-07-24 NOTE — Telephone Encounter (Signed)
No phone number was provided to reach out to the RN. She would have to be seen by someone else in the office later this week. Can you reach out to her or the family and see where she can be scheduled if needed. Thanks

## 2018-07-24 NOTE — Telephone Encounter (Signed)
[place] Night - Client Nonclinical Telephone Record [place] Night - Client Client Site [place] - Night Physician Viviana Simpler - MD Contact Type Call Call Sonterra Page Now Who Is [place] / [place] Name Amaya Name [place] Number [phone] Patient Name [name] Patient DOB 23-Oct-1945 Reason for Call Symptomatic Patient Initial Comment Caller is a homecare RN and is wanting to speak to the doctor about her patient/. She has cellulitis all down her lower right leg and was wanting to know if an Rx can be called in for her. No fever but she is shivering. Additional Comment Paging DoctorName Phone DateTime Result/Outcome Message Type Notes Grier Mitts- MD [phone] 07/22/2018 7:06:29 PM Called On Call Provider - Reached Doctor Paged Grier Mitts- MD 07/22/2018 7:09:41 PM Spoke with On Call - General Message Result Call Closed By: Carleene Mains

## 2018-07-24 NOTE — Telephone Encounter (Signed)
I spoke to patient's husband.  He said patient is taking the antibiotics and it's helping.  He said the hospice nurse is coming to the house this week.  He said he'll discuss it with the hospice nurse and if she feels patient needs an appointment, he'll call back to schedule virtual office visit.

## 2018-07-24 NOTE — Telephone Encounter (Signed)
Please refer to 07/22/18 phone encounter by on call provider who addressed these concerns.

## 2018-07-25 NOTE — Telephone Encounter (Signed)
Please check to make sure her leg is improving. If not, I would treat with levaquin 500mg  daily for 5 days

## 2018-07-25 NOTE — Telephone Encounter (Signed)
Left message with Hospice Nurse to find out how her leg is doing.

## 2018-07-26 NOTE — Telephone Encounter (Signed)
I did leave a message for the Home health nurse to please call us back with an update as soon as possible with patient's condition.  Husband reported on 07/24/18 (refer to 2nd phone encounter documentation) that she is taking antibiotic and appears to be improving but will talk to the home health nurse and have her call us.   Still waiting on response from the nurse.

## 2018-07-27 DIAGNOSIS — I129 Hypertensive chronic kidney disease with stage 1 through stage 4 chronic kidney disease, or unspecified chronic kidney disease: Secondary | ICD-10-CM | POA: Diagnosis not present

## 2018-07-27 DIAGNOSIS — J189 Pneumonia, unspecified organism: Secondary | ICD-10-CM | POA: Diagnosis not present

## 2018-07-27 DIAGNOSIS — J449 Chronic obstructive pulmonary disease, unspecified: Secondary | ICD-10-CM | POA: Diagnosis not present

## 2018-07-27 DIAGNOSIS — C641 Malignant neoplasm of right kidney, except renal pelvis: Secondary | ICD-10-CM | POA: Diagnosis not present

## 2018-07-27 DIAGNOSIS — J9621 Acute and chronic respiratory failure with hypoxia: Secondary | ICD-10-CM | POA: Diagnosis not present

## 2018-07-27 DIAGNOSIS — C787 Secondary malignant neoplasm of liver and intrahepatic bile duct: Secondary | ICD-10-CM | POA: Diagnosis not present

## 2018-07-28 ENCOUNTER — Other Ambulatory Visit: Payer: Self-pay | Admitting: Internal Medicine

## 2018-08-01 MED ORDER — PREGABALIN 75 MG PO CAPS: 75.0000 mg | ORAL_CAPSULE | Freq: Two times a day (BID) | ORAL | 1 refills | Status: AC

## 2018-08-02 DIAGNOSIS — J9621 Acute and chronic respiratory failure with hypoxia: Secondary | ICD-10-CM | POA: Diagnosis not present

## 2018-08-02 DIAGNOSIS — J449 Chronic obstructive pulmonary disease, unspecified: Secondary | ICD-10-CM | POA: Diagnosis not present

## 2018-08-02 DIAGNOSIS — I129 Hypertensive chronic kidney disease with stage 1 through stage 4 chronic kidney disease, or unspecified chronic kidney disease: Secondary | ICD-10-CM | POA: Diagnosis not present

## 2018-08-02 DIAGNOSIS — C641 Malignant neoplasm of right kidney, except renal pelvis: Secondary | ICD-10-CM | POA: Diagnosis not present

## 2018-08-02 DIAGNOSIS — C787 Secondary malignant neoplasm of liver and intrahepatic bile duct: Secondary | ICD-10-CM | POA: Diagnosis not present

## 2018-08-02 DIAGNOSIS — J189 Pneumonia, unspecified organism: Secondary | ICD-10-CM | POA: Diagnosis not present

## 2018-08-06 DIAGNOSIS — J189 Pneumonia, unspecified organism: Secondary | ICD-10-CM | POA: Diagnosis not present

## 2018-08-06 DIAGNOSIS — J9621 Acute and chronic respiratory failure with hypoxia: Secondary | ICD-10-CM | POA: Diagnosis not present

## 2018-08-06 DIAGNOSIS — C641 Malignant neoplasm of right kidney, except renal pelvis: Secondary | ICD-10-CM | POA: Diagnosis not present

## 2018-08-06 DIAGNOSIS — C787 Secondary malignant neoplasm of liver and intrahepatic bile duct: Secondary | ICD-10-CM | POA: Diagnosis not present

## 2018-08-06 DIAGNOSIS — I129 Hypertensive chronic kidney disease with stage 1 through stage 4 chronic kidney disease, or unspecified chronic kidney disease: Secondary | ICD-10-CM | POA: Diagnosis not present

## 2018-08-06 DIAGNOSIS — J449 Chronic obstructive pulmonary disease, unspecified: Secondary | ICD-10-CM | POA: Diagnosis not present

## 2018-08-07 ENCOUNTER — Telehealth: Payer: Self-pay | Admitting: Family Medicine

## 2018-08-07 NOTE — Telephone Encounter (Addendum)
NOTE: Please see on call physician's orders below addressing this call over the weekend.  Patient has since passed away and PCP is aware.     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 States she needs morphine called in for the patient. Additional Comment Paging DoctorName Phone DateTime Result/Outcome Message Type Notes Dimple Nanas- MD [phone] 08-09-18 1:31:23 PM Paged On Call Back to Call Center Doctor Paged Please call Amy w/Access Nurse @ [phone]. Thanks! Dimple Nanas- MD [id number] Aug 09, 2018 1:51:56 PM Called On Call Provider - Reached Doctor Paged Dimple Nanas- MD 08/09/2018 1:52:05 PM Spoke with On Call - General Message Result Call Closed By: Philis Kendall Transaction Date/Time: 08/09/18 1:14:01 PM (ET)

## 2018-08-07 NOTE — Telephone Encounter (Signed)
Now notified she has died Will call husband to offer my condolences

## 2018-08-07 NOTE — Telephone Encounter (Signed)
Late Entry- April from Hospice called this weekend asking for [facility] for pt's air hunger.  I gave verbal OK but told her I had no way to provide hard script to pharmacy.  April indicated that Hospice NP will provide script now that she has verbal order to implement protocol.

## 2018-08-07 DEATH — deceased

## 2018-08-16 ENCOUNTER — Encounter: Payer: Medicare Other | Admitting: Internal Medicine

## 2020-03-21 IMAGING — MG DIGITAL SCREENING BILATERAL MAMMOGRAM WITH TOMO AND CAD
6 of 10 series · 6 of 30 positions shown · non-contrast
Comparison: Previous exam(s).

CLINICAL DATA: Screening.

EXAM:
DIGITAL SCREENING BILATERAL MAMMOGRAM WITH TOMO AND CAD

[L CC synth-2D]
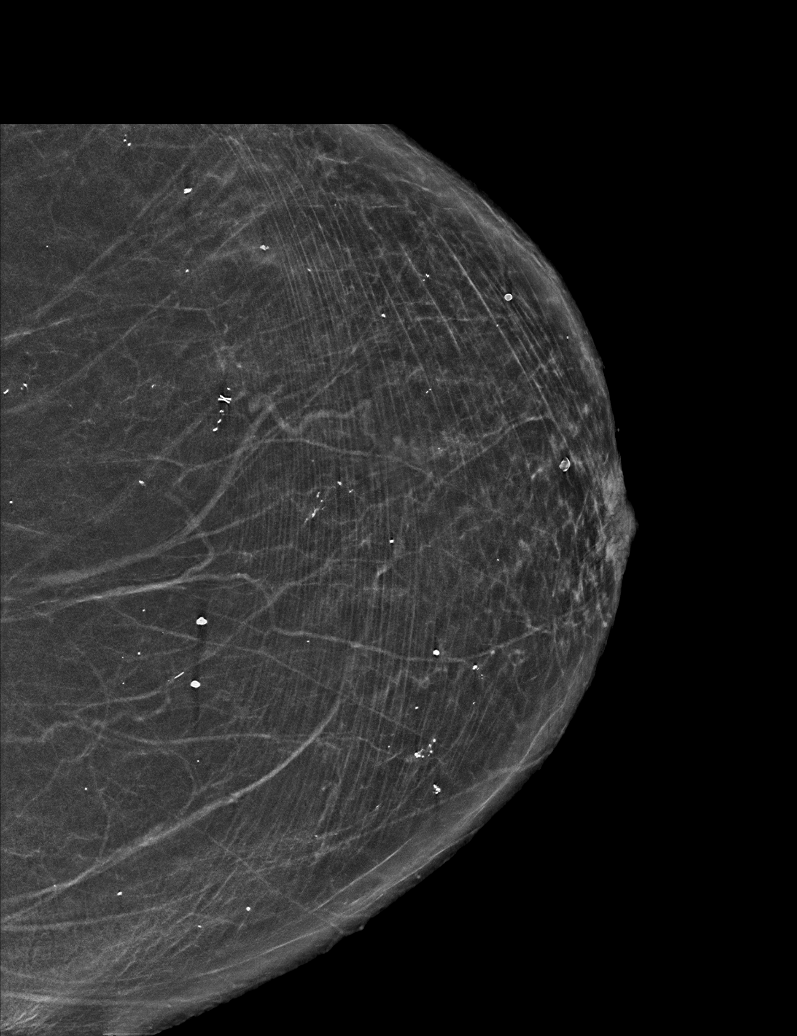

[R MLO synth-2D]
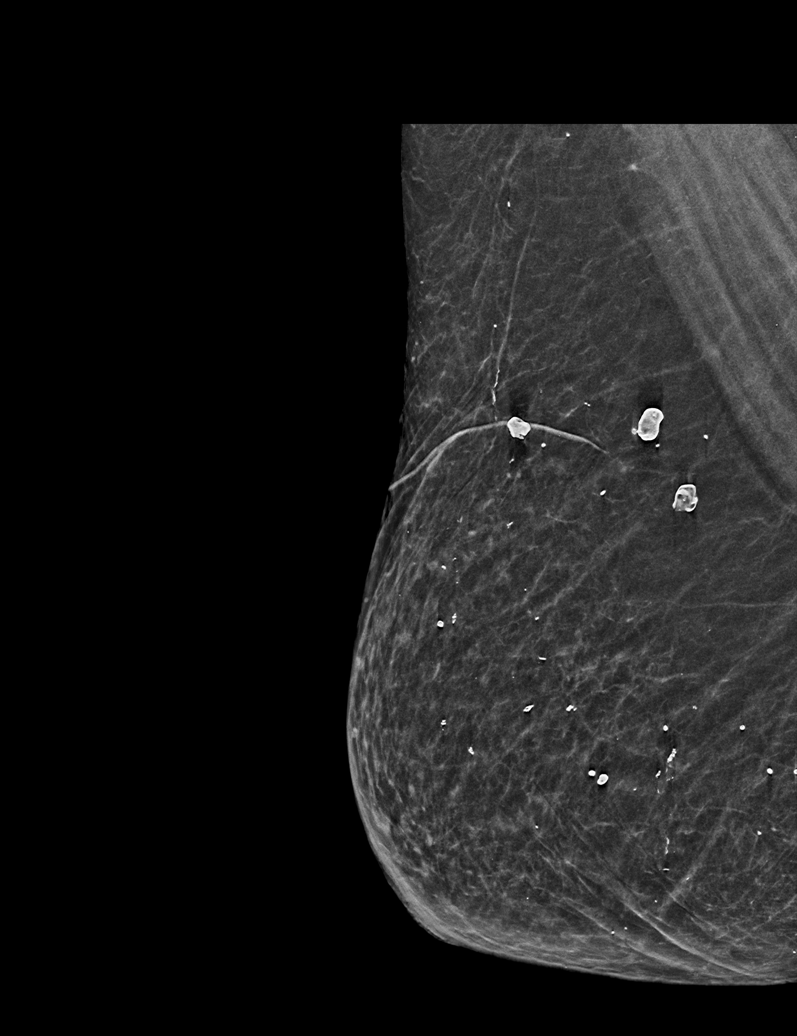

[L CV synth-2D]
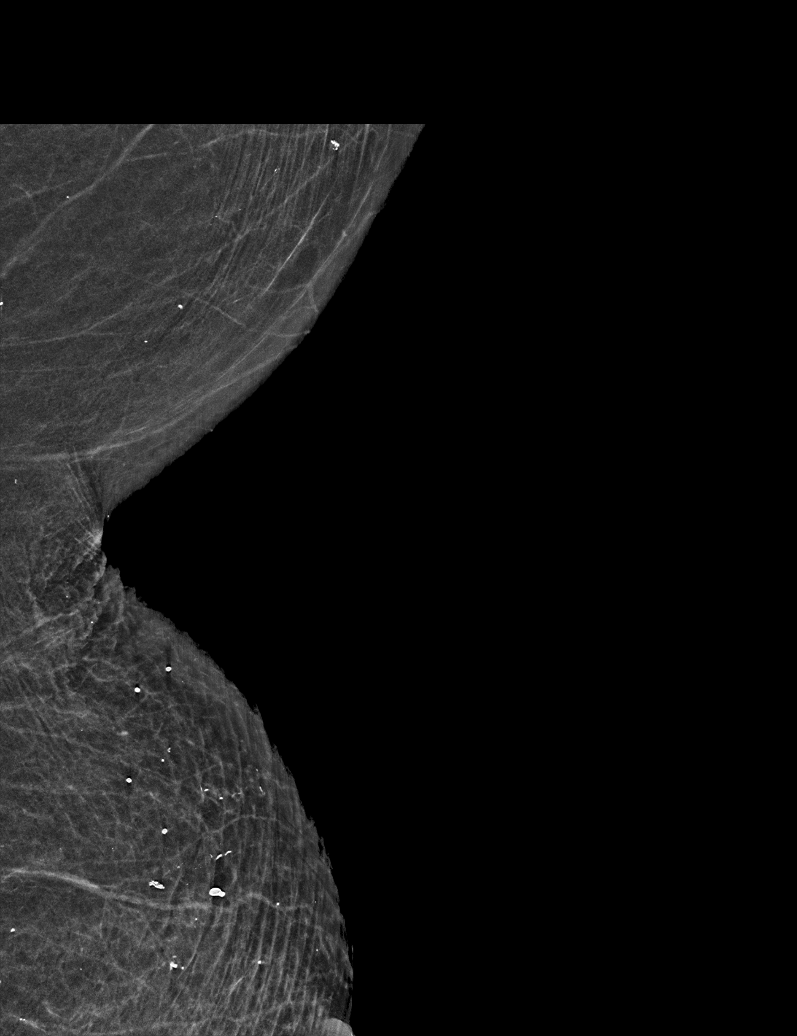

[R CC synth-2D]
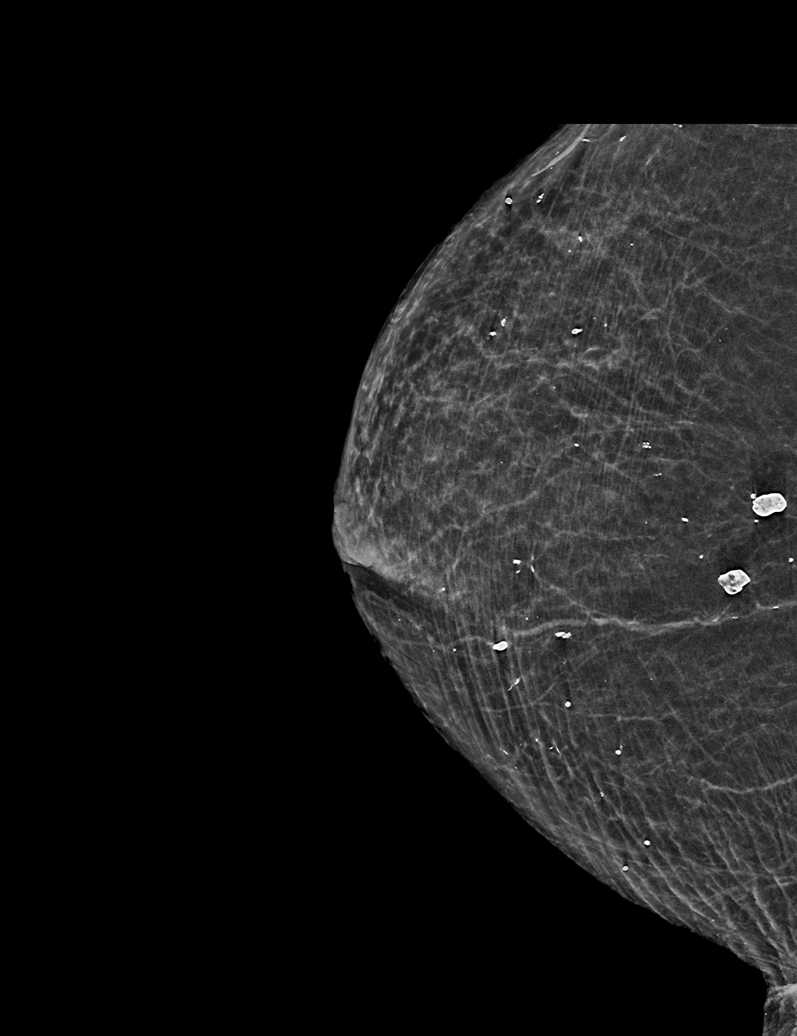

[L MLO synth-2D]
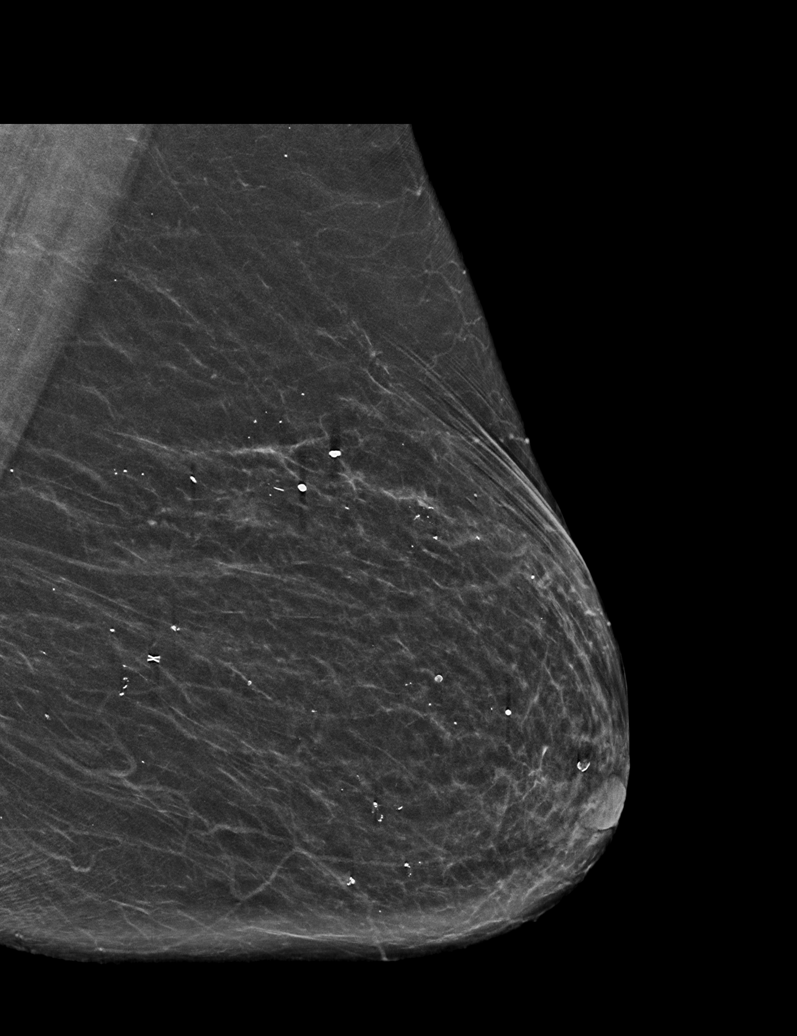

[R CC tomo · tomo slice 21/40.0]
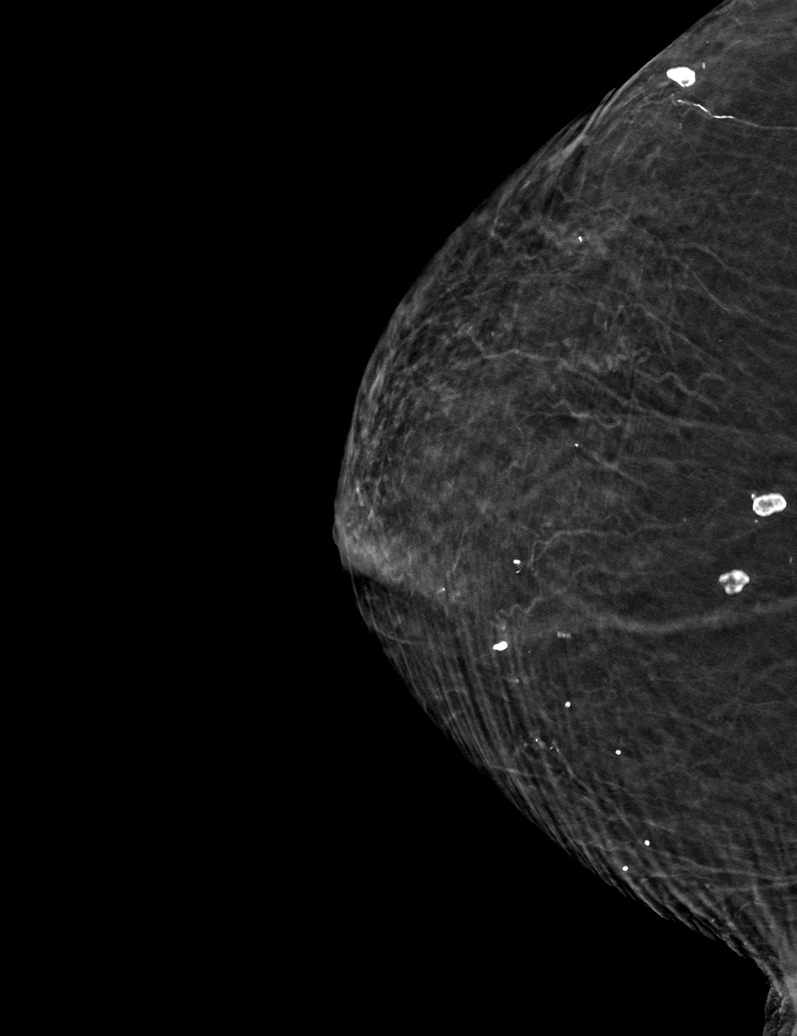

[6 of 30 positions shown; findings below may reference images not displayed]

ACR Breast Density Category b: There are scattered areas of
fibroglandular density.
FINDINGS: There are no findings suspicious for malignancy. Images were
processed with CAD.
IMPRESSION: No mammographic evidence of malignancy. A result letter of this
screening mammogram will be mailed directly to the patient.

RECOMMENDATION:
Screening mammogram in one year. (Code:[id number])

BI-RADS CATEGORY  1: Negative.
